# Patient Record
Sex: Female | Born: 1975 | Race: White | Hispanic: No | Marital: Married | Smoking: Former smoker
Health system: Southern US, Community
[De-identification: ages and names within clinical notes are randomized; demographics above are authoritative.]

## PROBLEM LIST (undated history)

## (undated) DIAGNOSIS — R0902 Hypoxemia: Secondary | ICD-10-CM

## (undated) DIAGNOSIS — F32A Depression, unspecified: Secondary | ICD-10-CM

## (undated) DIAGNOSIS — I10 Essential (primary) hypertension: Secondary | ICD-10-CM

## (undated) DIAGNOSIS — E119 Type 2 diabetes mellitus without complications: Secondary | ICD-10-CM

## (undated) DIAGNOSIS — I509 Heart failure, unspecified: Secondary | ICD-10-CM

## (undated) DIAGNOSIS — F329 Major depressive disorder, single episode, unspecified: Secondary | ICD-10-CM

## (undated) DIAGNOSIS — F419 Anxiety disorder, unspecified: Secondary | ICD-10-CM

## (undated) DIAGNOSIS — G4730 Sleep apnea, unspecified: Secondary | ICD-10-CM

## (undated) HISTORY — PX: CHOLECYSTECTOMY: SHX55

## (undated) HISTORY — PX: TUBAL LIGATION: SHX77

## (undated) HISTORY — PX: INCONTINENCE SURGERY: SHX676

## (undated) HISTORY — DX: Hypoxemia: R09.02

## (undated) HISTORY — DX: Sleep apnea, unspecified: G47.30

## (undated) HISTORY — PX: OOPHORECTOMY: SHX86

---

## 2003-10-06 ENCOUNTER — Other Ambulatory Visit: Payer: Self-pay

## 2004-08-11 ENCOUNTER — Emergency Department: Payer: Self-pay | Admitting: Emergency Medicine

## 2004-08-18 ENCOUNTER — Emergency Department: Payer: Self-pay | Admitting: Emergency Medicine

## 2005-03-06 ENCOUNTER — Emergency Department: Payer: Self-pay | Admitting: Emergency Medicine

## 2006-10-16 ENCOUNTER — Other Ambulatory Visit: Payer: Self-pay

## 2006-10-16 ENCOUNTER — Emergency Department: Payer: Self-pay | Admitting: Emergency Medicine

## 2006-11-17 ENCOUNTER — Emergency Department: Payer: Self-pay | Admitting: Emergency Medicine

## 2007-04-25 ENCOUNTER — Emergency Department: Payer: Self-pay | Admitting: Emergency Medicine

## 2007-04-25 ENCOUNTER — Other Ambulatory Visit: Payer: Self-pay

## 2007-04-28 ENCOUNTER — Emergency Department: Payer: Self-pay | Admitting: Emergency Medicine

## 2007-08-31 ENCOUNTER — Emergency Department: Payer: Self-pay | Admitting: Emergency Medicine

## 2008-01-22 ENCOUNTER — Emergency Department: Payer: Self-pay | Admitting: Emergency Medicine

## 2008-05-02 ENCOUNTER — Emergency Department: Payer: Self-pay | Admitting: Emergency Medicine

## 2008-05-11 ENCOUNTER — Emergency Department: Payer: Self-pay | Admitting: Emergency Medicine

## 2008-09-21 IMAGING — CT CT CHEST W/ CM
2 series · 16 of 31 positions shown, 20 images · non-contrast
Comparison: none

REASON FOR EXAM: chest pain   cough   [HOSPITAL]
COMMENTS:  LMP: Now

PROCEDURE:     CT  - CT CHEST (FOR PE) W  - October 17, 2006  [DATE]
RESULT:     Emergent chest CT is performed for chest pain and elevated
D.dimer.

[Series 6: soft tissue · axial · 0.86mm/px · z∈[-538,-442]mm · 3 of 103 slices shown]
[im 8/103  mediastinal]
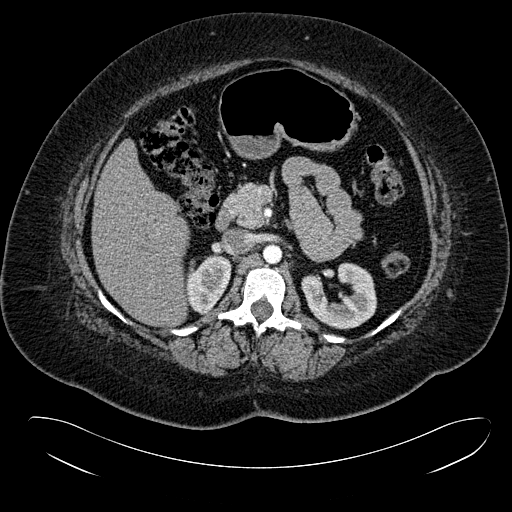
[im 24/103  mediastinal]
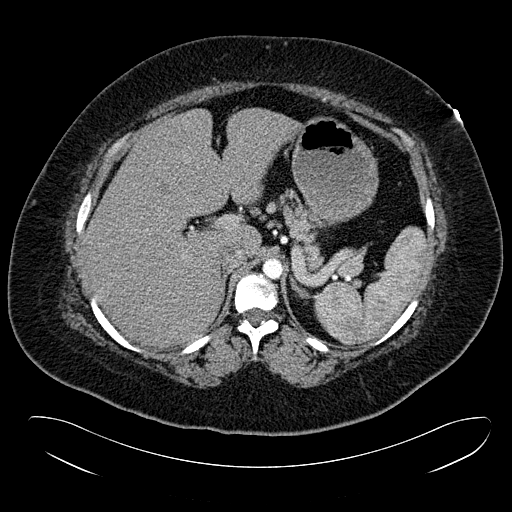
[im 40/103  mediastinal]
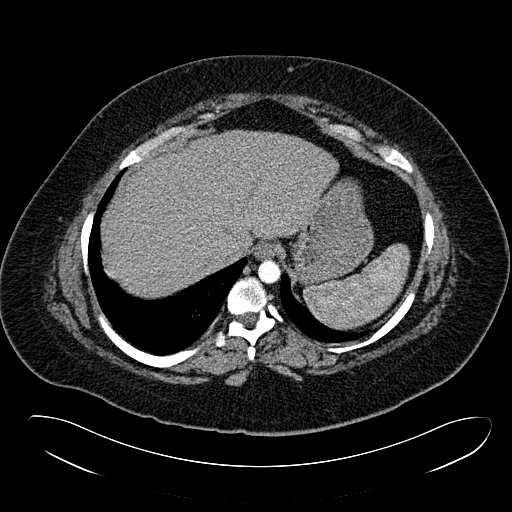

[Series 7: lung windows · axial · 0.86mm/px · z∈[-528,-276]mm · 13 of 100 slices shown, 17 images]
[im 8/100  mediastinal]
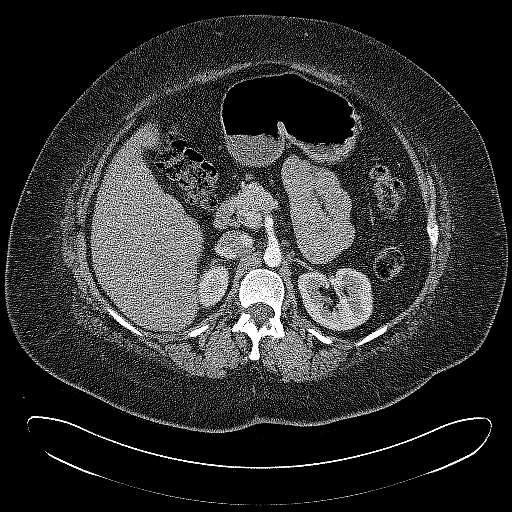
[im 8/100  lung]
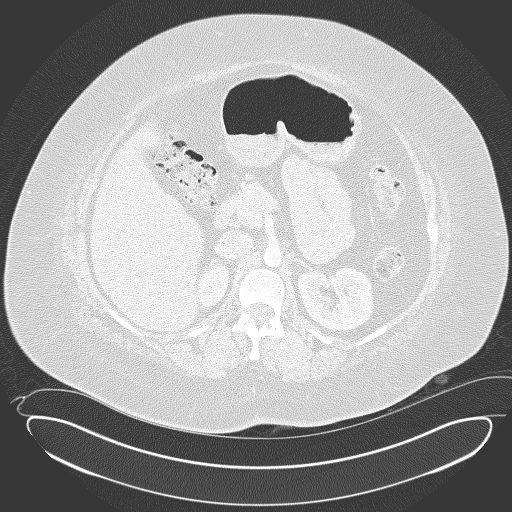
[im 16/100  lung]
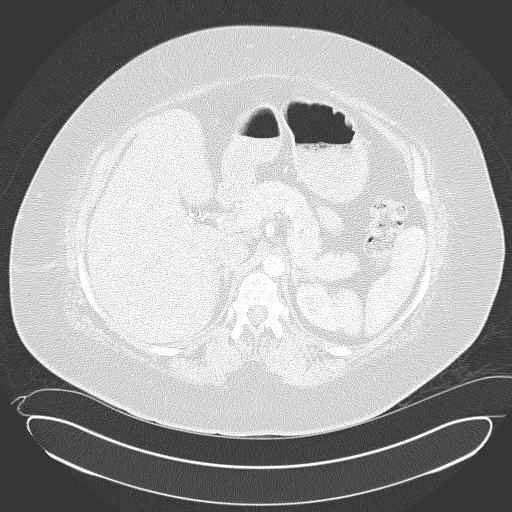
[im 23/100  lung]
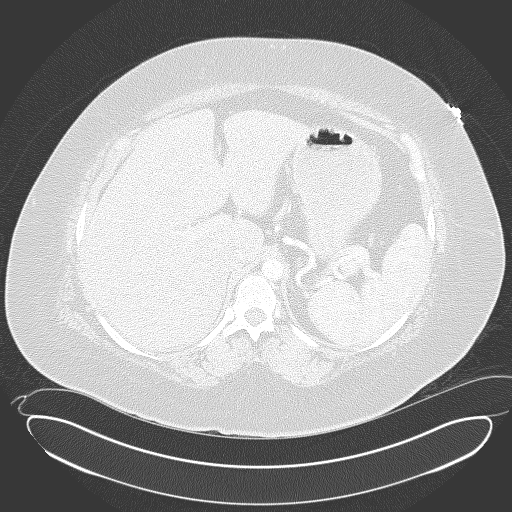
[im 31/100  lung]
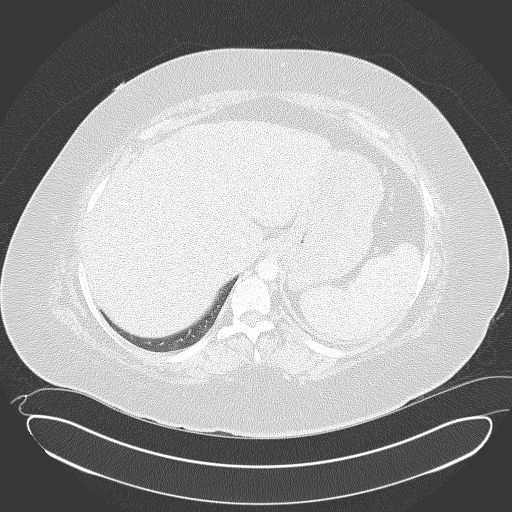
[im 39/100  mediastinal]
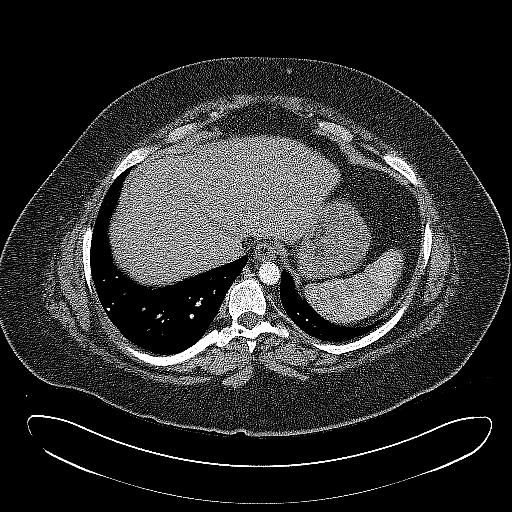
[im 39/100  lung]
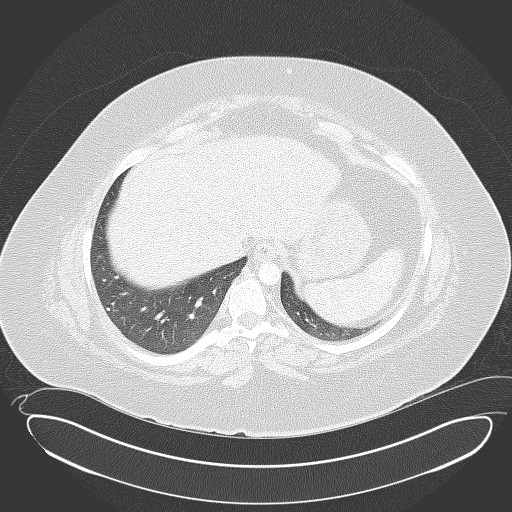
[im 46/100  lung]
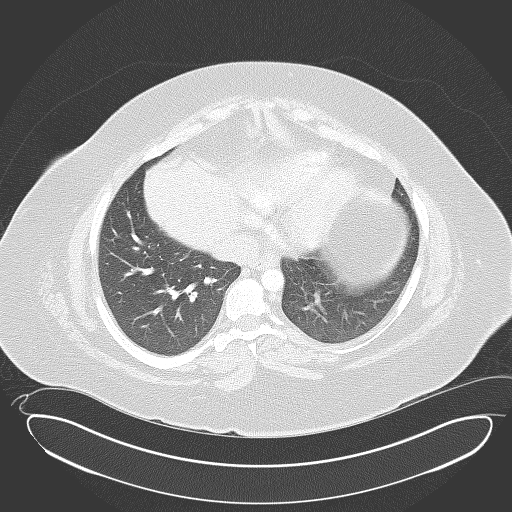
[im 50/100  lung]
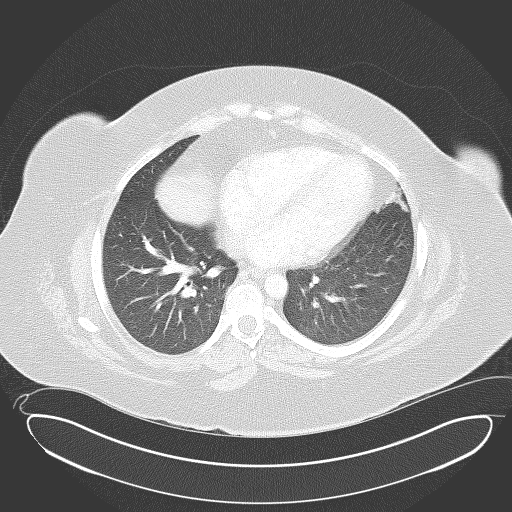
[im 54/100  lung]
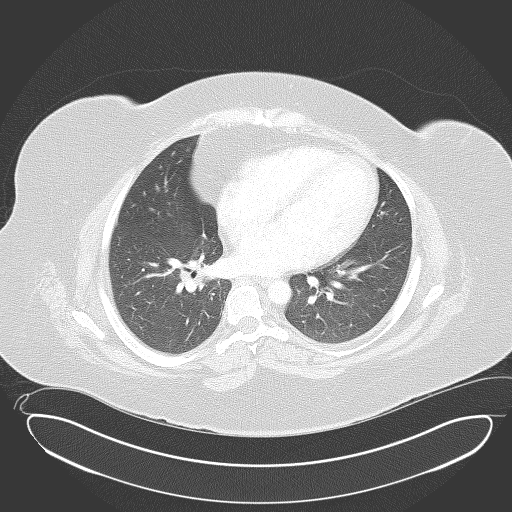
[im 61/100  mediastinal]
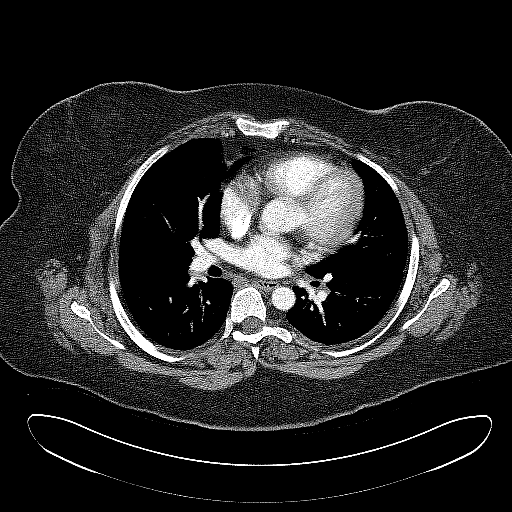
[im 61/100  lung]
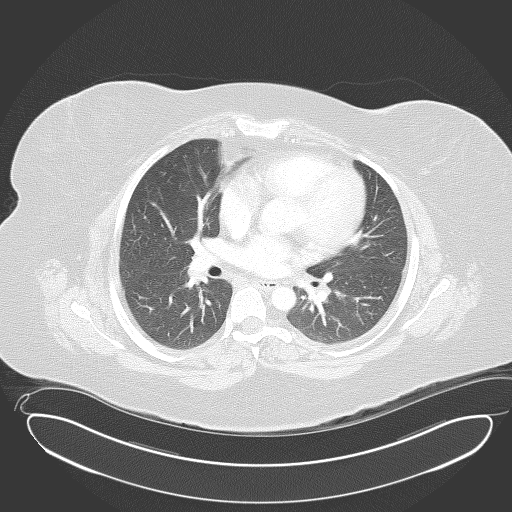
[im 69/100  lung]
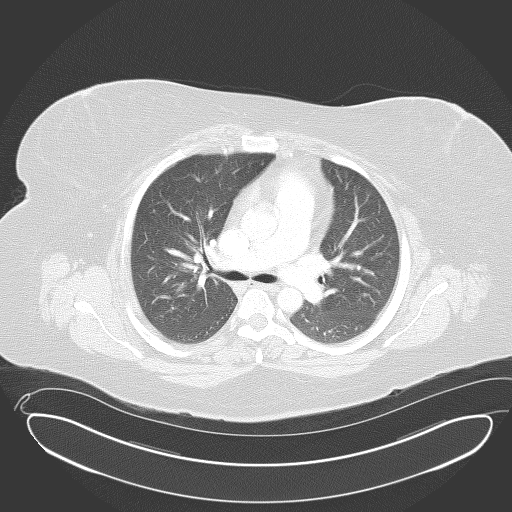
[im 77/100  lung]
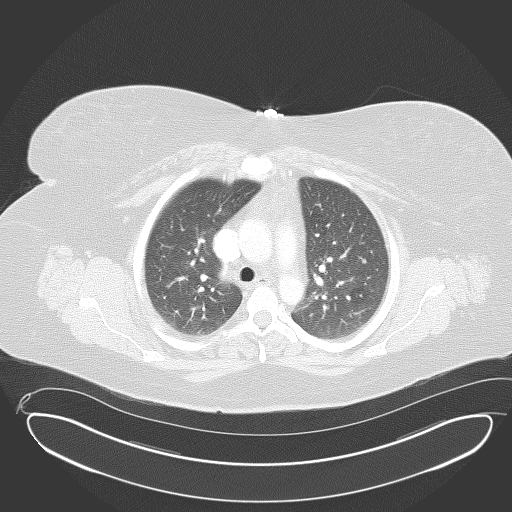
[im 84/100  lung]
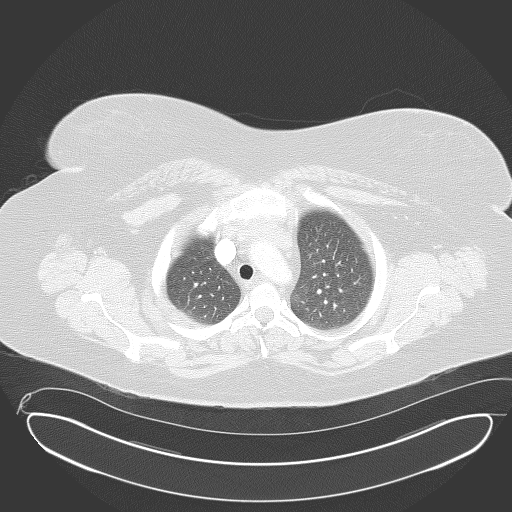
[im 92/100  mediastinal]
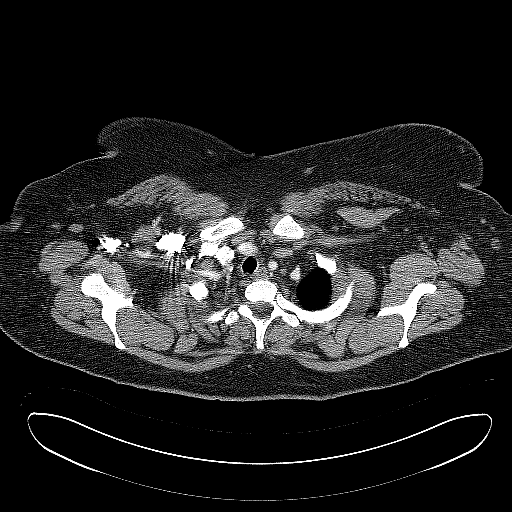
[im 92/100  lung]
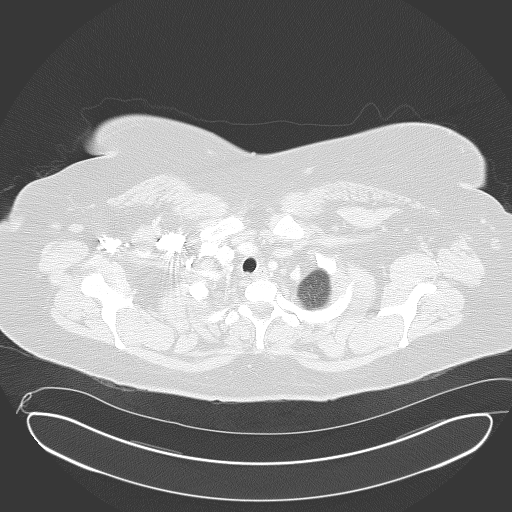

[16 of 31 positions shown; findings below may reference images not displayed]

FINDINGS: The bolus of contrast is noted within the pulmonary arteries.  No
filling defects are identified to suggest pulmonary emboli.  No mediastinal
masses are noted.  The thoracic aorta appears intact. On the lung windows
settings the lung fields are clear with no effusions.
IMPRESSION: 1)No pulmonary embolus identified.

The report was faxed to the Emergency Room.

## 2008-11-17 ENCOUNTER — Emergency Department: Payer: Self-pay | Admitting: Emergency Medicine

## 2009-03-17 ENCOUNTER — Emergency Department: Payer: Self-pay | Admitting: Emergency Medicine

## 2010-02-28 ENCOUNTER — Emergency Department: Payer: Self-pay | Admitting: Emergency Medicine

## 2010-03-07 ENCOUNTER — Emergency Department: Payer: Self-pay | Admitting: Emergency Medicine

## 2010-04-01 ENCOUNTER — Emergency Department: Payer: Self-pay | Admitting: Emergency Medicine

## 2010-10-10 ENCOUNTER — Ambulatory Visit: Payer: Self-pay | Admitting: Obstetrics and Gynecology

## 2010-12-01 ENCOUNTER — Ambulatory Visit: Payer: Self-pay | Admitting: Obstetrics and Gynecology

## 2010-12-02 ENCOUNTER — Ambulatory Visit: Payer: Self-pay | Admitting: Obstetrics and Gynecology

## 2011-03-12 ENCOUNTER — Emergency Department: Payer: Self-pay | Admitting: Emergency Medicine

## 2011-07-06 ENCOUNTER — Emergency Department: Payer: Self-pay | Admitting: Emergency Medicine

## 2012-04-06 ENCOUNTER — Emergency Department: Payer: Self-pay | Admitting: Unknown Physician Specialty

## 2012-12-01 ENCOUNTER — Emergency Department: Payer: Self-pay | Admitting: Internal Medicine

## 2012-12-01 LAB — URINALYSIS, COMPLETE
Bacteria: NONE SEEN
Bilirubin,UR: NEGATIVE
Glucose,UR: NEGATIVE mg/dL (ref 0–75)
Nitrite: NEGATIVE
Ph: 5 (ref 4.5–8.0)
RBC,UR: 89 /HPF (ref 0–5)
Squamous Epithelial: 22

## 2012-12-01 LAB — BASIC METABOLIC PANEL
Anion Gap: 7 (ref 7–16)
BUN: 17 mg/dL (ref 7–18)
Co2: 26 mmol/L (ref 21–32)
Creatinine: 0.84 mg/dL (ref 0.60–1.30)
Glucose: 118 mg/dL — ABNORMAL HIGH (ref 65–99)
Osmolality: 276 (ref 275–301)
Sodium: 137 mmol/L (ref 136–145)

## 2012-12-01 LAB — CBC
HCT: 41.5 % (ref 35.0–47.0)
HGB: 13.9 g/dL (ref 12.0–16.0)
MCH: 28.7 pg (ref 26.0–34.0)
MCHC: 33.4 g/dL (ref 32.0–36.0)
MCV: 86 fL (ref 80–100)
Platelet: 320 10*3/uL (ref 150–440)
RBC: 4.83 10*6/uL (ref 3.80–5.20)
WBC: 14.1 10*3/uL — ABNORMAL HIGH (ref 3.6–11.0)

## 2013-04-13 ENCOUNTER — Emergency Department: Payer: Self-pay | Admitting: Emergency Medicine

## 2013-04-13 LAB — CBC
HCT: 36.3 % (ref 35.0–47.0)
HGB: 12.5 g/dL (ref 12.0–16.0)
MCH: 29.1 pg (ref 26.0–34.0)
MCHC: 34.5 g/dL (ref 32.0–36.0)
MCV: 85 fL (ref 80–100)
Platelet: 305 10*3/uL (ref 150–440)
RBC: 4.29 10*6/uL (ref 3.80–5.20)
RDW: 13.9 % (ref 11.5–14.5)
WBC: 14.7 10*3/uL — ABNORMAL HIGH (ref 3.6–11.0)

## 2013-04-13 LAB — BASIC METABOLIC PANEL
Anion Gap: 7 (ref 7–16)
Chloride: 105 mmol/L (ref 98–107)
Creatinine: 0.99 mg/dL (ref 0.60–1.30)
EGFR (Non-African Amer.): >60
Glucose: 115 mg/dL — ABNORMAL HIGH (ref 65–99)
Osmolality: 280 (ref 275–301)
Potassium: 3.7 mmol/L (ref 3.5–5.1)

## 2013-04-13 LAB — TROPONIN I
Troponin-I: <0.02 ng/mL
Troponin-I: <0.02 ng/mL

## 2013-04-13 LAB — CK TOTAL AND CKMB (NOT AT ARMC): CK-MB: <0.5 ng/mL — ABNORMAL LOW (ref 0.5–3.6)

## 2013-11-30 ENCOUNTER — Emergency Department: Payer: Self-pay | Admitting: Emergency Medicine

## 2015-07-05 ENCOUNTER — Emergency Department
Admission: EM | Admit: 2015-07-05 | Discharge: 2015-07-05 | Disposition: A | Discharge status: Home / Self Care | Payer: Medicaid Other | Attending: Emergency Medicine | Admitting: Emergency Medicine

## 2015-07-05 ENCOUNTER — Encounter: Payer: Self-pay | Admitting: *Deleted

## 2015-07-05 ENCOUNTER — Emergency Department: Payer: Medicaid Other

## 2015-07-05 DIAGNOSIS — R008 Other abnormalities of heart beat: Secondary | ICD-10-CM | POA: Insufficient documentation

## 2015-07-05 DIAGNOSIS — S99912A Unspecified injury of left ankle, initial encounter: Secondary | ICD-10-CM | POA: Diagnosis present

## 2015-07-05 DIAGNOSIS — Y998 Other external cause status: Secondary | ICD-10-CM | POA: Diagnosis not present

## 2015-07-05 DIAGNOSIS — S82892A Other fracture of left lower leg, initial encounter for closed fracture: Secondary | ICD-10-CM | POA: Diagnosis not present

## 2015-07-05 DIAGNOSIS — Y9389 Activity, other specified: Secondary | ICD-10-CM | POA: Diagnosis not present

## 2015-07-05 DIAGNOSIS — W108XXA Fall (on) (from) other stairs and steps, initial encounter: Secondary | ICD-10-CM | POA: Diagnosis not present

## 2015-07-05 DIAGNOSIS — S9002XA Contusion of left ankle, initial encounter: Secondary | ICD-10-CM | POA: Diagnosis not present

## 2015-07-05 DIAGNOSIS — Y9289 Other specified places as the place of occurrence of the external cause: Secondary | ICD-10-CM | POA: Diagnosis not present

## 2015-07-05 DIAGNOSIS — N390 Urinary tract infection, site not specified: Secondary | ICD-10-CM | POA: Insufficient documentation

## 2015-07-05 HISTORY — DX: Major depressive disorder, single episode, unspecified: F32.9

## 2015-07-05 HISTORY — DX: Depression, unspecified: F32.A

## 2015-07-05 HISTORY — DX: Anxiety disorder, unspecified: F41.9

## 2015-07-05 LAB — BASIC METABOLIC PANEL
ANION GAP: 5 (ref 5–15)
BUN: 13 mg/dL (ref 6–20)
CHLORIDE: 104 mmol/L (ref 101–111)
CO2: 26 mmol/L (ref 22–32)
Calcium: 9.3 mg/dL (ref 8.9–10.3)
Creatinine, Ser: 0.72 mg/dL (ref 0.44–1.00)
GFR calc non Af Amer: >60 mL/min (ref >60)
Glucose, Bld: 137 mg/dL — ABNORMAL HIGH (ref 65–99)
POTASSIUM: 4 mmol/L (ref 3.5–5.1)
Sodium: 135 mmol/L (ref 135–145)

## 2015-07-05 LAB — CBC
HCT: 39.8 % (ref 35.0–47.0)
Hemoglobin: 12.7 g/dL (ref 12.0–16.0)
MCH: 26.9 pg (ref 26.0–34.0)
MCHC: 32 g/dL (ref 32.0–36.0)
MCV: 84.2 fL (ref 80.0–100.0)
PLATELETS: 347 10*3/uL (ref 150–440)
RBC: 4.73 MIL/uL (ref 3.80–5.20)
RDW: 14.6 % — AB (ref 11.5–14.5)
WBC: 13.5 10*3/uL — AB (ref 3.6–11.0)

## 2015-07-05 LAB — URINALYSIS COMPLETE WITH MICROSCOPIC (ARMC ONLY)
Bilirubin Urine: NEGATIVE
Glucose, UA: NEGATIVE mg/dL
Hgb urine dipstick: NEGATIVE
Ketones, ur: NEGATIVE mg/dL
Nitrite: NEGATIVE
Protein, ur: NEGATIVE mg/dL
Specific Gravity, Urine: 1.023 (ref 1.005–1.030)
pH: 5 (ref 5.0–8.0)

## 2015-07-05 LAB — GLUCOSE, CAPILLARY: Glucose-Capillary: 87 mg/dL (ref 65–99)

## 2015-07-05 MED ORDER — DOCUSATE SODIUM 100 MG PO CAPS: ORAL_CAPSULE | ORAL | Status: DC

## 2015-07-05 MED ORDER — ONDANSETRON HCL 4 MG PO TABS: ORAL_TABLET | ORAL | Status: DC

## 2015-07-05 MED ORDER — LIDOCAINE HCL 1 % IJ SOLN
2.1000 mL | Freq: Once | INTRAMUSCULAR | Status: AC
  Filled 2015-07-05: qty 2.1

## 2015-07-05 MED ORDER — CEPHALEXIN 500 MG PO CAPS: 500.0000 mg | ORAL_CAPSULE | Freq: Three times a day (TID) | ORAL | Status: DC

## 2015-07-05 MED ORDER — CEFTRIAXONE SODIUM 1 G IJ SOLR
1.0000 g | Freq: Once | INTRAMUSCULAR | Status: AC
  Administered 2015-07-05: 1 g via INTRAMUSCULAR
  Filled 2015-07-05: qty 10

## 2015-07-05 MED ORDER — OXYCODONE-ACETAMINOPHEN 5-325 MG PO TABS: 1.0000 | ORAL_TABLET | ORAL | Status: DC | PRN

## 2015-07-05 MED ORDER — LIDOCAINE HCL (PF) 1 % IJ SOLN
INTRAMUSCULAR | Status: AC
  Administered 2015-07-05: 2 mL
  Filled 2015-07-05: qty 5

## 2015-07-05 MED ORDER — OXYCODONE-ACETAMINOPHEN 5-325 MG PO TABS
2.0000 | ORAL_TABLET | Freq: Once | ORAL | Status: AC
  Administered 2015-07-05: 2 via ORAL
  Filled 2015-07-05: qty 2

## 2015-07-05 NOTE — ED Provider Notes (Signed)
[facility] Emergency Department Provider Note  ____________________________________________  Time seen: Approximately 5:23 PM  I have reviewed the triage vital signs and the nursing notes.   HISTORY  Chief Complaint Fall    HPI Krista Mcgee is a 39 y.o. female with no significant past medical history other than obesity, anxiety, depression who presents with pain in her left ankle after a fall earlier today.  She fell down about 3 stairs.  She states that she became lightheaded and felt some "fluttering" in her chest that she quite frequently feels.  She denies shortness of breath, chest pain, abdominal pain, dysuria, fever/chills.  She is not sure if she passed out but her daughter was present and saw the fall and states that her mother did not lose consciousness.  She does not complain of any headache or neck pain at this time.  She has no focal neurological deficits and is only complaining of left lateral ankle pain.  There is some swelling and bruising at the site and it is painful for her to attempt to bear any weight or move the ankle.   Past Medical History  Diagnosis Date  . Anxiety   . Depression     There are no active problems to display for this patient.   Past Surgical History  Procedure Laterality Date  . Cholecystectomy    . Oophorectomy      Current Outpatient Rx  Name  Route  Sig  Dispense  Refill  . cephALEXin (KEFLEX) 500 MG capsule   Oral   Take 1 capsule (500 mg total) by mouth 3 (three) times daily.   36 capsule   0   . docusate sodium (COLACE) 100 MG capsule      Take 1 tablet once or twice daily as needed for constipation while taking narcotic pain medicine   30 capsule   0   . ondansetron (ZOFRAN) 4 MG tablet      Take 1-2 tabs by mouth every 8 hours as needed for nausea/vomiting   30 tablet   0   . oxyCODONE-acetaminophen (ROXICET) 5-325 MG tablet   Oral   Take 1-2 tablets by mouth every 4 (four) hours as  needed for severe pain.   20 tablet   0     Allergies Review of patient's allergies indicates no known allergies.  History reviewed. No pertinent family history.  Social History Social History  Substance Use Topics  . Smoking status: Never Smoker   . Smokeless tobacco: None  . Alcohol Use: No    Review of Systems Constitutional: No fever/chills Eyes: No visual changes. ENT: No sore throat. Cardiovascular: Denies chest pain.  Some fluttering in her chest which she feels frequently and describes as "butterflies" Respiratory: Denies shortness of breath. Gastrointestinal: No abdominal pain.  No nausea, no vomiting.  No diarrhea.  No constipation. Genitourinary: Negative for dysuria. Musculoskeletal: Negative for back pain.  No neck pain.  Pain and swelling to the left lateral ankle Skin: Negative for rash. Neurological: Negative for headaches, focal weakness or numbness.  10-point ROS otherwise negative.  ____________________________________________   PHYSICAL EXAM:  VITAL SIGNS: ED Triage Vitals  Enc Vitals Group     BP 07/05/15 1604 161/79 mmHg     Pulse Rate 07/05/15 1604 108     Resp 07/05/15 1604 18     Temp 07/05/15 1604 98.8 F (37.1 C)     Temp Source 07/05/15 1604 Oral     SpO2 07/05/15 1604  95 %     Weight 07/05/15 1604 370 lb (167.831 kg)     Height 07/05/15 1604 5\' 7"  (1.702 m)     Head Cir --      Peak Flow --      Pain Score 07/05/15 1601 5     Pain Loc --      Pain Edu? --      Excl. in [place]? --     Constitutional: Alert and oriented. Well appearing and in no acute distress. Eyes: Conjunctivae are normal. PERRL. EOMI. Head: Atraumatic. Nose: No congestion/rhinnorhea. Mouth/Throat: Mucous membranes are moist.  Oropharynx non-erythematous. Neck: No stridor.  No cervical spine tenderness to palpation. Cardiovascular: Normal rate, regular rhythm. Grossly normal heart sounds.  Good peripheral circulation. Respiratory: Normal respiratory effort.  No  retractions. Lungs CTAB. Gastrointestinal: Soft and nontender. No distention. No abdominal bruits. No CVA tenderness. Musculoskeletal: Pelvis stable.  No upper extremity injuries or pain/tenderness.  No tenderness to palpation of any of the left lower extremity until reaching the left lateral malleolus which has an obvious effusion, ecchymosis, and tenderness to palpation or with active and passive movement of the joint.  No open wounds.  Neurovascularly intact distally. Neurologic:  Normal speech and language. No gross focal neurologic deficits are appreciated.  Skin:  Skin is warm, dry and intact. No rash noted. Psychiatric: Mood and affect are normal. Speech and behavior are normal.  ____________________________________________   LABS (all labs ordered are listed, but only abnormal results are displayed)  Labs Reviewed  BASIC METABOLIC PANEL - Abnormal; Notable for the following:    Glucose, Bld 137 (*)    All other components within normal limits  CBC - Abnormal; Notable for the following:    WBC 13.5 (*)    RDW 14.6 (*)    All other components within normal limits  URINALYSIS COMPLETEWITH MICROSCOPIC ([facility] ONLY) - Abnormal; Notable for the following:    Color, Urine YELLOW (*)    APPearance CLOUDY (*)    Leukocytes, UA 3+ (*)    Bacteria, UA FEW (*)    Squamous Epithelial / LPF 6-30 (*)    All other components within normal limits  URINE CULTURE  GLUCOSE, CAPILLARY  CBG MONITORING, ED   ____________________________________________  EKG  ED ECG REPORT I, Josselin Gaulin, the attending physician, personally viewed and interpreted this ECG.  Date: 07/05/2015 EKG Time: 16:06 Rate: 108 Rhythm: Mild sinus tachycardia QRS Axis: normal Intervals: normal ST/T Wave abnormalities: normal Conduction Disutrbances: none Narrative Interpretation: unremarkable  ____________________________________________  RADIOLOGY   Dg Ankle Complete Left  07/05/2015  CLINICAL DATA:  Pt  fell down three steps today rolling left ankle, c/o lateral left ankle pain/swelling, no prev surgery. EXAM: LEFT ANKLE COMPLETE - 3+ VIEW COMPARISON:  None. FINDINGS: Small sliver of bone lies along the inferior margin of the distal fibula which may reflect an acute avulsion fracture or be chronic. No other evidence of a fracture. Ankle mortise is normally spaced and aligned. There is diffuse soft tissue swelling most evident laterally. There are small plantar and dorsal calcaneal spurs. IMPRESSION: 1. Possible acute, small, nondisplaced avulsion fracture from the inferior margin of the distal fibula. No other evidence of a fracture. No dislocation. Electronically Signed   By: Lajean Manes M.D.   On: 07/05/2015 17:39    ____________________________________________   PROCEDURES  Procedure(s) performed: splint, see procedure note(s).   SPLINT APPLICATION  Authorized by: Hinda Kehr Consent: Verbal consent obtained. Risks and benefits: risks, benefits  and alternatives were discussed Consent given by: patient Splint applied by: ED technician Location details: LLE Splint type: posterior short leg Supplies used: orthoglass Post-procedure: The splinted body part was neurovascularly unchanged following the procedure. Patient tolerance: Patient tolerated the procedure well with no immediate complications.     Critical Care performed: No ____________________________________________   INITIAL IMPRESSION / ASSESSMENT AND PLAN / ED COURSE  Pertinent labs & imaging results that were available during my care of the patient were reviewed by me and considered in my medical decision making (see chart for details).  The patient is well-appearing and in no acute distress.  She has slight tachycardia and her lab workup is notable for a significant urinary tract infection with too numerous to count whites as well as a mild leukocytosis.  Given how well-appearing she is at this time with normal  mental status and otherwise stable vitals, I will treat her with Rocephin IM to start treatment of her UTI, send a urine culture, and obtain x-rays for her ankle.  (Note that documentation was delayed due to multiple ED patients requiring immediate care.)   Probable avulsion fracture of distal fibula.  Patient is actually able to bear weight on the ankle.  I placed her in a splint, told to be nonweightbearing, gave crutches, treated the UTI, advised close outpatient follow-up with orthopedics.  I gave her my usual customary return precautions.  ____________________________________________  FINAL CLINICAL IMPRESSION(S) / ED DIAGNOSES  Final diagnoses:  UTI (lower urinary tract infection)  Closed left ankle fracture, initial encounter  Fall (on) (from) other stairs and steps, initial encounter      NEW MEDICATIONS STARTED DURING THIS VISIT:  Discharge Medication List as of 07/05/2015  7:32 PM    START taking these medications   Details  cephALEXin (KEFLEX) 500 MG capsule Take 1 capsule (500 mg total) by mouth 3 (three) times daily., Starting 07/05/2015, Until Discontinued, Print    docusate sodium (COLACE) 100 MG capsule Take 1 tablet once or twice daily as needed for constipation while taking narcotic pain medicine, Print    ondansetron (ZOFRAN) 4 MG tablet Take 1-2 tabs by mouth every 8 hours as needed for nausea/vomiting, Print    oxyCODONE-acetaminophen (ROXICET) 5-325 MG tablet Take 1-2 tablets by mouth every 4 (four) hours as needed for severe pain., Starting 07/05/2015, Until Discontinued, Print         Hinda Kehr, MD 07/06/15 [phone]

## 2015-07-05 NOTE — ED Notes (Signed)
Pt reports that she fell down 3 stairs at home today.  Denies hitting her head.  States that she is unsure if she passed out or not.  Reports left ankle pain since that time.

## 2015-07-05 NOTE — Discharge Instructions (Signed)
As we discussed, you may have a small fracture at the very bottom of your leg/outside portion of your ankle.  Please leave your splint in place and follow up with Dr. Rudene Christians or one of his partners next week in clinic.  He may order some repeat x-rays at that time.  Please do not bear weight on your leg and use the crutches for ambulation.  Also please take the full course of prescribed antibiotics for your urinary tract infection.  Return to the emergency department with new or worsening symptoms that concern you.  Take Percocet as prescribed for severe pain. Do not drink alcohol, drive or participate in any other potentially dangerous activities while taking this medication as it may make you sleepy. Do not take this medication with any other sedating medications, either prescription or over-the-counter. If you were prescribed Percocet or Vicodin, do not take these with acetaminophen (Tylenol) as it is already contained within these medications.   This medication is an opiate (or narcotic) pain medication and can be habit forming.  Use it as little as possible to achieve adequate pain control.  Do not use or use it with extreme caution if you have a history of opiate abuse or dependence.  If you are on a pain contract with your primary care doctor or a pain specialist, be sure to let them know you were prescribed this medication today from the [facility] Emergency Department.  This medication is intended for your use only - do not give any to anyone else and keep it in a secure place where nobody else, especially children, have access to it.  It will also cause or worsen constipation, so you may want to consider taking an over-the-counter stool softener while you are taking this medication.   Fibular Fracture With Rehab The fibula is the smaller of the two lower leg bones and is vulnerable to breaks (fracture). Fibular fractures may go fully through the bone (complete) or partially (incomplete). The  bone fragments are rarely out of alignment (displaced fracture). Fibula fractures may occur anywhere along the bone. However, this document only discusses fractures that do not involve a leg joint. Fibular fractures are not often a severe injury because the bone supports only about 17% of the body weight. SYMPTOMS   Moderate to severe pain in the lower leg.  Tenderness and swelling in the leg or calf.  Bleeding and/or bruising (contusion) in the leg.  Inability to bear weight on the injured extremity.  Visible deformity, if the fracture is displaced.  Numbness and coldness in the leg and foot, beyond the fracture site, if blood supply is impaired. CAUSES  Fractures occur when a force is placed on the bone that is greater than it can withstand. Common causes of fibular fracture include:  Direct hit (trauma) (i.e., hockey or lacrosse check to the lower leg).  Stress fracture (weakening of the bone from repeated stress).  Indirect injury, caused by twisting, turning quickly, or violent muscle contraction. RISK INCREASES WITH:  Contact sports (i.e., football, soccer, lacrosse, hockey).  Sports that can cause twisted ankle injury (i.e., skiing, basketball).  Bony abnormalities (i.e., osteoporosis or bone tumors).  Metabolism disorders, hormone problems, and nutrition deficiency and disorders (i.e., anorexia and bulimia).  Poor strength and flexibility. PREVENTION   Warm up and stretch properly before activity.  Maintain physical fitness:  Strength, flexibility, and endurance.  Cardiovascular fitness.  Wear properly fitted and padded protective equipment (i.e., shin guards for soccer). PROGNOSIS  If  treated properly, fibular fractures usually heal in 4 to 6 weeks.  RELATED COMPLICATIONS   Failure of bone to heal (nonunion).  Bone heals in a poor position (malunion).  Increased pressure inside the leg (compartment syndrome) due to injury that disrupts the blood supply to  the leg and foot and injures the nerves and muscles (uncommon).  Shortening of the injured bones.  Hindrance of normal bone growth in children.  Risks of surgery: infection, bleeding, injury to nerves (numbness, weakness, paralysis), need for further surgery.  Longer healing time if activity is resumed too soon. TREATMENT Treatment first involves ice, medicine, and elevation of the leg to reduce pain and inflammation. People with fibular fractures are advised to walk using crutches. A brace or walking boot may be given to restrain the injured leg and allow for healing. Sometimes, surgery is needed to place a rod, plate, or screws in the bones in order to fix the fracture. After surgery, the leg is restrained. After restraint (with or without surgery), it is important to complete strengthening and stretching exercises to regain strength and a full range of motion. Exercises may be completed at home or with a therapist. MEDICATION   If pain medicine is needed, nonsteroidal anti-inflammatory medicines (aspirin and ibuprofen), or other minor pain relievers (acetaminophen), are often advised.  Do not take pain medicine for 7 days before surgery.  Prescription pain relievers may be given if your health care provider thinks they are needed. Use only as directed and only as much as you need. SEEK MEDICAL CARE IF:  Symptoms get worse or do not improve in 2 weeks, despite treatment.  The following occur after restraint or surgery. (Report any of these signs immediately):  Swelling above or below the fracture site.  Severe, persistent pain.  Blue or gray skin below the fracture site, especially under the toenails. Numbness or loss of feeling below the fracture site.  New, unexplained symptoms develop. (Drugs used in treatment may produce side effects.)   Tibial and Fibular Fracture, Adult Tibial and fibular fracture is a break in the bones of your lower leg (tibia and fibula). The tibia is the  larger of these two bones. The fibula is the smaller of the two bones. It is on the outer side of your leg.  CAUSES  Low-energy injuries, such as a fall from ground level.  High-energy injuries, such as motor vehicle injuries, gunshot wounds, or high-speed sports collisions. RISK FACTORS  Jumping activities.  Repetitive stress, such as long-distance running.  Participation in sports.  Osteoporosis.  Advanced age. SIGNS AND SYMPTOMS  Pain.  Swelling.  Inability to put weight on your injured leg.  Bone deformities at the site of your injury.  Bruising. DIAGNOSIS  Tibial and fibular fractures are diagnosed with the use of X-ray exams. TREATMENT  If you have a simple fracture of these two bones, they can be treated with simple immobilization. A cast or splint will be used on your leg to keep it from moving while it heals. Then you can begin range-of-motion exercises to regain your knee motion. HOME CARE INSTRUCTIONS   Apply ice to your leg:  Put ice in a plastic bag.  Place a towel between your skin and the bag.  Leave the ice on for 20 minutes, 2-3 times a day.  If you have a plaster or fiberglass cast:  Do not try to scratch the skin under the cast using sharp or pointed objects.  Check the skin around the cast  every day. You may put lotion on any red or sore areas.  Keep your cast dry and clean.  If you have a plaster splint:  Wear the splint as directed.  You may loosen the elastic around the splint if your toes become numb, tingle, or turn cold or blue.  Do not put pressure on any part of your cast or splint until it is fully hardened, because it may deform.  Your cast or splint can be protected during bathing with a plastic bag. Do not lower the cast or splint into water.  Use crutches as directed.  Only take over-the-counter or prescription medicines for pain, discomfort, or fever as directed by your health care provider.  Follow all instructions  given to you by your health care provider.  Make and keep all follow-up appointments. SEEK MEDICAL CARE IF:  Your pain is becoming worse rather than better or is not controlled with medicines.  You have increased swelling or redness in the foot.  You begin to lose feeling in your foot or toes. SEEK IMMEDIATE MEDICAL CARE IF:  You develop a cold or blue foot or toes on the injured side.  You develop severe pain in your injured leg, especially if the pain is increased with movement of your toes. MAKE SURE YOU:  Understand these instructions.  Will watch your condition.  Will get help right away if you are not doing well or get worse.   This information is not intended to replace advice given to you by your health care provider. Make sure you discuss any questions you have with your health care provider.   Document Released: 05/02/2002 Document Revised: 12/25/2014 Document Reviewed: 03/22/2013 Elsevier Interactive Patient Education 2016 [employer]   Urinary Tract Infection Urinary tract infections (UTIs) can develop anywhere along your urinary tract. Your urinary tract is your body's drainage system for removing wastes and extra water. Your urinary tract includes two kidneys, two ureters, a bladder, and a urethra. Your kidneys are a pair of bean-shaped organs. Each kidney is about the size of your fist. They are located below your ribs, one on each side of your spine. CAUSES Infections are caused by microbes, which are microscopic organisms, including fungi, viruses, and bacteria. These organisms are so small that they can only be seen through a microscope. Bacteria are the microbes that most commonly cause UTIs. SYMPTOMS  Symptoms of UTIs may vary by age and gender of the patient and by the location of the infection. Symptoms in young women typically include a frequent and intense urge to urinate and a painful, burning feeling in the bladder or urethra during urination. Older  women and men are more likely to be tired, shaky, and weak and have muscle aches and abdominal pain. A fever may mean the infection is in your kidneys. Other symptoms of a kidney infection include pain in your back or sides below the ribs, nausea, and vomiting. DIAGNOSIS To diagnose a UTI, your caregiver will ask you about your symptoms. Your caregiver will also ask you to provide a urine sample. The urine sample will be tested for bacteria and white blood cells. White blood cells are made by your body to help fight infection. TREATMENT  Typically, UTIs can be treated with medication. Because most UTIs are caused by a bacterial infection, they usually can be treated with the use of antibiotics. The choice of antibiotic and length of treatment depend on your symptoms and the type of bacteria causing your infection. HOME  CARE INSTRUCTIONS  If you were prescribed antibiotics, take them exactly as your caregiver instructs you. Finish the medication even if you feel better after you have only taken some of the medication.  Drink enough water and fluids to keep your urine clear or pale yellow.  Avoid caffeine, tea, and carbonated beverages. They tend to irritate your bladder.  Empty your bladder often. Avoid holding urine for long periods of time.  Empty your bladder before and after sexual intercourse.  After a bowel movement, women should cleanse from front to back. Use each tissue only once. SEEK MEDICAL CARE IF:   You have back pain.  You develop a fever.  Your symptoms do not begin to resolve within 3 days. SEEK IMMEDIATE MEDICAL CARE IF:   You have severe back pain or lower abdominal pain.  You develop chills.  You have nausea or vomiting.  You have continued burning or discomfort with urination. MAKE SURE YOU:   Understand these instructions.  Will watch your condition.  Will get help right away if you are not doing well or get worse.   This information is not intended to  replace advice given to you by your health care provider. Make sure you discuss any questions you have with your health care provider.   Document Released: 05/20/2005 Document Revised: 05/01/2015 Document Reviewed: 09/18/2011 Elsevier Interactive Patient Education 2016 [place] or Splint Care Casts and splints support injured limbs and keep bones from moving while they heal. It is important to care for your cast or splint at home.  HOME CARE INSTRUCTIONS  Keep the cast or splint uncovered during the drying period. It can take 24 to 48 hours to dry if it is made of plaster. A fiberglass cast will dry in less than 1 hour.  Do not rest the cast on anything harder than a pillow for the first 24 hours.  Do not put weight on your injured limb or apply pressure to the cast until your health care provider gives you permission.  Keep the cast or splint dry. Wet casts or splints can lose their shape and may not support the limb as well. A wet cast that has lost its shape can also create harmful pressure on your skin when it dries. Also, wet skin can become infected.  Cover the cast or splint with a plastic bag when bathing or when out in the rain or snow. If the cast is on the trunk of the body, take sponge baths until the cast is removed.  If your cast does become wet, dry it with a towel or a blow dryer on the cool setting only.  Keep your cast or splint clean. Soiled casts may be wiped with a moistened cloth.  Do not place any hard or soft foreign objects under your cast or splint, such as cotton, toilet paper, lotion, or powder.  Do not try to scratch the skin under the cast with any object. The object could get stuck inside the cast. Also, scratching could lead to an infection. If itching is a problem, use a blow dryer on a cool setting to relieve discomfort.  Do not trim or cut your cast or remove padding from inside of it.  Exercise all joints next to the injury that are not  immobilized by the cast or splint. For example, if you have a long leg cast, exercise the hip joint and toes. If you have an arm cast or splint, exercise the shoulder, elbow,  thumb, and fingers.  Elevate your injured arm or leg on 1 or 2 pillows for the first 1 to 3 days to decrease swelling and pain.It is best if you can comfortably elevate your cast so it is higher than your heart. SEEK MEDICAL CARE IF:   Your cast or splint cracks.  Your cast or splint is too tight or too loose.  You have unbearable itching inside the cast.  Your cast becomes wet or develops a soft spot or area.  You have a bad smell coming from inside your cast.  You get an object stuck under your cast.  Your skin around the cast becomes red or raw.  You have new pain or worsening pain after the cast has been applied. SEEK IMMEDIATE MEDICAL CARE IF:   You have fluid leaking through the cast.  You are unable to move your fingers or toes.  You have discolored (blue or white), cool, painful, or very swollen fingers or toes beyond the cast.  You have tingling or numbness around the injured area.  You have severe pain or pressure under the cast.  You have any difficulty with your breathing or have shortness of breath.  You have chest pain.   This information is not intended to replace advice given to you by your health care provider. Make sure you discuss any questions you have with your health care provider.   Document Released: 08/07/2000 Document Revised: 05/31/2013 Document Reviewed: 02/16/2013 Elsevier Interactive Patient Education [employer].

## 2015-07-07 LAB — URINE CULTURE: SPECIAL REQUESTS: NORMAL

## 2015-07-29 ENCOUNTER — Encounter: Payer: Self-pay | Admitting: Family Medicine

## 2015-07-29 ENCOUNTER — Ambulatory Visit (INDEPENDENT_AMBULATORY_CARE_PROVIDER_SITE_OTHER): Payer: Medicaid Other | Admitting: Family Medicine

## 2015-07-29 ENCOUNTER — Encounter (INDEPENDENT_AMBULATORY_CARE_PROVIDER_SITE_OTHER): Payer: Self-pay

## 2015-07-29 VITALS — BP 122/76 | HR 125 | Temp 98.1°F | Resp 18 | Wt 338.4 lb

## 2015-07-29 DIAGNOSIS — R5383 Other fatigue: Secondary | ICD-10-CM | POA: Diagnosis not present

## 2015-07-29 DIAGNOSIS — R0683 Snoring: Secondary | ICD-10-CM | POA: Diagnosis not present

## 2015-07-29 DIAGNOSIS — F3342 Major depressive disorder, recurrent, in full remission: Secondary | ICD-10-CM | POA: Insufficient documentation

## 2015-07-29 DIAGNOSIS — Z13220 Encounter for screening for lipoid disorders: Secondary | ICD-10-CM

## 2015-07-29 DIAGNOSIS — G4700 Insomnia, unspecified: Secondary | ICD-10-CM | POA: Insufficient documentation

## 2015-07-29 DIAGNOSIS — Z136 Encounter for screening for cardiovascular disorders: Secondary | ICD-10-CM | POA: Diagnosis not present

## 2015-07-29 DIAGNOSIS — IMO0001 Reserved for inherently not codable concepts without codable children: Secondary | ICD-10-CM | POA: Insufficient documentation

## 2015-07-29 DIAGNOSIS — Z6841 Body Mass Index (BMI) 40.0 and over, adult: Secondary | ICD-10-CM

## 2015-07-29 NOTE — Progress Notes (Signed)
Name: Krista Mcgee   MRN: [id number]    DOB: [birth year]   Date:07/29/2015       Progress Note  Subjective  Chief Complaint  Chief Complaint  Patient presents with  . Establish Care    patient has not had a PCP in about 37yr. She did have a gyn. she has a mental health doctor that she has regular appts every 3 months.    HPI  Krista GUNNOEis a 39y.o. female here today to transition care of medical needs to a primary care provider. She reports concern regarding her low energy, weight gain over the years, which are related to her poor sleep and depression. This is the most she has weighed, today 338 lbs, she usually is around 250lbs. But in the past 2 years she has gained more. She has 6 children, youngest biological child is 138years old. Does have another younger adopted child at home. Not working outside of home. She does all of the cooking at home. Does light breakfast, largest meal is dinner, working on cutting down portions. Eating earlier than later. Avoiding fried foods. Has a treadmill at home, tries to get on it every day, if weather better will be outside with the kids. The weight gain has limited her activity. When on treadmill tries to walk 20 mins but is out of breath so quits. Does report snoring at night. Noting some heaviness at her neck area when laying back. Using Ambien CR 12.5 mg at night which does not work after consecutive use. Alternates with sleep water, Zzquil, unisom, melatonin. Has previous been on amitriptyline in the past as well. Can go 2 days without sleep then out of exhausting falls asleep during the day.    Past Medical History  Diagnosis Date  . Anxiety   . Depression     Patient Active Problem List   Diagnosis Date Noted  . Morbid obesity with BMI of 50.0-59.9, adult ([place] 07/29/2015  . Insomnia 07/29/2015  . Major depressive disorder, recurrent, [place] full [place] distress [place] [place] 07/29/2015    Social History  Substance Use Topics   . Smoking status: Former SResearch scientist (life sciences) . Smokeless tobacco: Not on file     Comment: has not smoked in about 39yr  . Alcohol Use: 0.0 oz/week    0 Standard drinks or equivalent per week     Comment: occasionally     Current outpatient prescriptions:  .  ABILIFY 5 MG tablet, TAKE 1 AND 1/2 TABLETS BY MOUTH AT BEDTIME, Disp: , Rfl: 5 .  ALPRAZolam (XANAX) 1 MG tablet, TAKE 1 TABLET BY MOUTH 5 TIMES A DAY AS NEEDED FOR ANXIETY, Disp: , Rfl: 2 .  venlafaxine XR (EFFEXOR-XR) 75 MG 24 hr capsule, Take 225 mg by mouth daily., Disp: , Rfl: 5 .  zolpidem (AMBIEN CR) 12.5 MG CR tablet, Take 12.5 mg by mouth at bedtime., Disp: , Rfl: 0  Past Surgical History  Procedure Laterality Date  . Cholecystectomy    . Oophorectomy      Family History  Problem Relation Age of Onset  . Cancer Mother     ovarian  . Other Father     tumor in his ear drum  . Diabetes Father   . Heart disease Father   . Hyperlipidemia Father   . Hypertension Father   . Kidney disease Daughter     hydronephrosis  . Arthritis Paternal Grandmother   . COPD Paternal Grandmother   . Emphysema  Paternal Grandmother   . Heart disease Paternal Grandmother   . Cancer Paternal Grandfather     skin  . COPD Paternal Grandfather   . Emphysema Paternal Grandfather     No Known Allergies   Review of Systems  CONSTITUTIONAL: No significant weight changes, fever, chills, weakness or fatigue.  HEENT:  - Eyes: No visual changes.  - Ears: No auditory changes. No pain.  - Nose: No sneezing, congestion, runny nose. - Throat: No sore throat. No changes in swallowing. SKIN: No rash or itching.  CARDIOVASCULAR: No chest pain, chest pressure or chest discomfort. No palpitations or edema.  RESPIRATORY: No shortness of breath, cough or sputum.  GASTROINTESTINAL: No anorexia, nausea, vomiting. No changes in bowel habits. No abdominal pain or blood.  GENITOURINARY: No dysuria. No frequency. No discharge.  NEUROLOGICAL: No headache,  dizziness, syncope, paralysis, ataxia, numbness or tingling in the extremities. No memory changes. No change in bowel or bladder control.  MUSCULOSKELETAL: No joint pain. No muscle pain. HEMATOLOGIC: No anemia, bleeding or bruising.  LYMPHATICS: No enlarged lymph nodes.  PSYCHIATRIC: No change in mood. No change in sleep pattern.  ENDOCRINOLOGIC: No reports of sweating, cold or heat intolerance. No polyuria or polydipsia.     Objective  BP 122/76 mmHg  Pulse 125  Temp(Src) 98.1 F (36.7 C) (Oral)  Resp 18  Wt 338 lb 6.4 oz (153.497 kg)  SpO2 97%  LMP  Body mass index is 52.99 kg/(m^2).  Physical Exam  Constitutional: Patient is obese and well-nourished. In no distress.  HEENT:  - Head: Normocephalic and atraumatic.  - Ears: Bilateral TMs gray, no erythema or effusion - Nose: Nasal mucosa moist - Mouth/Throat: Oropharynx is clear and moist. No tonsillar hypertrophy or erythema. No post nasal drainage.  - Eyes: Conjunctivae clear, EOM movements normal. PERRLA. No scleral icterus.  Neck: Extensive soft tissue, acanthosis nigrans, skin tags. Normal range of motion. Neck supple. No JVD present. No thyromegaly present.  Cardiovascular: Normal rate, regular rhythm and normal heart sounds.  No murmur heard.  Pulmonary/Chest: Effort normal and breath sounds normal. No respiratory distress. Musculoskeletal: Normal range of motion bilateral UE and LE, no joint effusions. Peripheral vascular: Bilateral LE no edema. Neurological: CN II-XII grossly intact with no focal deficits. Alert and oriented to person, place, and time. Coordination, balance, strength, speech and gait are normal.  Skin: Skin is warm and dry. No rash noted. No erythema.  Psychiatric: Patient has a normal mood and affect. Behavior is normal in office today. Judgment and thought content normal in office today.   Recent Results (from the past 2160 hour(s))  Basic metabolic panel     Status: Abnormal   Collection Time:  07/05/15  4:07 PM  Result Value Ref Range   Sodium 135 135 - 145 mmol/L   Potassium 4.0 3.5 - 5.1 mmol/L   Chloride 104 101 - 111 mmol/L   CO2 26 22 - 32 mmol/L   Glucose, Bld 137 (H) 65 - 99 mg/dL   BUN 13 6 - 20 mg/dL   Creatinine, Ser 0.72 0.44 - 1.00 mg/dL   Calcium 9.3 8.9 - 10.3 mg/dL   GFR calc non Af Amer >60 >60 mL/min   GFR calc Af Amer >60 >60 mL/min    Comment: (NOTE) The eGFR has been calculated using the CKD EPI equation. This calculation has not been validated in all clinical situations. eGFR's persistently <60 mL/min signify possible Chronic Kidney Disease.    Anion gap 5 5 -  15  CBC     Status: Abnormal   Collection Time: 07/05/15  4:07 PM  Result Value Ref Range   WBC 13.5 (H) 3.6 - 11.0 K/uL   RBC 4.73 3.80 - 5.20 MIL/uL   Hemoglobin 12.7 12.0 - 16.0 g/dL   HCT 39.8 35.0 - 47.0 %   MCV 84.2 80.0 - 100.0 fL   MCH 26.9 26.0 - 34.0 pg   MCHC 32.0 32.0 - 36.0 g/dL   RDW 14.6 (H) 11.5 - 14.5 %   Platelets 347 150 - 440 K/uL  Urinalysis complete, with microscopic ([facility] only)     Status: Abnormal   Collection Time: 07/05/15  4:07 PM  Result Value Ref Range   Color, Urine YELLOW (A) YELLOW   APPearance CLOUDY (A) CLEAR   Glucose, UA NEGATIVE NEGATIVE mg/dL   Bilirubin Urine NEGATIVE NEGATIVE   Ketones, ur NEGATIVE NEGATIVE mg/dL   Specific Gravity, Urine 1.023 1.005 - 1.030   Hgb urine dipstick NEGATIVE NEGATIVE   pH 5.0 5.0 - 8.0   Protein, ur NEGATIVE NEGATIVE mg/dL   Nitrite NEGATIVE NEGATIVE   Leukocytes, UA 3+ (A) NEGATIVE   RBC / HPF 6-30 0 - 5 RBC/hpf   WBC, UA TOO NUMEROUS TO COUNT 0 - 5 WBC/hpf   Bacteria, UA FEW (A) NONE SEEN   Squamous Epithelial / LPF 6-30 (A) NONE SEEN   Mucous PRESENT   Urine culture     Status: None   Collection Time: 07/05/15  4:07 PM  Result Value Ref Range   Specimen Description URINE, RANDOM    Special Requests Normal    Culture MULTIPLE SPECIES PRESENT, SUGGEST RECOLLECTION    Report Status 07/07/2015 FINAL    Glucose, capillary     Status: None   Collection Time: 07/05/15  4:52 PM  Result Value Ref Range   Glucose-Capillary 87 65 - 99 mg/dL     Assessment & Plan   1. Major depressive disorder, recurrent, in full remission with anxious distress ([place]) Stable on current medications. Continue follow up with Psychiatrist.   2. Morbid obesity with BMI of 50.0-59.9, adult ([place]) The patient has been counseled on their higher than normal BMI.  They have verbally expressed understanding their increased risk for other diseases.  In efforts to meet a better target BMI goal the patient has been counseled on lifestyle, diet and exercise modification tactics. Start with moderate intensity aerobic exercise (walking, jogging, elliptical, swimming, group or individual sports, hiking) at least 1mns a day at least 4 days a week and increase intensity, duration, frequency as tolerated. Diet should include well balance fresh fruits and vegetables avoiding processed foods, carbohydrates and sugars. Drink at least 8oz 10 glasses a day avoiding sodas, sugary fruit drinks, sweetened tea. Check weight on a reliable scale daily and monitor weight loss progress daily. Consider investing in mobile phone apps that will help keep track of weight loss goals.  - CBC with Differential/Platelet - Comprehensive metabolic panel - TSH - Hemoglobin A1c  3. Insomnia Long standing problem.   4. Snoring Likely has sleep apnea.   - Ambulatory referral to Sleep Studies  5. Encounter for cholesteral screening for cardiovascular disease  - Lipid panel  6. Other fatigue Multifactorial.  - CBC with Differential/Platelet - Comprehensive metabolic panel - TSH - Hemoglobin A1c - B12 and Folate Panel - Vitamin D 1,25 dihydroxy

## 2015-07-30 LAB — CBC WITH DIFFERENTIAL/PLATELET
BASOS ABS: 0 10*3/uL (ref 0.0–0.2)
Basos: 0 %
EOS (ABSOLUTE): 0.3 10*3/uL (ref 0.0–0.4)
EOS: 3 %
HEMATOCRIT: 38 % (ref 34.0–46.6)
Hemoglobin: 12.9 g/dL (ref 11.1–15.9)
IMMATURE GRANULOCYTES: 0 %
Immature Grans (Abs): 0 10*3/uL (ref 0.0–0.1)
LYMPHS ABS: 4.1 10*3/uL — AB (ref 0.7–3.1)
Lymphs: 36 %
MCH: 28.4 pg (ref 26.6–33.0)
MCHC: 33.9 g/dL (ref 31.5–35.7)
MCV: 84 fL (ref 79–97)
MONOCYTES: 3 %
MONOS ABS: 0.4 10*3/uL (ref 0.1–0.9)
NEUTROS PCT: 58 %
Neutrophils Absolute: 6.5 10*3/uL (ref 1.4–7.0)
PLATELETS: 322 10*3/uL (ref 150–379)
RBC: 4.54 x10E6/uL (ref 3.77–5.28)
RDW: 14.8 % (ref 12.3–15.4)
WBC: 11.4 10*3/uL — AB (ref 3.4–10.8)

## 2015-07-30 LAB — HEMOGLOBIN A1C
ESTIMATED AVERAGE GLUCOSE: 146 mg/dL
HEMOGLOBIN A1C: 6.7 % — AB (ref 4.8–5.6)

## 2015-07-31 LAB — COMPREHENSIVE METABOLIC PANEL
A/G RATIO: 1.4 (ref 1.1–2.5)
ALT: 19 IU/L (ref 0–32)
AST: 19 IU/L (ref 0–40)
Albumin: 3.9 g/dL (ref 3.5–5.5)
Alkaline Phosphatase: 78 IU/L (ref 39–117)
BILIRUBIN TOTAL: 0.7 mg/dL (ref 0.0–1.2)
BUN/Creatinine Ratio: 13 (ref 8–20)
BUN: 10 mg/dL (ref 6–20)
CALCIUM: 9.1 mg/dL (ref 8.7–10.2)
CO2: 24 mmol/L (ref 18–29)
Chloride: 101 mmol/L (ref 97–106)
Creatinine, Ser: 0.8 mg/dL (ref 0.57–1.00)
GFR calc Af Amer: 109 mL/min/{1.73_m2} (ref >59)
GFR, EST NON AFRICAN AMERICAN: 94 mL/min/{1.73_m2} (ref >59)
GLOBULIN, TOTAL: 2.8 g/dL (ref 1.5–4.5)
Glucose: 105 mg/dL — ABNORMAL HIGH (ref 65–99)
POTASSIUM: 4.6 mmol/L (ref 3.5–5.2)
SODIUM: 141 mmol/L (ref 136–144)
Total Protein: 6.7 g/dL (ref 6.0–8.5)

## 2015-07-31 LAB — B12 AND FOLATE PANEL
FOLATE: 4.9 ng/mL (ref >3.0)
Vitamin B-12: 588 pg/mL (ref 211–946)

## 2015-07-31 LAB — LIPID PANEL
CHOL/HDL RATIO: 6.5 ratio — AB (ref 0.0–4.4)
Cholesterol, Total: 203 mg/dL — ABNORMAL HIGH (ref 100–199)
HDL: 31 mg/dL — ABNORMAL LOW (ref >39)
LDL Calculated: 131 mg/dL — ABNORMAL HIGH (ref 0–99)
TRIGLYCERIDES: 203 mg/dL — AB (ref 0–149)
VLDL Cholesterol Cal: 41 mg/dL — ABNORMAL HIGH (ref 5–40)

## 2015-07-31 LAB — TSH: TSH: 1.32 u[IU]/mL (ref 0.450–4.500)

## 2015-08-03 LAB — VITAMIN D 1,25 DIHYDROXY
VITAMIN D 1, 25 (OH) TOTAL: 34 pg/mL
VITAMIN D3 1, 25 (OH): 34 pg/mL

## 2015-09-07 ENCOUNTER — Emergency Department: Payer: Medicaid Other

## 2015-09-07 ENCOUNTER — Emergency Department
Admission: EM | Admit: 2015-09-07 | Discharge: 2015-09-07 | Disposition: A | Discharge status: Home / Self Care | Payer: Medicaid Other | Attending: Emergency Medicine | Admitting: Emergency Medicine

## 2015-09-07 ENCOUNTER — Encounter: Payer: Self-pay | Admitting: Emergency Medicine

## 2015-09-07 DIAGNOSIS — Z87891 Personal history of nicotine dependence: Secondary | ICD-10-CM | POA: Insufficient documentation

## 2015-09-07 DIAGNOSIS — J40 Bronchitis, not specified as acute or chronic: Secondary | ICD-10-CM | POA: Diagnosis not present

## 2015-09-07 DIAGNOSIS — Z79899 Other long term (current) drug therapy: Secondary | ICD-10-CM | POA: Insufficient documentation

## 2015-09-07 DIAGNOSIS — R071 Chest pain on breathing: Secondary | ICD-10-CM

## 2015-09-07 DIAGNOSIS — R05 Cough: Secondary | ICD-10-CM | POA: Diagnosis present

## 2015-09-07 MED ORDER — IPRATROPIUM-ALBUTEROL 0.5-2.5 (3) MG/3ML IN SOLN
3.0000 mL | Freq: Once | RESPIRATORY_TRACT | Status: AC
  Administered 2015-09-07: 3 mL via RESPIRATORY_TRACT
  Filled 2015-09-07: qty 3

## 2015-09-07 MED ORDER — ALBUTEROL SULFATE HFA 108 (90 BASE) MCG/ACT IN AERS: 2.0000 | INHALATION_SPRAY | Freq: Four times a day (QID) | RESPIRATORY_TRACT | Status: DC | PRN

## 2015-09-07 MED ORDER — GUAIFENESIN-CODEINE 100-10 MG/5ML PO SOLN: 5.0000 mL | Freq: Three times a day (TID) | ORAL | Status: DC | PRN

## 2015-09-07 MED ORDER — BENZONATATE 100 MG PO CAPS: 100.0000 mg | ORAL_CAPSULE | Freq: Three times a day (TID) | ORAL | Status: DC | PRN

## 2015-09-07 NOTE — ED Notes (Signed)
Reports cough and congestion x 3 days.  No resp distress

## 2015-09-07 NOTE — ED Provider Notes (Signed)
[facility] Emergency Department Provider Note ____________________________________________  Time seen: 2000  I have reviewed the triage vital signs and the nursing notes.  HISTORY  Chief Complaint  Cough  HPI Krista Mcgee is a 40 y.o. female visits to the ED for evaluation andmanagement of symptoms that include fatigue that started 2 days prior to arrival. In the onset yesterday continued sluggishness and being overtly tired. She describes awaking this morning about 5 AM with some right sided sternal chest discomfort, as well as a dry cough. She denies any nausea, vomiting, diarrhea. She also denies any sick contacts or recent travel. She does admit to clear runny nose without significant sinus symptoms. She admits that her appetite has been somewhat decreased as well. She has dosed DayQuil for symptom relief but denies any significant benefit. She did not receive the seasonal flu vaccine. She rates the sharp, stabbing discomfort in her chest wall that is only aggravated by deep coughs, a 7/10 in triage.  Past Medical History  Diagnosis Date  . Anxiety   . Depression     Patient Active Problem List   Diagnosis Date Noted  . Morbid obesity with BMI of 50.0-59.9, adult ([place]) 07/29/2015  . Insomnia 07/29/2015  . Major depressive disorder, recurrent, in full remission with anxious distress ([place]) 07/29/2015  . Snoring 07/29/2015  . Encounter for cholesteral screening for cardiovascular disease 07/29/2015  . Other fatigue 07/29/2015    Past Surgical History  Procedure Laterality Date  . Cholecystectomy    . Oophorectomy      Current Outpatient Rx  Name  Route  Sig  Dispense  Refill  . ABILIFY 5 MG tablet      TAKE 1 AND 1/2 TABLETS BY MOUTH AT BEDTIME      5     Dispense as written.   Marland Kitchen albuterol (PROVENTIL HFA;VENTOLIN HFA) 108 (90 Base) MCG/ACT inhaler   Inhalation   Inhale 2 puffs into the lungs every 6 (six) hours as needed for wheezing or  shortness of breath.   1 Inhaler   0   . ALPRAZolam (XANAX) 1 MG tablet      TAKE 1 TABLET BY MOUTH 5 TIMES A DAY AS NEEDED FOR ANXIETY      2   . benzonatate (TESSALON PERLES) 100 MG capsule   Oral   Take 1 capsule (100 mg total) by mouth 3 (three) times daily as needed for cough (Take 1-2 per dose).   30 capsule   0   . guaiFENesin-codeine 100-10 MG/5ML syrup   Oral   Take 5 mLs by mouth 3 (three) times daily as needed for cough.   120 mL   0   . venlafaxine XR (EFFEXOR-XR) 75 MG 24 hr capsule   Oral   Take 225 mg by mouth daily.      5   . zolpidem (AMBIEN CR) 12.5 MG CR tablet   Oral   Take 12.5 mg by mouth at bedtime.      0    Allergies Review of patient's allergies indicates no known allergies.  Family History  Problem Relation Age of Onset  . Cancer Mother     ovarian  . Other Father     tumor in his ear drum  . Diabetes Father   . Heart disease Father   . Hyperlipidemia Father   . Hypertension Father   . Kidney disease Daughter     hydronephrosis  . Arthritis Paternal Grandmother   .  COPD Paternal Grandmother   . Emphysema Paternal Grandmother   . Heart disease Paternal Grandmother   . Cancer Paternal Grandfather     skin  . COPD Paternal Grandfather   . Emphysema Paternal Grandfather     Social History Social History  Substance Use Topics  . Smoking status: Former Research scientist (life sciences)  . Smokeless tobacco: None     Comment: has not smoked in about 45yrs.  . Alcohol Use: 0.0 oz/week    0 Standard drinks or equivalent per week     Comment: occasionally   Review of Systems  Constitutional: Negative for fever. Eyes: Negative for visual changes. ENT: Negative for sore throat. Cardiovascular: Negative for chest pain. Respiratory: Negative for shortness of breath. Reports chest wall pain and dry cough as above. Gastrointestinal: Negative for abdominal pain, vomiting and diarrhea. Genitourinary: Negative for dysuria. Musculoskeletal: Negative for  back pain.  Skin: Negative for rash. Neurological: Negative for headaches, focal weakness or numbness. ____________________________________________  PHYSICAL EXAM:  VITAL SIGNS: ED Triage Vitals  Enc Vitals Group     BP 09/07/15 1900 140/92 mmHg     Pulse Rate 09/07/15 1900 96     Resp 09/07/15 1900 20     Temp 09/07/15 1900 98.5 F (36.9 C)     Temp Source 09/07/15 1900 Oral     SpO2 09/07/15 1900 97 %     Weight 09/07/15 1900 340 lb (154.223 kg)     Height 09/07/15 1900 5\' 7"  (1.702 m)     Head Cir --      Peak Flow --      Pain Score 09/07/15 1900 7     Pain Loc --      Pain Edu? --      Excl. in [place]? --    Constitutional: Alert and oriented. Well appearing and in no distress. Head: Normocephalic and atraumatic.      Eyes: Conjunctivae are normal. PERRL. Normal extraocular movements      Ears: Canals clear. TMs intact bilaterally.   Nose: No congestion/rhinorrhea.   Mouth/Throat: Mucous membranes are moist.   Neck: Supple. No thyromegaly. Hematological/Lymphatic/Immunological: No cervical lymphadenopathy. Cardiovascular: Normal rate, regular rhythm.  Respiratory: Normal respiratory effort. No wheezes/rales/rhonchi. Gastrointestinal: Soft and nontender. No distention. Musculoskeletal: Nontender with normal range of motion in all extremities.  Neurologic:  Normal gait without ataxia. Normal speech and language. No gross focal neurologic deficits are appreciated. Skin:  Skin is warm, dry and intact. No rash noted. Psychiatric: Mood and affect are normal. Patient exhibits appropriate insight and judgment. ____________________________________________   RADIOLOGY  CXR IMPRESSION: Bronchitic changes with mild LEFT basilar atelectasis.  I, Caydence Koenig, Dannielle Karvonen, personally viewed and evaluated these images (plain radiographs) as part of my medical decision making, as well as reviewing the written report by the  radiologist. ____________________________________________  PROCEDURES  DuoNeb x 1 ____________________________________________  INITIAL IMPRESSION / ASSESSMENT AND PLAN / ED COURSE  Patient with improved symptoms following breathing treatment. She is discharged with a prescription for albuterol inhaler, Tessalon Perles, guaifenesin-codeine syrup. She will follow with the primary care provider for ongoing symptoms. Return precautions are provided. ____________________________________________  FINAL CLINICAL IMPRESSION(S) / ED DIAGNOSES  Final diagnoses:  Bronchitis  Pain of anterior chest wall with respiration      Melvenia Needles, PA-C 09/07/15 2121  Nena Polio, MD 09/08/15 [phone]

## 2015-09-07 NOTE — Discharge Instructions (Signed)
How to Use an Inhaler Using your inhaler correctly is very important. Good technique will make sure that the medicine reaches your lungs.  HOW TO USE AN INHALER:  Take the cap off the inhaler.  If this is the first time using your inhaler, you need to prime it. Shake the inhaler for 5 seconds. Release four puffs into the air, away from your face. Ask your doctor for help if you have questions.  Shake the inhaler for 5 seconds.  Turn the inhaler so the bottle is above the mouthpiece.  Put your pointer finger on top of the bottle. Your thumb holds the bottom of the inhaler.  Open your mouth.  Either hold the inhaler away from your mouth (the width of 2 fingers) or place your lips tightly around the mouthpiece. Ask your doctor which way to use your inhaler.  Breathe out as much air as possible.  Breathe in and push down on the bottle 1 time to release the medicine. You will feel the medicine go in your mouth and throat.  Continue to take a deep breath in very slowly. Try to fill your lungs.  After you have breathed in completely, hold your breath for 10 seconds. This will help the medicine to settle in your lungs. If you cannot hold your breath for 10 seconds, hold it for as long as you can before you breathe out.  Breathe out slowly, through pursed lips. Whistling is an example of pursed lips.  If your doctor has told you to take more than 1 puff, wait at least 15-30 seconds between puffs. This will help you get the best results from your medicine. Do not use the inhaler more than your doctor tells you to.  Put the cap back on the inhaler.  Follow the directions from your doctor or from the inhaler package about cleaning the inhaler. If you use more than one inhaler, ask your doctor which inhalers to use and what order to use them in. Ask your doctor to help you figure out when you will need to refill your inhaler.  If you use a steroid inhaler, always rinse your mouth with water  after your last puff, gargle and spit out the water. Do not swallow the water. GET HELP IF:  The inhaler medicine only partially helps to stop wheezing or shortness of breath.  You are having trouble using your inhaler.  You have some increase in thick spit (phlegm). GET HELP RIGHT AWAY IF:  The inhaler medicine does not help your wheezing or shortness of breath or you have tightness in your chest.  You have dizziness, headaches, or fast heart rate.  You have chills, fever, or night sweats.  You have a large increase of thick spit, or your thick spit is bloody. MAKE SURE YOU:   Understand these instructions.  Will watch your condition.  Will get help right away if you are not doing well or get worse.   This information is not intended to replace advice given to you by your health care provider. Make sure you discuss any questions you have with your health care provider.   Document Released: 05/19/2008 Document Revised: 05/31/2013 Document Reviewed: 03/09/2013 Elsevier Interactive Patient Education 2016 [place] the prescription meds as directed. Monitor symptoms and follow-up with your provider for ongoing or worsening symptoms. Increase fluid intake and consider a room humidifier as discussed.

## 2015-09-11 ENCOUNTER — Encounter: Payer: Self-pay | Admitting: Emergency Medicine

## 2015-09-11 ENCOUNTER — Emergency Department
Admission: EM | Admit: 2015-09-11 | Discharge: 2015-09-11 | Disposition: A | Discharge status: Home / Self Care | Payer: Medicaid Other | Attending: Emergency Medicine | Admitting: Emergency Medicine

## 2015-09-11 ENCOUNTER — Emergency Department: Payer: Medicaid Other

## 2015-09-11 DIAGNOSIS — R0602 Shortness of breath: Secondary | ICD-10-CM | POA: Diagnosis present

## 2015-09-11 DIAGNOSIS — Z87891 Personal history of nicotine dependence: Secondary | ICD-10-CM | POA: Diagnosis not present

## 2015-09-11 DIAGNOSIS — J159 Unspecified bacterial pneumonia: Secondary | ICD-10-CM | POA: Insufficient documentation

## 2015-09-11 DIAGNOSIS — Z79899 Other long term (current) drug therapy: Secondary | ICD-10-CM | POA: Insufficient documentation

## 2015-09-11 DIAGNOSIS — J189 Pneumonia, unspecified organism: Secondary | ICD-10-CM

## 2015-09-11 LAB — COMPREHENSIVE METABOLIC PANEL
ALBUMIN: 3.5 g/dL (ref 3.5–5.0)
ALK PHOS: 69 U/L (ref 38–126)
ALT: 20 U/L (ref 14–54)
ANION GAP: 8 (ref 5–15)
AST: 19 U/L (ref 15–41)
BUN: 10 mg/dL (ref 6–20)
CALCIUM: 8.6 mg/dL — AB (ref 8.9–10.3)
CO2: 26 mmol/L (ref 22–32)
Chloride: 103 mmol/L (ref 101–111)
Creatinine, Ser: 0.75 mg/dL (ref 0.44–1.00)
GFR calc Af Amer: >60 mL/min (ref >60)
GFR calc non Af Amer: >60 mL/min (ref >60)
GLUCOSE: 135 mg/dL — AB (ref 65–99)
Potassium: 3.7 mmol/L (ref 3.5–5.1)
SODIUM: 137 mmol/L (ref 135–145)
Total Bilirubin: 1 mg/dL (ref 0.3–1.2)
Total Protein: 7.2 g/dL (ref 6.5–8.1)

## 2015-09-11 LAB — CBC WITH DIFFERENTIAL/PLATELET
BASOS PCT: 1 %
Basophils Absolute: 0.1 10*3/uL (ref 0–0.1)
Eosinophils Absolute: 0.5 10*3/uL (ref 0–0.7)
Eosinophils Relative: 5 %
HEMATOCRIT: 35.6 % (ref 35.0–47.0)
Hemoglobin: 11.9 g/dL — ABNORMAL LOW (ref 12.0–16.0)
Lymphocytes Relative: 34 %
Lymphs Abs: 3.4 10*3/uL (ref 1.0–3.6)
MCH: 28.9 pg (ref 26.0–34.0)
MCHC: 33.6 g/dL (ref 32.0–36.0)
MCV: 86.1 fL (ref 80.0–100.0)
MONO ABS: 0.4 10*3/uL (ref 0.2–0.9)
MONOS PCT: 4 %
NEUTROS ABS: 5.8 10*3/uL (ref 1.4–6.5)
Neutrophils Relative %: 56 %
Platelets: 338 10*3/uL (ref 150–440)
RBC: 4.13 MIL/uL (ref 3.80–5.20)
RDW: 14.5 % (ref 11.5–14.5)
WBC: 10.2 10*3/uL (ref 3.6–11.0)

## 2015-09-11 LAB — FIBRIN DERIVATIVES D-DIMER (ARMC ONLY): Fibrin derivatives D-dimer (ARMC): 1382 — ABNORMAL HIGH (ref 0–499)

## 2015-09-11 MED ORDER — IOHEXOL 350 MG/ML SOLN
100.0000 mL | Freq: Once | INTRAVENOUS | Status: AC | PRN
  Administered 2015-09-11: 100 mL via INTRAVENOUS

## 2015-09-11 MED ORDER — IPRATROPIUM-ALBUTEROL 0.5-2.5 (3) MG/3ML IN SOLN
3.0000 mL | Freq: Once | RESPIRATORY_TRACT | Status: AC
  Administered 2015-09-11: 3 mL via RESPIRATORY_TRACT
  Filled 2015-09-11: qty 3

## 2015-09-11 MED ORDER — AZITHROMYCIN 250 MG PO TABS
500.0000 mg | ORAL_TABLET | Freq: Once | ORAL | Status: AC
  Administered 2015-09-11: 500 mg via ORAL
  Filled 2015-09-11: qty 2

## 2015-09-11 MED ORDER — METHYLPREDNISOLONE SODIUM SUCC 125 MG IJ SOLR
125.0000 mg | Freq: Once | INTRAMUSCULAR | Status: AC
  Administered 2015-09-11: 125 mg via INTRAVENOUS
  Filled 2015-09-11: qty 2

## 2015-09-11 MED ORDER — AZITHROMYCIN 250 MG PO TABS: ORAL_TABLET | ORAL | Status: AC

## 2015-09-11 MED ORDER — ALBUTEROL SULFATE (2.5 MG/3ML) 0.083% IN NEBU
5.0000 mg | INHALATION_SOLUTION | Freq: Once | RESPIRATORY_TRACT | Status: AC
  Administered 2015-09-11: 5 mg via RESPIRATORY_TRACT
  Filled 2015-09-11: qty 6

## 2015-09-11 NOTE — ED Notes (Signed)
Reports seen here recently and dx with bronchitis.  States the sob and cough are not getting better.  No resp distress.

## 2015-09-11 NOTE — ED Provider Notes (Signed)
[facility] Emergency Department Provider Note  ____________________________________________  Time seen: Approximately 1:56 PM  I have reviewed the triage vital signs and the nursing notes.   HISTORY  Chief Complaint Shortness of Breath    HPI Krista Mcgee is a 40 y.o. female patient reports worsening shortness of breath and dry cough. Patient reports she is coughing to the point of almost passing out. Patient's chest hurts with coughing and deep breathing. Patient says she was diagnosed with bronchitis. The medicines and inhalers she was given are not working. Patient also feels weak. Symptoms have gotten worse in the last 2 days since she was here. Patient has no other medical problems except for anxiety and depression.  Past Medical History  Diagnosis Date  . Anxiety   . Depression     Patient Active Problem List   Diagnosis Date Noted  . Morbid obesity with BMI of 50.0-59.9, adult ([place]) 07/29/2015  . Insomnia 07/29/2015  . Major depressive disorder, recurrent, in full remission with anxious distress ([place]) 07/29/2015  . Snoring 07/29/2015  . Encounter for cholesteral screening for cardiovascular disease 07/29/2015  . Other fatigue 07/29/2015    Past Surgical History  Procedure Laterality Date  . Cholecystectomy    . Oophorectomy      Current Outpatient Rx  Name  Route  Sig  Dispense  Refill  . ABILIFY 5 MG tablet      TAKE 1 AND 1/2 TABLETS BY MOUTH AT BEDTIME      5     Dispense as written.   Marland Kitchen albuterol (PROVENTIL HFA;VENTOLIN HFA) 108 (90 Base) MCG/ACT inhaler   Inhalation   Inhale 2 puffs into the lungs every 6 (six) hours as needed for wheezing or shortness of breath.   1 Inhaler   0   . ALPRAZolam (XANAX) 1 MG tablet      TAKE 1 TABLET BY MOUTH 5 TIMES A DAY AS NEEDED FOR ANXIETY      2   . benzonatate (TESSALON PERLES) 100 MG capsule   Oral   Take 1 capsule (100 mg total) by mouth 3 (three) times daily as needed for  cough (Take 1-2 per dose).   30 capsule   0   . guaiFENesin-codeine 100-10 MG/5ML syrup   Oral   Take 5 mLs by mouth 3 (three) times daily as needed for cough.   120 mL   0   . venlafaxine XR (EFFEXOR-XR) 75 MG 24 hr capsule   Oral   Take 225 mg by mouth daily.      5   . zolpidem (AMBIEN CR) 12.5 MG CR tablet   Oral   Take 12.5 mg by mouth at bedtime.      0   . azithromycin (ZITHROMAX Z-PAK) 250 MG tablet      Take 2 tablets (500 mg) on  Day 1,  followed by 1 tablet (250 mg) once daily on Days 2 through 5.   6 each   0     Allergies Review of patient's allergies indicates no known allergies.  Family History  Problem Relation Age of Onset  . Cancer Mother     ovarian  . Other Father     tumor in his ear drum  . Diabetes Father   . Heart disease Father   . Hyperlipidemia Father   . Hypertension Father   . Kidney disease Daughter     hydronephrosis  . Arthritis Paternal Grandmother   . COPD Paternal  Grandmother   . Emphysema Paternal Grandmother   . Heart disease Paternal Grandmother   . Cancer Paternal Grandfather     skin  . COPD Paternal Grandfather   . Emphysema Paternal Grandfather     Social History Social History  Substance Use Topics  . Smoking status: Former Research scientist (life sciences)  . Smokeless tobacco: None     Comment: has not smoked in about 81yrs.  . Alcohol Use: 0.0 oz/week    0 Standard drinks or equivalent per week     Comment: occasionally    Review of Systems Constitutional: No fever/chills Eyes: No visual changes. ENT: No sore throat. Cardiovascular: See history of present illness. Respiratory: See history of present illness Gastrointestinal: No abdominal pain.  No nausea, no vomiting.  No diarrhea.  No constipation. Genitourinary: Negative for dysuria. Musculoskeletal: Negative for back pain. Skin: Negative for rash. Neurological: Negative for headaches, focal weakness or numbness. *} 10-point ROS otherwise  negative.  ____________________________________________   PHYSICAL EXAM:  VITAL SIGNS: ED Triage Vitals  Enc Vitals Group     BP 09/11/15 1046 95/74 mmHg     Pulse Rate 09/11/15 1046 100     Resp 09/11/15 1046 20     Temp 09/11/15 1046 98.1 F (36.7 C)     Temp Source 09/11/15 1046 Oral     SpO2 09/11/15 1046 96 %     Weight 09/11/15 1046 334 lb (151.501 kg)     Height 09/11/15 1046 5\' 7"  (1.702 m)     Head Cir --      Peak Flow --      Pain Score 09/11/15 1047 6     Pain Loc --      Pain Edu? --      Excl. in [place]? --     Constitutional: Alert and oriented. Well appearing and in no acute distress. Eyes: Conjunctivae are normal. PERRL. EOMI. Head: Atraumatic. Nose: No congestion/rhinnorhea. Mouth/Throat: Mucous membranes are moist.  Oropharynx non-erythematous. Neck: No stridor. Cardiovascular: Normal rate, regular rhythm. Grossly normal heart sounds.  Good peripheral circulation. Respiratory: Normal respiratory effort.  No retractions. Lungs CTAB. Gastrointestinal: Soft and nontender. No distention. No abdominal bruits. No CVA tenderness. Musculoskeletal: No lower extremity tenderness nor edema.  No joint effusions. Neurologic:  Normal speech and language. No gross focal neurologic deficits are appreciated. No gait instability. Skin:  Skin is warm, dry and intact. No rash noted. Psychiatric: Mood and affect are normal. Speech and behavior are normal.  ____________________________________________   LABS (all labs ordered are listed, but only abnormal results are displayed)  Labs Reviewed  COMPREHENSIVE METABOLIC PANEL - Abnormal; Notable for the following:    Glucose, Bld 135 (*)    Calcium 8.6 (*)    All other components within normal limits  CBC WITH DIFFERENTIAL/PLATELET - Abnormal; Notable for the following:    Hemoglobin 11.9 (*)    All other components within normal limits  FIBRIN DERIVATIVES D-DIMER ([facility] ONLY) - Abnormal; Notable for the following:     Fibrin derivatives D-dimer [facility]) 1382 (*)    All other components within normal limits   ____________________________________________  EKG  EKG read and interpreted by me shows sinus tachycardia rate of 101 normal axis very similar to one from 07/05/1999 extending. ____________________________________________  RADIOLOGY  Chest x-ray showed no acute pathology per radiology. CT scan shows pneumonia no PE ____________________________________________   PROCEDURES  Patient walked back and forth in the room. Patient does not desat patient's blood pressure does not fall.  Patient does get somewhat tachycardic but these this resolves quickly when patient sits down.  ____________________________________________   INITIAL IMPRESSION / ASSESSMENT AND PLAN / ED COURSE  Pertinent labs & imaging results that were available during my care of the patient were reviewed by me and considered in my medical decision making (see chart for details).   ____________________________________________   FINAL CLINICAL IMPRESSION(S) / ED DIAGNOSES  Final diagnoses:  Community acquired pneumonia      Nena Polio, MD 09/11/15 1530

## 2015-09-11 NOTE — Discharge Instructions (Signed)
Community-Acquired Pneumonia, Adult Pneumonia is an infection of the lungs. One type of pneumonia can happen while a person is in a hospital. A different type can happen when a person is not in a hospital (community-acquired pneumonia). It is easy for this kind to spread from person to person. It can spread to you if you breathe near an infected person who coughs or sneezes. Some symptoms include:  A dry cough.  A wet (productive) cough.  Fever.  Sweating.  Chest pain. HOME CARE  Take over-the-counter and prescription medicines only as told by your doctor.  Only take cough medicine if you are losing sleep.  If you were prescribed an antibiotic medicine, take it as told by your doctor. Do not stop taking the antibiotic even if you start to feel better.  Sleep with your head and neck raised (elevated). You can do this by putting a few pillows under your head, or you can sleep in a recliner.  Do not use tobacco products. These include cigarettes, chewing tobacco, and e-cigarettes. If you need help quitting, ask your doctor.  Drink enough water to keep your pee (urine) clear or pale yellow. A shot (vaccine) can help prevent pneumonia. Shots are often suggested for:  People older than 38 years of age.  People older than 38 years of age:  Who are having cancer treatment.  Who have long-term (chronic) lung disease.  Who have problems with their body's defense system (immune system). You may also prevent pneumonia if you take these actions:  Get the flu (influenza) shot every year.  Go to the dentist as often as told.  Wash your hands often. If soap and water are not available, use hand sanitizer. GET HELP IF:  You have a fever.  You lose sleep because your cough medicine does not help. GET HELP RIGHT AWAY IF:  You are short of breath and it gets worse.  You have more chest pain.  Your sickness gets worse. This is very serious if:  You are an older adult.  Your  body's defense system is weak.  You cough up blood.   This information is not intended to replace advice given to you by your health care provider. Make sure you discuss any questions you have with your health care provider.   Document Released: 01/27/2008 Document Revised: 05/01/2015 Document Reviewed: 12/05/2014 Elsevier Interactive Patient Education 2016 [place] [employer]. 2 pills tomorrow and then one a day after that. Usually her inhaler 2 puffs up to every 4 hours if needed. Please return if you're short of breath or feels sicker weaker. Follow-up with your doctor in 2 or 3 days to see how you're doing.

## 2015-09-20 ENCOUNTER — Ambulatory Visit
Admission: EM | Admit: 2015-09-20 | Discharge: 2015-09-20 | Disposition: A | Discharge status: Home / Self Care | Payer: Medicaid Other | Attending: Family Medicine | Admitting: Family Medicine

## 2015-09-20 ENCOUNTER — Encounter: Payer: Self-pay | Admitting: *Deleted

## 2015-09-20 ENCOUNTER — Ambulatory Visit: Payer: Medicaid Other

## 2015-09-20 DIAGNOSIS — R05 Cough: Secondary | ICD-10-CM | POA: Diagnosis present

## 2015-09-20 DIAGNOSIS — J189 Pneumonia, unspecified organism: Secondary | ICD-10-CM | POA: Insufficient documentation

## 2015-09-20 MED ORDER — METHYLPREDNISOLONE SODIUM SUCC 125 MG IJ SOLR
125.0000 mg | Freq: Once | INTRAMUSCULAR | Status: AC
  Administered 2015-09-20: 125 mg via INTRAMUSCULAR

## 2015-09-20 MED ORDER — HYDROCOD POLST-CPM POLST ER 10-8 MG/5ML PO SUER: 5.0000 mL | Freq: Two times a day (BID) | ORAL | Status: DC | PRN

## 2015-09-20 MED ORDER — LEVOFLOXACIN 500 MG PO TABS: 500.0000 mg | ORAL_TABLET | Freq: Every day | ORAL | Status: DC

## 2015-09-20 MED ORDER — IPRATROPIUM-ALBUTEROL 0.5-2.5 (3) MG/3ML IN SOLN
3.0000 mL | Freq: Once | RESPIRATORY_TRACT | Status: AC
  Administered 2015-09-20: 3 mL via RESPIRATORY_TRACT

## 2015-09-20 MED ORDER — PREDNISONE 10 MG (21) PO TBPK: ORAL_TABLET | ORAL | Status: DC

## 2015-09-20 NOTE — ED Provider Notes (Signed)
CSN: [id number]     Arrival date & time 09/20/15  [id number] History   First MD Initiated Contact with Patient 09/20/15 1114    Nurses notes were reviewed. Chief Complaint  Patient presents with  . Cough   Patient reports having a cough and congestion that started essentially earlier in January around the 11th. He was seen in the ER on the 14th treated for acute bronchitis not placed on any antibiotics which is appropriate and placed on an inhaler steroids and a cough syrup. She's informant cough syrup was nasty things did not improve him on the 18th she was back in the ED with more difficult to breathing and difficulty with a cough. She had a PE study CT scan done and no PE was shown but she did have a pneumonia. I reviewed the records and she was diagnosed with a pneumonia by CT scan. She states that the coughing congestion has gotten worse or not improved release. She was placed on Zithromax and states that she's finished the Zithromax and she still not any better.  (Consider location/radiation/quality/duration/timing/severity/associated sxs/prior Treatment) HPI  Past Medical History  Diagnosis Date  . Anxiety   . Depression    Past Surgical History  Procedure Laterality Date  . Cholecystectomy    . Oophorectomy     Family History  Problem Relation Age of Onset  . Cancer Mother     ovarian  . Other Father     tumor in his ear drum  . Diabetes Father   . Heart disease Father   . Hyperlipidemia Father   . Hypertension Father   . Kidney disease Daughter     hydronephrosis  . Arthritis Paternal Grandmother   . COPD Paternal Grandmother   . Emphysema Paternal Grandmother   . Heart disease Paternal Grandmother   . Cancer Paternal Grandfather     skin  . COPD Paternal Grandfather   . Emphysema Paternal Grandfather    Social History  Substance Use Topics  . Smoking status: Former Research scientist (life sciences)  . Smokeless tobacco: None     Comment: has not smoked in about 45yrs.  . Alcohol Use: 0.0  oz/week    0 Standard drinks or equivalent per week     Comment: occasionally   OB History    No data available     Review of Systems  Constitutional: Positive for activity change.  Respiratory: Positive for cough, shortness of breath and wheezing.   Gastrointestinal: Positive for nausea and abdominal pain.  All other systems reviewed and are negative.   Allergies  Review of patient's allergies indicates no known allergies.  Home Medications   Prior to Admission medications   Medication Sig Start Date End Date Taking? Authorizing Provider  ABILIFY 5 MG tablet TAKE 1 AND 1/2 TABLETS BY MOUTH AT BEDTIME 05/26/15  Yes Historical Provider, MD  albuterol (PROVENTIL HFA;VENTOLIN HFA) 108 (90 Base) MCG/ACT inhaler Inhale 2 puffs into the lungs every 6 (six) hours as needed for wheezing or shortness of breath. 09/07/15  Yes Jenise V Bacon Menshew, PA-C  ALPRAZolam (XANAX) 1 MG tablet TAKE 1 TABLET BY MOUTH 5 TIMES A DAY AS NEEDED FOR ANXIETY 06/26/15  Yes Historical Provider, MD  venlafaxine XR (EFFEXOR-XR) 75 MG 24 hr capsule Take 225 mg by mouth daily. 06/18/15  Yes Historical Provider, MD  zolpidem (AMBIEN CR) 12.5 MG CR tablet Take 12.5 mg by mouth at bedtime. 06/21/15  Yes Historical Provider, MD  benzonatate (TESSALON PERLES) 100 MG capsule Take 1  capsule (100 mg total) by mouth 3 (three) times daily as needed for cough (Take 1-2 per dose). 09/07/15   Jenise V Bacon Menshew, PA-C  chlorpheniramine-HYDROcodone (TUSSIONEX PENNKINETIC ER) 10-8 MG/5ML SUER Take 5 mLs by mouth every 12 (twelve) hours as needed for cough. 09/20/15   Frederich Cha, MD  guaiFENesin-codeine 100-10 MG/5ML syrup Take 5 mLs by mouth 3 (three) times daily as needed for cough. 09/07/15   Jenise V Bacon Menshew, PA-C  levofloxacin (LEVAQUIN) 500 MG tablet Take 1 tablet (500 mg total) by mouth daily. 09/20/15   Frederich Cha, MD  predniSONE (STERAPRED UNI-PAK 21 TAB) 10 MG (21) TBPK tablet Sig 6 tablet day 1, 5 tablets day 2, 4  tablets day 3,,3tablets day 4, 2 tablets day 5, 1 tablet day 6 take all tablets orally 09/20/15   Frederich Cha, MD   Meds Ordered and Administered this Visit   Medications  methylPREDNISolone sodium succinate (SOLU-MEDROL) 125 mg/2 mL injection 125 mg (125 mg Intramuscular Given 09/20/15 1143)  ipratropium-albuterol (DUONEB) 0.5-2.5 (3) MG/3ML nebulizer solution 3 mL (3 mLs Nebulization Given 09/20/15 1143)    BP 140/89 mmHg  Pulse 97  Temp(Src) 98.2 F (36.8 C) (Oral)  Resp 20  Ht 5\' 6"  (1.676 m)  Wt 330 lb (149.687 kg)  BMI 53.29 kg/m2  SpO2 95%  LMP 09/07/2015 (LMP Unknown) No data found.   Physical Exam  Constitutional: She is oriented to person, place, and time. She appears well-developed and well-nourished.  Patient's obese white female  HENT:  Head: Normocephalic and atraumatic.  Eyes: Conjunctivae are normal. Pupils are equal, round, and reactive to light.  Neck: Neck supple.  Cardiovascular: Normal rate, regular rhythm and normal heart sounds.   Pulmonary/Chest: Effort normal. No respiratory distress. She has wheezes in the right upper field, the right middle field and the right lower field.  Abdominal: Normal appearance. There is no hepatosplenomegaly. There is no tenderness. There is no CVA tenderness.  Musculoskeletal: Normal range of motion. She exhibits no edema.  Neurological: She is alert and oriented to person, place, and time.  Skin: Skin is warm and dry.  Psychiatric: She has a normal mood and affect. Her behavior is normal.  Vitals reviewed.   ED Course  Procedures (including critical care time)  Labs Review Labs Reviewed - No data to display  Imaging Review Dg Chest 2 View  09/20/2015  CLINICAL DATA:  Shortness of breath and cough for 2 weeks EXAM: CHEST  2 VIEW COMPARISON:  09/11/2015 FINDINGS: Cardiac shadow is stable. The previously seen infiltrative changes in the left lung have resolved in the interval. No sizable infiltrate or effusion is noted.  The bony structures are within normal limits with stable scoliosis. Pleural thickening secondary to subpleural fat bilaterally is noted. IMPRESSION: No active cardiopulmonary disease. Electronically Signed   By: Inez Catalina M.D.   On: 09/20/2015 12:20     Visual Acuity Review  Right Eye Distance:   Left Eye Distance:   Bilateral Distance:    Right Eye Near:   Left Eye Near:    Bilateral Near:         MDM   1. Community acquired pneumonia     Due to the recurrent and persistent cough will place patient on DuoNeb treatment and Solu-Medrol here and will plan to probably place on Levaquin by mouth to go home Tussionex for the cough and another round of prednisone.  I've explained in length that the radiologist would recommend a repeat CT  scan in 3 months. She became upset that I explained to her that the Micro-K regular chest x-ray in 3 months and that shows complete clearing in the CT probably is not going be needed. But the reason that the radiologist wants to wait 2-3 months is he wants to make sure that is going to be cleared and not be ON a partially resolved after repeat the CT scan a month later  Note: This dictation was prepared with Dragon dictation along with smaller phrase technology. Any transcriptional errors that result from this process are unintentional.  Frederich Cha, MD 09/20/15 1312

## 2015-09-20 NOTE — Discharge Instructions (Signed)

## 2015-09-20 NOTE — ED Notes (Signed)
Patient has been to the emergency room twice for cough and chest congestion; the last visit on 09/11/15. Patient was diagnosed with pneumonia and treated. Symptoms have not resolved.

## 2015-10-01 ENCOUNTER — Ambulatory Visit: Payer: Medicaid Other | Admitting: Family Medicine

## 2016-12-22 ENCOUNTER — Encounter: Payer: Self-pay | Admitting: Emergency Medicine

## 2016-12-22 ENCOUNTER — Emergency Department: Payer: Medicaid Other

## 2016-12-22 ENCOUNTER — Emergency Department
Admission: EM | Admit: 2016-12-22 | Discharge: 2016-12-22 | Discharge status: Against Medical Advice | Payer: Medicaid Other | Attending: Emergency Medicine | Admitting: Emergency Medicine

## 2016-12-22 DIAGNOSIS — Z87891 Personal history of nicotine dependence: Secondary | ICD-10-CM | POA: Insufficient documentation

## 2016-12-22 DIAGNOSIS — R0789 Other chest pain: Secondary | ICD-10-CM | POA: Insufficient documentation

## 2016-12-22 DIAGNOSIS — R079 Chest pain, unspecified: Secondary | ICD-10-CM

## 2016-12-22 LAB — CBC
HCT: 37.2 % (ref 35.0–47.0)
HEMOGLOBIN: 12 g/dL (ref 12.0–16.0)
MCH: 26.3 pg (ref 26.0–34.0)
MCHC: 32.4 g/dL (ref 32.0–36.0)
MCV: 81.2 fL (ref 80.0–100.0)
Platelets: 297 10*3/uL (ref 150–440)
RBC: 4.57 MIL/uL (ref 3.80–5.20)
RDW: 16 % — AB (ref 11.5–14.5)
WBC: 13.5 10*3/uL — ABNORMAL HIGH (ref 3.6–11.0)

## 2016-12-22 LAB — BASIC METABOLIC PANEL
ANION GAP: 7 (ref 5–15)
BUN: 12 mg/dL (ref 6–20)
CALCIUM: 8.9 mg/dL (ref 8.9–10.3)
CO2: 25 mmol/L (ref 22–32)
CREATININE: 0.82 mg/dL (ref 0.44–1.00)
Chloride: 104 mmol/L (ref 101–111)
GFR calc Af Amer: >60 mL/min (ref >60)
GLUCOSE: 115 mg/dL — AB (ref 65–99)
Potassium: 4.1 mmol/L (ref 3.5–5.1)
Sodium: 136 mmol/L (ref 135–145)

## 2016-12-22 LAB — TROPONIN I

## 2016-12-22 NOTE — ED Provider Notes (Signed)
[facility] Emergency Department Provider Note  ____________________________________________  Time seen: Approximately 4:15 AM  I have reviewed the triage vital signs and the nursing notes.   HISTORY  Chief Complaint Chest Pain    HPI TAWANIA DAPONTE is a 41 y.o. female who complains of central chest tightness that started while she was arguing with her child tonight at about midnight. She was emotionally upset at the time. Not exertional, not pleuritic. Lasted for 1 or 2 minutes and then resolved and has not reoccurred. Nonradiating, she felt short of breath but did not have any diaphoresis or vomiting. Never had anything like this before. Pain was severe in intensity, no aggravating or alleviating factors.   Past Medical History:  Diagnosis Date  . Anxiety   . Depression      Patient Active Problem List   Diagnosis Date Noted  . Morbid obesity with BMI of 50.0-59.9, adult ([place]) 07/29/2015  . Insomnia 07/29/2015  . Major depressive disorder, recurrent, in full remission with anxious distress ([place]) 07/29/2015  . Snoring 07/29/2015  . Encounter for cholesteral screening for cardiovascular disease 07/29/2015  . Other fatigue 07/29/2015     Past Surgical History:  Procedure Laterality Date  . CHOLECYSTECTOMY    . OOPHORECTOMY       Prior to Admission medications   Medication Sig Start Date End Date Taking? Authorizing Provider  ABILIFY 5 MG tablet TAKE 1 AND 1/2 TABLETS BY MOUTH AT BEDTIME 05/26/15   Historical Provider, MD  albuterol (PROVENTIL HFA;VENTOLIN HFA) 108 (90 Base) MCG/ACT inhaler Inhale 2 puffs into the lungs every 6 (six) hours as needed for wheezing or shortness of breath. 09/07/15   Jenise V Bacon Menshew, PA-C  ALPRAZolam (XANAX) 1 MG tablet TAKE 1 TABLET BY MOUTH 5 TIMES A DAY AS NEEDED FOR ANXIETY 06/26/15   Historical Provider, MD  benzonatate (TESSALON PERLES) 100 MG capsule Take 1 capsule (100 mg total) by mouth 3 (three) times  daily as needed for cough (Take 1-2 per dose). 09/07/15   Jenise V Bacon Menshew, PA-C  chlorpheniramine-HYDROcodone (TUSSIONEX PENNKINETIC ER) 10-8 MG/5ML SUER Take 5 mLs by mouth every 12 (twelve) hours as needed for cough. 09/20/15   Frederich Cha, MD  guaiFENesin-codeine 100-10 MG/5ML syrup Take 5 mLs by mouth 3 (three) times daily as needed for cough. 09/07/15   Jenise V Bacon Menshew, PA-C  levofloxacin (LEVAQUIN) 500 MG tablet Take 1 tablet (500 mg total) by mouth daily. 09/20/15   Frederich Cha, MD  predniSONE (STERAPRED UNI-PAK 21 TAB) 10 MG (21) TBPK tablet Sig 6 tablet day 1, 5 tablets day 2, 4 tablets day 3,,3tablets day 4, 2 tablets day 5, 1 tablet day 6 take all tablets orally 09/20/15   Frederich Cha, MD  venlafaxine XR (EFFEXOR-XR) 75 MG 24 hr capsule Take 225 mg by mouth daily. 06/18/15   Historical Provider, MD  zolpidem (AMBIEN CR) 12.5 MG CR tablet Take 12.5 mg by mouth at bedtime. 06/21/15   Historical Provider, MD     Allergies Patient has no known allergies.   Family History  Problem Relation Age of Onset  . Cancer Mother     ovarian  . Other Father     tumor in his ear drum  . Diabetes Father   . Heart disease Father   . Hyperlipidemia Father   . Hypertension Father   . Kidney disease Daughter     hydronephrosis  . Arthritis Paternal Grandmother   . COPD Paternal Grandmother   .  Emphysema Paternal Grandmother   . Heart disease Paternal Grandmother   . Cancer Paternal Grandfather     skin  . COPD Paternal Grandfather   . Emphysema Paternal Grandfather     Social History Social History  Substance Use Topics  . Smoking status: Former Research scientist (life sciences)  . Smokeless tobacco: Never Used     Comment: has not smoked in about 28yrs.  . Alcohol use 0.0 oz/week     Comment: occasionally    Review of Systems  Constitutional:   No fever or chills.  ENT:   No sore throat. No rhinorrhea. Lymphatic: No swollen glands, No extremity swelling Endocrine: No hot/cold flashes. No  significant weight change. No neck swelling. Cardiovascular:   Positive chest pain as above. Respiratory:   No dyspnea or cough. Gastrointestinal:   Negative for abdominal pain, vomiting and diarrhea.  Genitourinary:   Negative for dysuria or difficulty urinating. Musculoskeletal:   Negative for focal pain or swelling Neurological:   Negative for headaches or weakness. All other systems reviewed and are negative except as documented above in ROS and HPI.  ____________________________________________   PHYSICAL EXAM:  VITAL SIGNS: ED Triage Vitals  Enc Vitals Group     BP 12/22/16 0057 (!) 166/91     Pulse Rate 12/22/16 0057 92     Resp 12/22/16 0057 18     Temp 12/22/16 0057 98.7 F (37.1 C)     Temp Source 12/22/16 0057 Oral     SpO2 12/22/16 0057 98 %     Weight 12/22/16 0052 (!) 360 lb (163.3 kg)     Height 12/22/16 0052 5\' 7"  (1.702 m)     Head Circumference --      Peak Flow --      Pain Score 12/22/16 0051 6     Pain Loc --      Pain Edu? --      Excl. in [place]? --     Vital signs reviewed, nursing assessments reviewed.   Constitutional:   Alert and oriented. Well appearing and in no distress.Morbidly obese Eyes:   No scleral icterus. No conjunctival pallor. PERRL. EOMI.  No nystagmus. ENT   Head:   Normocephalic and atraumatic.   Nose:   No congestion/rhinnorhea. No septal hematoma   Mouth/Throat:   MMM, no pharyngeal erythema. No peritonsillar mass.    Neck:   No stridor. No SubQ emphysema. No meningismus. Hematological/Lymphatic/Immunilogical:   No cervical lymphadenopathy. Cardiovascular:   RRR. Symmetric bilateral radial and DP pulses.  No murmurs.  Respiratory:   Normal respiratory effort without tachypnea nor retractions. Breath sounds are clear and equal bilaterally. No wheezes/rales/rhonchi. Chest wall nontender Gastrointestinal:   Soft and nontender. Non distended. There is no CVA tenderness.  No rebound, rigidity, or guarding. Genitourinary:    deferred Musculoskeletal:   Normal range of motion in all extremities. No joint effusions.  No lower extremity tenderness.  No edema. Neurologic:   Normal speech and language.  CN 2-10 normal. Motor grossly intact. No gross focal neurologic deficits are appreciated.  Skin:    Skin is warm, dry and intact. No rash noted.  No petechiae, purpura, or bullae.  ____________________________________________    LABS (pertinent positives/negatives) (all labs ordered are listed, but only abnormal results are displayed) Labs Reviewed  BASIC METABOLIC PANEL - Abnormal; Notable for the following:       Result Value   Glucose, Bld 115 (*)    All other components within normal limits  CBC - Abnormal;  Notable for the following:    WBC 13.5 (*)    RDW 16.0 (*)    All other components within normal limits  TROPONIN I  TROPONIN I   ____________________________________________   EKG  Interpreted by me  Date: 12/22/2016  Rate: 94  Rhythm: normal sinus rhythm  QRS Axis: normal  Intervals: normal  ST/T Wave abnormalities: normal  Conduction Disutrbances: none  Narrative Interpretation: unremarkable      ____________________________________________    RADIOLOGY  Dg Chest 2 View  Result Date: 12/22/2016 CLINICAL DATA:  Midchest pain and mild dyspnea tonight. EXAM: CHEST  2 VIEW COMPARISON:  09/20/2015 FINDINGS: Stable mild right hemidiaphragm elevation. The lungs are clear. Stable benign pleural thickening in the lateral and apical regions. Pulmonary vasculature is normal. Hilar and mediastinal contours are unremarkable and unchanged. No pleural effusions. IMPRESSION: No active cardiopulmonary disease. Electronically Signed   By: Andreas Newport M.D.   On: 12/22/2016 01:51    ____________________________________________   PROCEDURES Procedures  ____________________________________________   INITIAL IMPRESSION / ASSESSMENT AND PLAN / ED COURSE  Pertinent labs & imaging results  that were available during my care of the patient were reviewed by me and considered in my medical decision making (see chart for details).  Patient presents with nonspecific chest pain in the setting of getting emotionally upset and aggravated with her child. Atypical in nature, but due to her super morbid obesity and rapid presentation to the emergency room after symptoms, I plan to do delta troponin. Initial workup was negative including EKG chest x-ray and labs. However, around the time that that second troponin draw is due, patient indicated that she wants to be discharged immediately and is just too tired to stay in the ER anymore. I did advise her that it's time to draw the labs and would only be a half hour or more probably before we had the rest of the results. She does not want to wait wants to go home. She'll follow-up with her doctor tomorrow. Advised her she is always welcome to return and encouraged her to return immediately if her symptoms recur. She's been chest pain-free throughout her time in the emergency department. Discharge AGAINST MEDICAL ADVICE.         ____________________________________________   FINAL CLINICAL IMPRESSION(S) / ED DIAGNOSES  Final diagnoses:  Nonspecific chest pain      New Prescriptions   No medications on file     Portions of this note were generated with dragon dictation software. Dictation errors may occur despite best attempts at proofreading.    Carrie Mew, MD 12/22/16 [phone]

## 2016-12-22 NOTE — ED Triage Notes (Signed)
Pt presents to ED with c/o mid chest pain. Started while pt was "arguing" with her teenager. Pt states she felt like it was difficult to catch her breath and felt a little lightheaded/weak. Denies similar symptoms previously.

## 2017-03-09 ENCOUNTER — Encounter: Payer: Self-pay | Admitting: Family Medicine

## 2017-03-09 ENCOUNTER — Telehealth: Payer: Self-pay

## 2017-03-09 ENCOUNTER — Ambulatory Visit (INDEPENDENT_AMBULATORY_CARE_PROVIDER_SITE_OTHER): Payer: Medicaid Other | Admitting: Family Medicine

## 2017-03-09 VITALS — BP 138/74 | HR 87 | Temp 98.1°F | Resp 16 | Ht 66.2 in | Wt 357.2 lb

## 2017-03-09 DIAGNOSIS — Z6841 Body Mass Index (BMI) 40.0 and over, adult: Secondary | ICD-10-CM

## 2017-03-09 DIAGNOSIS — Z5181 Encounter for therapeutic drug level monitoring: Secondary | ICD-10-CM

## 2017-03-09 DIAGNOSIS — R0602 Shortness of breath: Secondary | ICD-10-CM | POA: Diagnosis not present

## 2017-03-09 DIAGNOSIS — F3342 Major depressive disorder, recurrent, in full remission: Secondary | ICD-10-CM

## 2017-03-09 DIAGNOSIS — R938 Abnormal findings on diagnostic imaging of other specified body structures: Secondary | ICD-10-CM | POA: Diagnosis not present

## 2017-03-09 DIAGNOSIS — R9389 Abnormal findings on diagnostic imaging of other specified body structures: Secondary | ICD-10-CM

## 2017-03-09 DIAGNOSIS — R0683 Snoring: Secondary | ICD-10-CM

## 2017-03-09 DIAGNOSIS — R0609 Other forms of dyspnea: Secondary | ICD-10-CM | POA: Insufficient documentation

## 2017-03-09 LAB — LIPID PANEL
CHOL/HDL RATIO: 7.3 ratio — AB (ref <5.0)
CHOLESTEROL: 189 mg/dL (ref <200)
HDL: 26 mg/dL — AB (ref >50)
LDL Cholesterol: 116 mg/dL — ABNORMAL HIGH (ref <100)
TRIGLYCERIDES: 236 mg/dL — AB (ref <150)
VLDL: 47 mg/dL — AB (ref <30)

## 2017-03-09 LAB — CBC WITH DIFFERENTIAL/PLATELET
BASOS ABS: 0 {cells}/uL (ref 0–200)
Basophils Relative: 0 %
EOS PCT: 2 %
Eosinophils Absolute: 230 cells/uL (ref 15–500)
HEMATOCRIT: 40.2 % (ref 35.0–45.0)
HEMOGLOBIN: 13 g/dL (ref 11.7–15.5)
LYMPHS ABS: 3795 {cells}/uL (ref 850–3900)
Lymphocytes Relative: 33 %
MCH: 27.1 pg (ref 27.0–33.0)
MCHC: 32.3 g/dL (ref 32.0–36.0)
MCV: 83.9 fL (ref 80.0–100.0)
MONO ABS: 345 {cells}/uL (ref 200–950)
MPV: 8.9 fL (ref 7.5–12.5)
Monocytes Relative: 3 %
NEUTROS ABS: 7130 {cells}/uL (ref 1500–7800)
Neutrophils Relative %: 62 %
Platelets: 279 10*3/uL (ref 140–400)
RBC: 4.79 MIL/uL (ref 3.80–5.10)
RDW: 16 % — ABNORMAL HIGH (ref 11.0–15.0)
WBC: 11.5 10*3/uL — ABNORMAL HIGH (ref 3.8–10.8)

## 2017-03-09 LAB — COMPLETE METABOLIC PANEL WITH GFR
ALBUMIN: 3.9 g/dL (ref 3.6–5.1)
ALT: 25 U/L (ref 6–29)
AST: 28 U/L (ref 10–30)
Alkaline Phosphatase: 75 U/L (ref 33–115)
BILIRUBIN TOTAL: 0.9 mg/dL (ref 0.2–1.2)
BUN: 10 mg/dL (ref 7–25)
CO2: 22 mmol/L (ref 20–31)
Calcium: 9 mg/dL (ref 8.6–10.2)
Chloride: 102 mmol/L (ref 98–110)
Creat: 0.88 mg/dL (ref 0.50–1.10)
GFR, Est African American: >89 mL/min (ref >=60)
GFR, Est Non African American: 83 mL/min (ref >=60)
GLUCOSE: 142 mg/dL — AB (ref 65–99)
Potassium: 4.1 mmol/L (ref 3.5–5.3)
SODIUM: 137 mmol/L (ref 135–146)
TOTAL PROTEIN: 6.9 g/dL (ref 6.1–8.1)

## 2017-03-09 NOTE — Patient Instructions (Signed)
Let's get labs and the scan We'll get you back in 7-10 days to go over the results and work on other things

## 2017-03-09 NOTE — Assessment & Plan Note (Signed)
Check TSH, vit D, vit B12 to see if that contributes

## 2017-03-09 NOTE — Assessment & Plan Note (Signed)
Will work on helping her lose weight, consider Saxenda or Contrave once we have thyroid results back, etc.

## 2017-03-09 NOTE — Assessment & Plan Note (Signed)
Check glucose, lipids

## 2017-03-09 NOTE — Assessment & Plan Note (Signed)
Refer to pulm for sleep study; cautioned about pressures, may be too high, may be too low, just don't know without data

## 2017-03-09 NOTE — Telephone Encounter (Signed)
I called this patient to inform her that she has been scheduled to have her CT scan on 03/18/17 @ 2:30pm at the [facility] but there was no answer.   A MyChart message was sent instead.

## 2017-03-09 NOTE — Progress Notes (Signed)
BP 138/74   Pulse 87   Temp 98.1 F (36.7 C) (Oral)   Resp 16   Ht 5' 6.2" (1.681 m)   Wt (!) 357 lb 3.2 oz (162 kg)   LMP 09/07/2015 (LMP Unknown) Comment: oophorectomy, no period since  SpO2 96%   BMI 57.31 kg/m    Subjective:    Patient ID: Krista Mcgee, female    DOB: 05-May-1976, 41 y.o.   MRN: [id number]  HPI: Krista Mcgee is a 41 y.o. female  Chief Complaint  Patient presents with  . Shortness of Breath    with walking    HPI Patient is new to me today; last seen by Dr. Nadine Counts here in December 2016 [facility] after she had pneumonia, was seen on Sep 07, 2015 and then again Sep 11, 2015 Had chest CT, then f/u CXR Sep 20, 2015 and another CXR Dec 22, 2016 We reviewed these together in detail today  Chest just gets really tight Cannot lay flat at all; feels like she is smothering Sleep with 5 pillows Has CPAP machine, using 13+ cm H2O; never actually had the formal sleep study, just using those pressures Sometimes gets swelling in her legs, can leave a dent in the leg if pressing against something Not much salt Cannot go up more than about 10 steps  Not coughing up blood; no night sweats; no cough  She believes some of her [facility] may be due to her weight;  Constantly in pain with back and hip and can't go up and down stairs b/c of right knee; easier to go upstairs with her knee; has fallen a lot on that right knee This is the most she has ever weighed Really can't lose weight, cut out a lot of salt; does to one soda a day, used to drink 3-4 a day Postmenopausal, no period; hx of anemia before; no blood transfusion  Depression screen [facility] 2/9 03/19/2017 03/09/2017 07/29/2015  Decreased Interest 0 0 0  Down, Depressed, Hopeless 1 1 3   PHQ - 2 Score 1 1 3   Altered sleeping - - 3  Tired, decreased energy - - 3  Change in appetite - - 0  Feeling bad or failure about yourself  - - 1  Trouble concentrating - - 2  Moving slowly or fidgety/restless - - 0  Suicidal  thoughts - - 0  PHQ-9 Score - - 12    Relevant past medical, surgical, family and social history reviewed Past Medical History:  Diagnosis Date  . Anxiety   . Depression   . Diabetes mellitus without complication ([place])   . Hypertension    Past Surgical History:  Procedure Laterality Date  . CHOLECYSTECTOMY    . OOPHORECTOMY     Family History  Problem Relation Age of Onset  . Cancer Mother        ovarian  . Other Father        tumor in his ear drum  . Diabetes Father   . Heart disease Father   . Hyperlipidemia Father   . Hypertension Father   . Kidney disease Daughter        hydronephrosis  . Arthritis Paternal Grandmother   . COPD Paternal Grandmother   . Emphysema Paternal Grandmother   . Heart disease Paternal Grandmother   . Cancer Paternal Grandfather        skin  . COPD Paternal Grandfather   . Emphysema Paternal Grandfather    Social History   Social  History  . Marital status: Married    Spouse name: N/A  . Number of children: N/A  . Years of education: N/A   Occupational History  . Not on file.   Social History Main Topics  . Smoking status: Former Research scientist (life sciences)  . Smokeless tobacco: Never Used     Comment: has not smoked in about 60yrs.  . Alcohol use 0.0 oz/week     Comment: occasionally  . Drug use: No  . Sexual activity: Yes    Partners: Male    Birth control/ protection: None   Other Topics Concern  . Not on file   Social History Narrative  . No narrative on file  MD note: I'm not sure about the note that says patient has not smoked in 30 years (that means she would have been 11 when she smoked last)  Interim medical history since last visit reviewed. Allergies and medications reviewed  Review of Systems Per HPI unless specifically indicated above     Objective:    BP 138/74   Pulse 87   Temp 98.1 F (36.7 C) (Oral)   Resp 16   Ht 5' 6.2" (1.681 m)   Wt (!) 357 lb 3.2 oz (162 kg)   LMP 09/07/2015 (LMP Unknown) Comment:  oophorectomy, no period since  SpO2 96%   BMI 57.31 kg/m   Wt Readings from Last 3 Encounters:  03/19/17 (!) 351 lb 4.8 oz (159.3 kg)  03/15/17 (!) 352 lb (159.7 kg)  03/09/17 (!) 357 lb 3.2 oz (162 kg)    Physical Exam  Constitutional: She appears well-developed and well-nourished. No distress.  Morbidly obese  HENT:  Head: Normocephalic and atraumatic.  Eyes: EOM are normal. No scleral icterus.  Neck: No thyromegaly present.  Cardiovascular: Normal rate, regular rhythm and normal heart sounds.   No murmur heard. Pulmonary/Chest: Effort normal and breath sounds normal. No respiratory distress. She has no wheezes.  Abdominal: Soft. Bowel sounds are normal. She exhibits no distension.  Musculoskeletal: She exhibits no edema.  Neurological: She is alert. She exhibits normal muscle tone.  Skin: Skin is warm and dry. She is not diaphoretic. No pallor.  Psychiatric: She has a normal mood and affect. Her behavior is normal. Judgment and thought content normal.      Assessment & Plan:   Problem List Items Addressed This Visit      Other   Snoring    Refer to pulm for sleep study; cautioned about pressures, may be too high, may be too low, just don't know without data      Relevant Orders   Ambulatory referral to Pulmonology   Shortness of breath    Check CBC to r/o anemia, check CT scan of chest for f/u of previous abnormal one; full PFTs at the hospital and refer to pulmonologist if not anemia or other obvious finding      Relevant Orders   Ambulatory referral to Pulmonology   CBC with Differential/Platelet (Completed)   D-Dimer, Quantitative (Completed)   B Nat Peptide (Completed)   ECHOCARDIOGRAM COMPLETE   Morbid obesity with BMI of 50.0-59.9, adult ([place])    Will work on helping her lose weight, consider Saxenda or Contrave once we have thyroid results back, etc.      Medication monitoring encounter    Check glucose, lipids      Relevant Orders   COMPLETE  METABOLIC PANEL WITH GFR (Completed)   Lipid panel (Completed)   Major depressive disorder, recurrent, in full remission  with anxious distress ([facility])    Check TSH, vit D, vit B12 to see if that contributes      Relevant Medications   buPROPion (WELLBUTRIN XL) 150 MG 24 hr tablet   Other Relevant Orders   TSH (Completed)   T4, free (Completed)   VITAMIN D 25 Hydroxy (Vit-D Deficiency, Fractures) (Completed)   B12 (Completed)    Other Visit Diagnoses    Abnormal CT of the chest    -  Primary   2017; needs repeat for f/u, likely lymph nodes from previous pneumonia   Relevant Orders   Ambulatory referral to Pulmonology   ECHOCARDIOGRAM COMPLETE   CT Chest W Contrast       Follow up plan: Return in about 10 days (around 03/19/2017) for follow-up visit with Dr. Sanda Klein.  An after-visit summary was printed and given to the patient at [place].  Please see the patient instructions which may contain other information and recommendations beyond what is mentioned above in the assessment and plan.  Meds ordered this encounter  Medications  . SAPHRIS 10 MG SUBL    Sig: Take 10 mg by mouth at bedtime.    Refill:  1  . buPROPion (WELLBUTRIN XL) 150 MG 24 hr tablet    Sig: Take 150 mg by mouth daily.    Orders Placed This Encounter  Procedures  . CT Chest W Contrast  . CBC with Differential/Platelet  . COMPLETE METABOLIC PANEL WITH GFR  . D-Dimer, Quantitative  . B Nat Peptide  . TSH  . T4, free  . VITAMIN D 25 Hydroxy (Vit-D Deficiency, Fractures)  . B12  . Lipid panel  . Ambulatory referral to Pulmonology  . ECHOCARDIOGRAM COMPLETE

## 2017-03-09 NOTE — Assessment & Plan Note (Signed)
Check CBC to r/o anemia, check CT scan of chest for f/u of previous abnormal one; full PFTs at the hospital and refer to pulmonologist if not anemia or other obvious finding

## 2017-03-10 ENCOUNTER — Other Ambulatory Visit: Payer: Self-pay | Admitting: Family Medicine

## 2017-03-10 ENCOUNTER — Ambulatory Visit
Admission: RE | Admit: 2017-03-10 | Discharge: 2017-03-10 | Disposition: A | Discharge status: Home / Self Care | Payer: Medicaid Other | Source: Ambulatory Visit | Attending: Family Medicine | Admitting: Family Medicine

## 2017-03-10 ENCOUNTER — Telehealth: Payer: Self-pay

## 2017-03-10 DIAGNOSIS — Z6841 Body Mass Index (BMI) 40.0 and over, adult: Secondary | ICD-10-CM | POA: Diagnosis not present

## 2017-03-10 DIAGNOSIS — R0602 Shortness of breath: Secondary | ICD-10-CM | POA: Insufficient documentation

## 2017-03-10 DIAGNOSIS — R160 Hepatomegaly, not elsewhere classified: Secondary | ICD-10-CM | POA: Diagnosis not present

## 2017-03-10 DIAGNOSIS — R7989 Other specified abnormal findings of blood chemistry: Secondary | ICD-10-CM | POA: Insufficient documentation

## 2017-03-10 DIAGNOSIS — K760 Fatty (change of) liver, not elsewhere classified: Secondary | ICD-10-CM | POA: Diagnosis not present

## 2017-03-10 DIAGNOSIS — E559 Vitamin D deficiency, unspecified: Secondary | ICD-10-CM

## 2017-03-10 HISTORY — DX: Type 2 diabetes mellitus without complications: E11.9

## 2017-03-10 HISTORY — DX: Essential (primary) hypertension: I10

## 2017-03-10 LAB — VITAMIN D 25 HYDROXY (VIT D DEFICIENCY, FRACTURES): Vit D, 25-Hydroxy: 9 ng/mL — ABNORMAL LOW (ref 30–100)

## 2017-03-10 LAB — T4, FREE: Free T4: 1.1 ng/dL (ref 0.8–1.8)

## 2017-03-10 LAB — TSH: TSH: 2.14 mIU/L

## 2017-03-10 LAB — D-DIMER, QUANTITATIVE: D-Dimer, Quant: 0.64 mcg/mL FEU — ABNORMAL HIGH (ref <0.50)

## 2017-03-10 LAB — BRAIN NATRIURETIC PEPTIDE: Brain Natriuretic Peptide: 7.8 pg/mL (ref <100)

## 2017-03-10 LAB — VITAMIN B12: Vitamin B-12: 358 pg/mL (ref 200–1100)

## 2017-03-10 MED ORDER — IOPAMIDOL (ISOVUE-370) INJECTION 76%
100.0000 mL | Freq: Once | INTRAVENOUS | Status: AC | PRN
  Administered 2017-03-10: 100 mL via INTRAVENOUS

## 2017-03-10 MED ORDER — VITAMIN D (ERGOCALCIFEROL) 1.25 MG (50000 UNIT) PO CAPS: 50000.0000 [IU] | ORAL_CAPSULE | ORAL | 1 refills | Status: AC

## 2017-03-10 NOTE — Progress Notes (Signed)
Will ask lab to check into who was contacted about positive stat D-dimer Try to reach patient about the D-dimer again and send her for stat Chest CT PE protocol

## 2017-03-10 NOTE — Progress Notes (Signed)
Start Rx vit D 

## 2017-03-10 NOTE — Telephone Encounter (Signed)
Angie will check at lab as to why the did not call?

## 2017-03-11 ENCOUNTER — Telehealth: Payer: Self-pay

## 2017-03-11 NOTE — Telephone Encounter (Signed)
Tried to contact pt for an opening for 03/15/17 at 3pm. Told pt was at an appt and they would have her to call back.

## 2017-03-11 NOTE — Telephone Encounter (Signed)
LMOM for pt to return call. 

## 2017-03-11 NOTE — Telephone Encounter (Signed)
Per New patient referral note needs consult asap Please advise

## 2017-03-11 NOTE — Telephone Encounter (Signed)
Spoke with pt and she has been scheduled. Nothing further needed.

## 2017-03-15 ENCOUNTER — Encounter: Payer: Self-pay | Admitting: Pulmonary Disease

## 2017-03-15 ENCOUNTER — Ambulatory Visit (INDEPENDENT_AMBULATORY_CARE_PROVIDER_SITE_OTHER): Payer: Medicaid Other | Admitting: Pulmonary Disease

## 2017-03-15 DIAGNOSIS — R0609 Other forms of dyspnea: Secondary | ICD-10-CM

## 2017-03-15 DIAGNOSIS — Z87891 Personal history of nicotine dependence: Secondary | ICD-10-CM | POA: Diagnosis not present

## 2017-03-15 DIAGNOSIS — R635 Abnormal weight gain: Secondary | ICD-10-CM | POA: Diagnosis not present

## 2017-03-15 DIAGNOSIS — G4733 Obstructive sleep apnea (adult) (pediatric): Secondary | ICD-10-CM

## 2017-03-15 NOTE — Patient Instructions (Addendum)
Trial of Symbicort inhaler - 2 inhalations daily twice a day. Sample has been provided. If you feel that this has helped your breathing, call here and we will place a prescription for this  Split-night sleep study has been ordered  Follow-up in 3-4 weeks with lung function tests (PFTs) prior

## 2017-03-18 ENCOUNTER — Ambulatory Visit: Admission: RE | Admit: 2017-03-18 | Payer: Medicaid Other | Source: Ambulatory Visit

## 2017-03-19 ENCOUNTER — Ambulatory Visit (INDEPENDENT_AMBULATORY_CARE_PROVIDER_SITE_OTHER): Payer: Medicaid Other | Admitting: Family Medicine

## 2017-03-19 ENCOUNTER — Encounter: Payer: Self-pay | Admitting: Family Medicine

## 2017-03-19 DIAGNOSIS — E786 Lipoprotein deficiency: Secondary | ICD-10-CM

## 2017-03-19 DIAGNOSIS — E781 Pure hyperglyceridemia: Secondary | ICD-10-CM

## 2017-03-19 DIAGNOSIS — Z124 Encounter for screening for malignant neoplasm of cervix: Secondary | ICD-10-CM | POA: Insufficient documentation

## 2017-03-19 DIAGNOSIS — E119 Type 2 diabetes mellitus without complications: Secondary | ICD-10-CM

## 2017-03-19 DIAGNOSIS — R0602 Shortness of breath: Secondary | ICD-10-CM

## 2017-03-19 DIAGNOSIS — L680 Hirsutism: Secondary | ICD-10-CM

## 2017-03-19 DIAGNOSIS — Z6841 Body Mass Index (BMI) 40.0 and over, adult: Secondary | ICD-10-CM | POA: Diagnosis not present

## 2017-03-19 LAB — POCT GLYCOSYLATED HEMOGLOBIN (HGB A1C): Hemoglobin A1C: 7.2

## 2017-03-19 MED ORDER — METFORMIN HCL ER 500 MG PO TB24: 500.0000 mg | ORAL_TABLET | Freq: Every day | ORAL | 2 refills | Status: DC

## 2017-03-19 MED ORDER — LIRAGLUTIDE 18 MG/3ML ~~LOC~~ SOPN: PEN_INJECTOR | SUBCUTANEOUS | 0 refills | Status: DC

## 2017-03-19 NOTE — Progress Notes (Signed)
BP 132/86   Pulse 97   Temp 98.3 F (36.8 C) (Oral)   Resp 16   Wt (!) 351 lb 4.8 oz (159.3 kg)   LMP 09/07/2015 (LMP Unknown) Comment: oophorectomy, no period since  SpO2 95%   BMI 56.36 kg/m    Subjective:    Patient ID: Krista Mcgee, female    DOB: 1976/03/20, 41 y.o.   MRN: [id number]  HPI: Krista Mcgee is a 41 y.o. female  Chief Complaint  Patient presents with  . Follow-up    abdormal CT test   . Referral    GYN due to hormones     HPI   She says that she saw the pulmonologist; he has ordered PFTs and a sleep study She had the chest CT done, no PE, he reviewed that too He started her on the Symbicort and it is helping some; weight not the cause but not helping Losing weight  Low HDL; father and PGM had heart attacks Not taking fish oil or krill oil  Vitamin B12 deficiency; vitamin D deficiency  Diabetes; some dry mouth; blurred vision at times; last eye exam a few years ago Father has diabetes; mother's father had DM; husband has DM Open to nutrition referral Open to medicine She found out she had diabetes about a year ago   Depression screen [facility] 2/9 03/19/2017 03/09/2017 07/29/2015  Decreased Interest 0 0 0  Down, Depressed, Hopeless 1 1 3   PHQ - 2 Score 1 1 3   Altered sleeping - - 3  Tired, decreased energy - - 3  Change in appetite - - 0  Feeling bad or failure about yourself  - - 1  Trouble concentrating - - 2  Moving slowly or fidgety/restless - - 0  Suicidal thoughts - - 0  PHQ-9 Score - - 12   Relevant past medical, surgical, family and social history reviewed Past Medical History:  Diagnosis Date  . Anxiety   . Depression   . Diabetes mellitus without complication ([place])   . Hypertension    Past Surgical History:  Procedure Laterality Date  . CHOLECYSTECTOMY    . OOPHORECTOMY     Family History  Problem Relation Age of Onset  . Cancer Mother        ovarian  . Other Father        tumor in his ear drum  . Diabetes Father     . Heart disease Father   . Hyperlipidemia Father   . Hypertension Father   . Kidney disease Daughter        hydronephrosis  . Arthritis Paternal Grandmother   . COPD Paternal Grandmother   . Emphysema Paternal Grandmother   . Heart disease Paternal Grandmother   . Cancer Paternal Grandfather        skin  . COPD Paternal Grandfather   . Emphysema Paternal Grandfather    Social History   Social History  . Marital status: Married    Spouse name: N/A  . Number of children: N/A  . Years of education: N/A   Occupational History  . Not on file.   Social History Main Topics  . Smoking status: Former Research scientist (life sciences)  . Smokeless tobacco: Never Used     Comment: has not smoked in about 66yrs.  . Alcohol use 0.0 oz/week     Comment: occasionally  . Drug use: No  . Sexual activity: Yes    Partners: Male    Birth control/ protection: None  Other Topics Concern  . Not on file   Social History Narrative  . No narrative on file    Interim medical history since last visit reviewed. Allergies and medications reviewed  Review of Systems Per HPI unless specifically indicated above     Objective:    BP 132/86   Pulse 97   Temp 98.3 F (36.8 C) (Oral)   Resp 16   Wt (!) 351 lb 4.8 oz (159.3 kg)   LMP 09/07/2015 (LMP Unknown) Comment: oophorectomy, no period since  SpO2 95%   BMI 56.36 kg/m   Wt Readings from Last 3 Encounters:  03/19/17 (!) 351 lb 4.8 oz (159.3 kg)  03/15/17 (!) 352 lb (159.7 kg)  03/09/17 (!) 357 lb 3.2 oz (162 kg)    Physical Exam  Constitutional: She appears well-developed and well-nourished.  Morbidly obese, weight loss noted  HENT:  Head: Normocephalic and atraumatic.  Mouth/Throat: Mucous membranes are normal.  Eyes: EOM are normal. No scleral icterus.  Cardiovascular: Normal rate and regular rhythm.   Pulmonary/Chest: Effort normal and breath sounds normal.  Abdominal: Soft. Bowel sounds are normal. She exhibits no distension.  Musculoskeletal:  She exhibits no edema.  Neurological: She is alert.  Skin: No pallor.  Psychiatric: She has a normal mood and affect. Her behavior is normal.   Diabetic Foot Form - Detailed   Diabetic Foot Exam - detailed Diabetic Foot exam was performed with the following findings:  Yes 03/19/2017  1:45 PM  Visual Foot Exam completed.:  Yes  Pulse Foot Exam completed.:  Yes  Right Dorsalis Pedis:  Present Left Dorsalis Pedis:  Present  Sensory Foot Exam Completed.:  Yes Semmes-Weinstein Monofilament Test R Site 1-Great Toe:  Pos L Site 1-Great Toe:  Pos        Results for orders placed or performed in visit on 03/19/17  POCT HgB A1C  Result Value Ref Range   Hemoglobin A1C 7.2       Assessment & Plan:   Problem List Items Addressed This Visit      Endocrine   Type 2 diabetes mellitus ([place])    Refer to diabetic education; check A1c here      Relevant Medications   metFORMIN (GLUCOPHAGE XR) 500 MG 24 hr tablet   Other Relevant Orders   POCT HgB A1C (Completed)   Ambulatory referral to diabetic education     Musculoskeletal and Integument   Hirsutism    Refer to gynecologist      Relevant Orders   Ambulatory referral to Gynecology     Other   Pap smear for cervical cancer screening    Remote hx of abnormal pap smear; due for one now, requesting GYN referral      Relevant Orders   Ambulatory referral to Gynecology   Shortness of breath    Seeing pulmonologist; CT scan negative for PE      Morbid obesity with BMI of 50.0-59.9, adult ([place])    encouragement given; I am here to help; pointed out weight loss thus far      Relevant Medications   metFORMIN (GLUCOPHAGE XR) 500 MG 24 hr tablet   Low HDL (under 40)    Likely related to morbid obesity; encouragement given for weight loss      High triglycerides    Likely related to low HDL and morbid obesity; work on weight loss and start krill oil          Follow up plan: Return in about  3 months (around 06/19/2017) for  twenty minute follow-up with fasting labs; 4 weeks other things.  An after-visit summary was printed and given to the patient at [place].  Please see the patient instructions which may contain other information and recommendations beyond what is mentioned above in the assessment and plan.  Meds ordered this encounter  Medications  . budesonide-formoterol (SYMBICORT) 160-4.5 MCG/ACT inhaler    Sig: Inhale 2 puffs into the lungs 2 (two) times daily.  . metFORMIN (GLUCOPHAGE XR) 500 MG 24 hr tablet    Sig: Take 1 tablet (500 mg total) by mouth daily with breakfast.    Dispense:  30 tablet    Refill:  2  . DISCONTD: liraglutide (VICTOZA) 18 MG/3ML SOPN    Sig: Take 0.6 mg subcutaneously daily for 1 week, then 1.2 mg daily for 1 week, then 1.8 mg daily    Dispense:  5 pen    Refill:  0    Orders Placed This Encounter  Procedures  . Ambulatory referral to Gynecology  . Ambulatory referral to diabetic education  . POCT HgB A1C

## 2017-03-19 NOTE — Assessment & Plan Note (Signed)
Refer to diabetic education; check A1c here

## 2017-03-19 NOTE — Assessment & Plan Note (Signed)
Refer to gynecologist

## 2017-03-19 NOTE — Assessment & Plan Note (Signed)
Remote hx of abnormal pap smear; due for one now, requesting GYN referral

## 2017-03-19 NOTE — Patient Instructions (Addendum)
Try krill oil twice a day Vitamin B12 500 or 1000 mcg per day, consider sublingual Please do see your eye doctor regularly, and have your eyes examined every year (or more often per his or her recommendation) Check your feet every night and let me know right away of any sores, infections, numbness, etc. Try to limit sweets, white bread, white rice, white potatoes It is okay with me for you to not check your fingerstick blood sugars (per [employer] of Endocrinology Best Practices), unless you are interested and feel it would be helpful for you

## 2017-03-21 NOTE — Progress Notes (Signed)
PULMONARY CONSULT NOTE  Requesting MD/Service: Lada Date of initial consultation: 07/23.18 Reason for consultation: Dyspnea  PT PROFILE: 41 y.o. female former smoker referred for DOE over past 18 mos  DATA: CT chest 09/11/15: No PE. Patchy L sided GGOs and areas of consolidation CT chest 03/10/17: NAD  HPI:  As above. She dates the onset of her symptoms to a pneumonia diagnosed in Jan 2017. Her symptoms have been gradually progressive. Presently she describes Class III-IV DOE. There is little DTD variation and no seasonal component but her symptoms are worse in heat and humidity. She does have episodic chest tightness and occasional ankle edema. Denies fever, purulent sputum, hemoptysis and calf tenderness. She notes a 60# weight gain in the last year but insists that her symptoms were present prior to this weight gain.  She is a heavy snorer and believes that she has OSA but has never been formally tested. She uses a family members CPAP machine. She does not know the setting   Past Medical History:  Diagnosis Date  . Anxiety   . Depression   . Diabetes mellitus without complication ([place])   . Hypertension     Past Surgical History:  Procedure Laterality Date  . CHOLECYSTECTOMY    . OOPHORECTOMY      MEDICATIONS: I have reviewed all medications and confirmed regimen as documented  Social History   Social History  . Marital status: Married    Spouse name: N/A  . Number of children: N/A  . Years of education: N/A   Occupational History  . Not on file.   Social History Main Topics  . Smoking status: Former Research scientist (life sciences)  . Smokeless tobacco: Never Used     Comment: has not smoked in about 63yrs.  . Alcohol use 0.0 oz/week     Comment: occasionally  . Drug use: No  . Sexual activity: Yes    Partners: Male    Birth control/ protection: None   Other Topics Concern  . Not on file   Social History Narrative  . No narrative on file    Family History  Problem Relation  Age of Onset  . Cancer Mother        ovarian  . Other Father        tumor in his ear drum  . Diabetes Father   . Heart disease Father   . Hyperlipidemia Father   . Hypertension Father   . Kidney disease Daughter        hydronephrosis  . Arthritis Paternal Grandmother   . COPD Paternal Grandmother   . Emphysema Paternal Grandmother   . Heart disease Paternal Grandmother   . Cancer Paternal Grandfather        skin  . COPD Paternal Grandfather   . Emphysema Paternal Grandfather     ROS: No fever, myalgias/arthralgias, unexplained weight loss or weight gain No new focal weakness or sensory deficits No otalgia, hearing loss, visual changes, nasal and sinus symptoms, mouth and throat problems No neck pain or adenopathy No abdominal pain, N/V/D, diarrhea, change in bowel pattern No dysuria, change in urinary pattern   Vitals:   03/15/17 1500 03/15/17 1501  BP:  (!) 134/92  Pulse:  (!) 103  Resp: 16   SpO2:  96%  Weight: (!) 352 lb (159.7 kg)   Height: 5' 6.2" (1.681 m)      EXAM:  Gen: Obese, pleasant, No overt respiratory distress HEENT: NCAT, sclera white, oropharynx normal Neck: Supple without LAN, thyromegaly. JVP  cannot be assessed Lungs: breath sounds slightly distant without adventitious sounds Cardiovascular: RRR, no murmurs noted Abdomen: Obese, soft, nontender, normal BS Ext: without clubbing, cyanosis, edema Neuro: CNs grossly intact, motor and sensory intact Skin: Limited exam, no lesions noted  DATA:   BMP Latest Ref Rng & Units 03/09/2017 12/22/2016 09/11/2015  Glucose 65 - 99 mg/dL 142(H) 115(H) 135(H)  BUN 7 - 25 mg/dL 10 12 10   Creatinine 0.50 - 1.10 mg/dL 0.88 0.82 0.75  BUN/Creat Ratio 8 - 20 - - -  Sodium 135 - 146 mmol/L 137 136 137  Potassium 3.5 - 5.3 mmol/L 4.1 4.1 3.7  Chloride 98 - 110 mmol/L 102 104 103  CO2 20 - 31 mmol/L 22 25 26   Calcium 8.6 - 10.2 mg/dL 9.0 8.9 8.6(L)    CBC Latest Ref Rng & Units 03/09/2017 12/22/2016 09/11/2015  WBC  3.8 - 10.8 K/uL 11.5(H) 13.5(H) 10.2  Hemoglobin 11.7 - 15.5 g/dL 13.0 12.0 11.9(L)  Hematocrit 35.0 - 45.0 % 40.2 37.2 35.6  Platelets 140 - 400 K/uL 279 297 338    CXR (12/22/16):  NACPD  IMPRESSION:     ICD-10-CM   1. Morbid (severe) obesity due to excess calories (HCC) E66.01 Split night study  2. DOE (dyspnea on exertion) R06.09 Pulmonary Function Test [facility] Only  3. Former smoker Z87.891 Pulmonary Function Test [facility] Only  4. Weight gain R63.5   5. Likely OSA (obstructive sleep apnea) G47.33 Split night study     PLAN:  Trial of Symbicort inhaler - 2 actuations BID.Sample provided and Rx entered Split night sleep study ordered ROV 3-4 weeks with PFTs prior   Merton Border, MD PCCM service Mobile [phone] Pager [phone] 03/21/2017 4:10 PM

## 2017-03-22 ENCOUNTER — Encounter: Payer: Self-pay | Admitting: Family Medicine

## 2017-03-22 MED ORDER — LIRAGLUTIDE 18 MG/3ML ~~LOC~~ SOPN: PEN_INJECTOR | SUBCUTANEOUS | 0 refills | Status: DC

## 2017-03-23 ENCOUNTER — Other Ambulatory Visit: Payer: Self-pay

## 2017-03-23 DIAGNOSIS — L680 Hirsutism: Secondary | ICD-10-CM

## 2017-03-23 DIAGNOSIS — Z124 Encounter for screening for malignant neoplasm of cervix: Secondary | ICD-10-CM

## 2017-03-29 DIAGNOSIS — E785 Hyperlipidemia, unspecified: Secondary | ICD-10-CM | POA: Insufficient documentation

## 2017-03-29 DIAGNOSIS — E786 Lipoprotein deficiency: Secondary | ICD-10-CM | POA: Insufficient documentation

## 2017-03-29 DIAGNOSIS — E781 Pure hyperglyceridemia: Secondary | ICD-10-CM | POA: Insufficient documentation

## 2017-03-29 NOTE — Assessment & Plan Note (Signed)
Likely related to morbid obesity; encouragement given for weight loss

## 2017-03-29 NOTE — Assessment & Plan Note (Signed)
Seeing pulmonologist; CT scan negative for PE

## 2017-03-29 NOTE — Assessment & Plan Note (Signed)
Likely related to low HDL and morbid obesity; work on weight loss and start krill oil

## 2017-03-29 NOTE — Assessment & Plan Note (Signed)
encouragement given; I am here to help; pointed out weight loss thus far

## 2017-04-12 ENCOUNTER — Ambulatory Visit: Payer: Medicaid Other | Admitting: Internal Medicine

## 2017-04-13 ENCOUNTER — Ambulatory Visit: Payer: Medicaid Other | Attending: Pulmonary Disease

## 2017-04-16 ENCOUNTER — Ambulatory Visit: Payer: Medicaid Other | Admitting: Family Medicine

## 2017-04-19 ENCOUNTER — Ambulatory Visit: Payer: Medicaid Other | Admitting: Pulmonary Disease

## 2017-04-20 ENCOUNTER — Encounter: Payer: Self-pay | Admitting: Pulmonary Disease

## 2017-04-27 ENCOUNTER — Ambulatory Visit: Payer: Medicaid Other | Admitting: *Deleted

## 2017-05-22 ENCOUNTER — Other Ambulatory Visit: Payer: Self-pay | Admitting: Family Medicine

## 2017-05-27 ENCOUNTER — Encounter: Payer: Self-pay | Admitting: Obstetrics and Gynecology

## 2017-06-09 IMAGING — CR DG ANKLE COMPLETE 3+V*L*
1 series · 3 of 3 positions shown · non-contrast
Comparison: None.

CLINICAL DATA: Pt fell down three steps today rolling left ankle,
c/o lateral left ankle pain/swelling, no prev surgery.

EXAM:
LEFT ANKLE COMPLETE - 3+ VIEW

[Series 1: ap · 0.17mm/px · 3 of 3 slices shown]
[im 1/3]
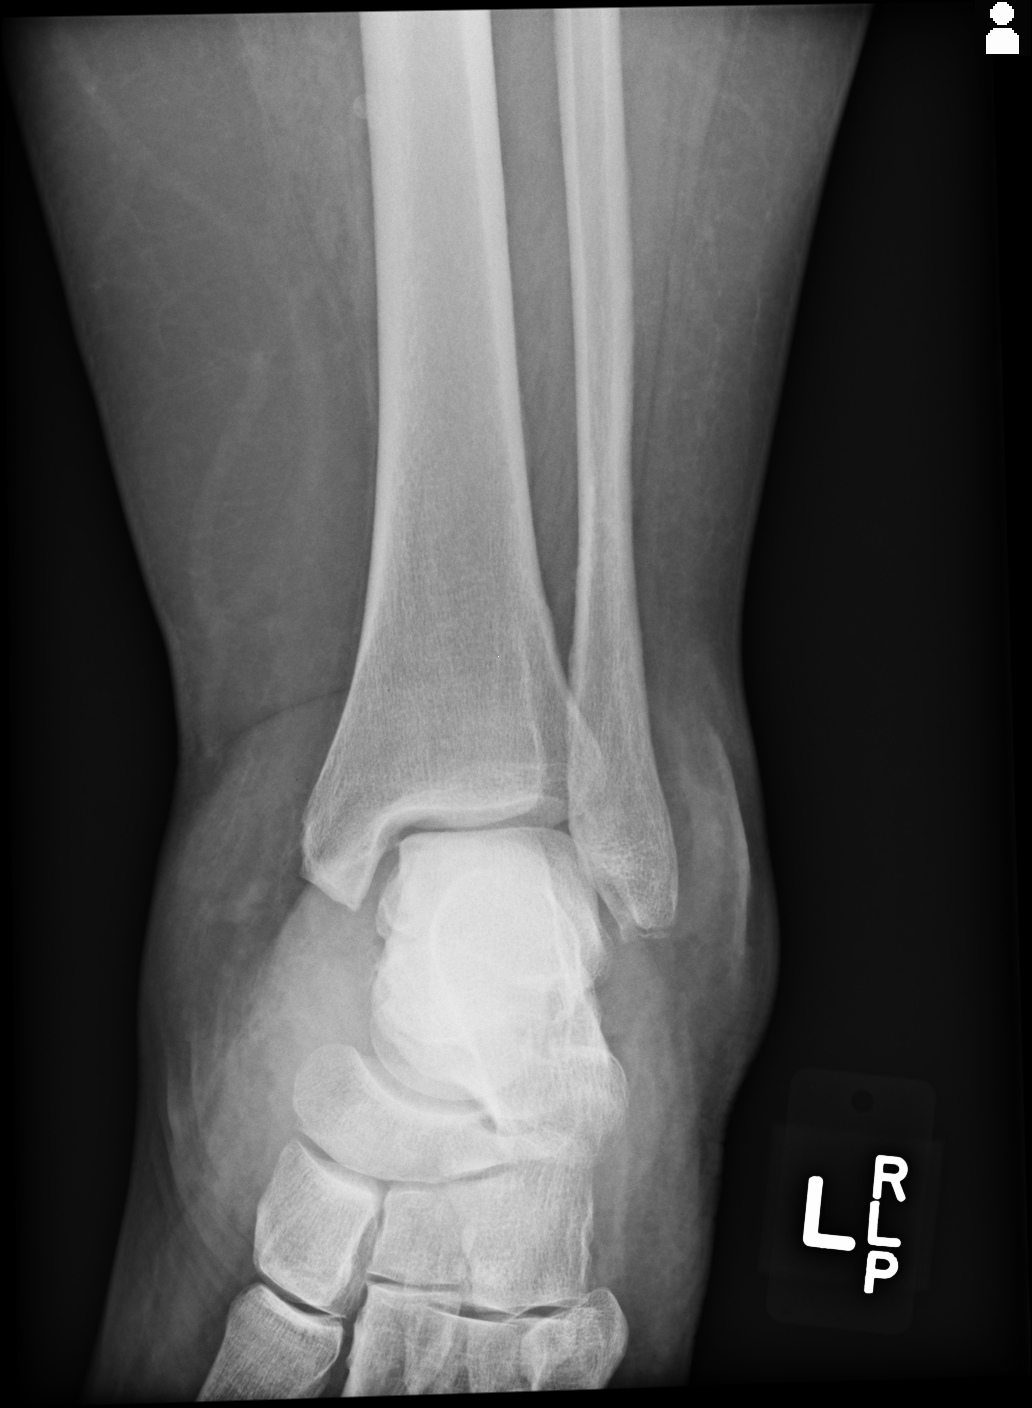
[im 2/3]
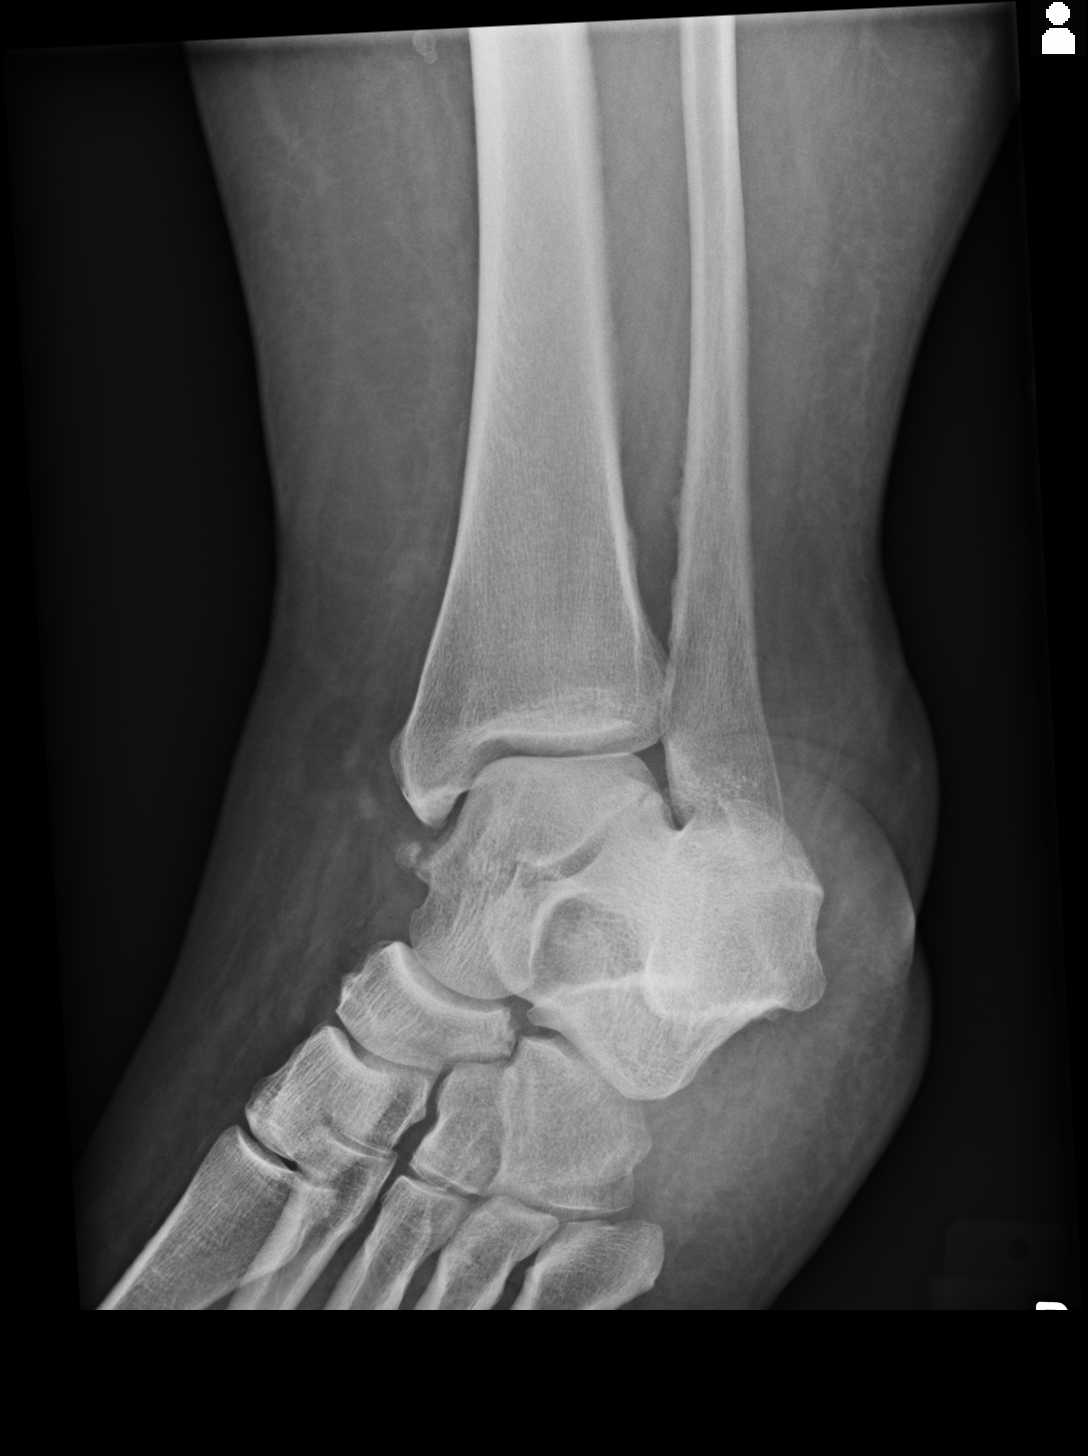
[im 3/3]
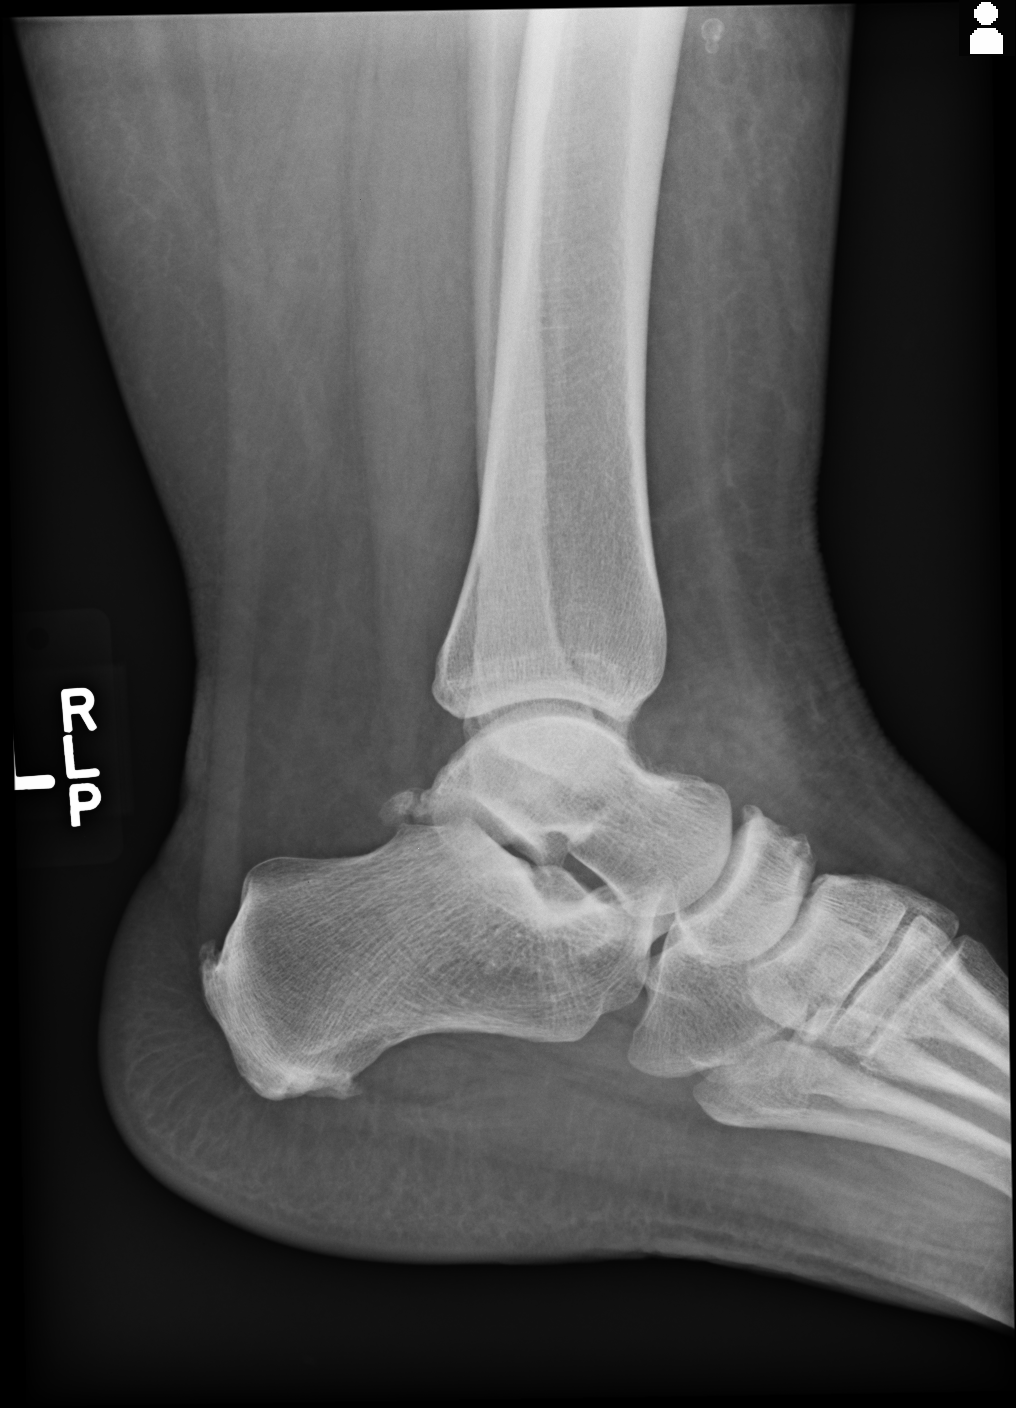

[3 of 3 positions shown; findings below may reference images not displayed]

FINDINGS: Small sliver of bone lies along the inferior margin of the distal
fibula which may reflect an acute avulsion fracture or be chronic.

No other evidence of a fracture. Ankle mortise is normally spaced
and aligned.

There is diffuse soft tissue swelling most evident laterally.

There are small plantar and dorsal calcaneal spurs.
IMPRESSION: 1. Possible acute, small, nondisplaced avulsion fracture from the
inferior margin of the distal fibula. No other evidence of a
fracture. No dislocation.

## 2017-06-21 ENCOUNTER — Ambulatory Visit: Payer: Medicaid Other | Admitting: Family Medicine

## 2017-10-10 NOTE — Telephone Encounter (Signed)
Old Mychart message; signing off

## 2017-10-15 ENCOUNTER — Other Ambulatory Visit: Payer: Self-pay | Admitting: Family Medicine

## 2017-10-16 NOTE — Telephone Encounter (Signed)
One month approved Due for appointment Message on sig to please schedule an appt

## 2017-12-18 ENCOUNTER — Other Ambulatory Visit: Payer: Self-pay | Admitting: Family Medicine

## 2017-12-21 NOTE — Telephone Encounter (Signed)
Please ask the patient to schedule an appointment I would love to see her to help her with her health issues We need some labs and I look forward to seeing her soon This will be the last refill I can give her without seeing her because it is so important to monitor her condition, so we hope she'll come in soon

## 2017-12-21 NOTE — Telephone Encounter (Signed)
Spoke with pt and she did schedule for 01-19-18

## 2018-01-19 ENCOUNTER — Ambulatory Visit: Payer: Medicaid Other | Admitting: Family Medicine

## 2018-01-19 ENCOUNTER — Encounter: Payer: Self-pay | Admitting: Family Medicine

## 2018-01-19 VITALS — BP 136/86 | HR 96 | Temp 98.2°F | Resp 14 | Ht 66.0 in | Wt 360.7 lb

## 2018-01-19 DIAGNOSIS — Z5181 Encounter for therapeutic drug level monitoring: Secondary | ICD-10-CM

## 2018-01-19 DIAGNOSIS — L680 Hirsutism: Secondary | ICD-10-CM | POA: Diagnosis not present

## 2018-01-19 DIAGNOSIS — Z6841 Body Mass Index (BMI) 40.0 and over, adult: Secondary | ICD-10-CM

## 2018-01-19 DIAGNOSIS — R0602 Shortness of breath: Secondary | ICD-10-CM | POA: Diagnosis not present

## 2018-01-19 DIAGNOSIS — Z01419 Encounter for gynecological examination (general) (routine) without abnormal findings: Secondary | ICD-10-CM

## 2018-01-19 DIAGNOSIS — E119 Type 2 diabetes mellitus without complications: Secondary | ICD-10-CM | POA: Diagnosis not present

## 2018-01-19 DIAGNOSIS — E786 Lipoprotein deficiency: Secondary | ICD-10-CM

## 2018-01-19 DIAGNOSIS — Z23 Encounter for immunization: Secondary | ICD-10-CM | POA: Diagnosis not present

## 2018-01-19 DIAGNOSIS — E781 Pure hyperglyceridemia: Secondary | ICD-10-CM | POA: Diagnosis not present

## 2018-01-19 DIAGNOSIS — F3342 Major depressive disorder, recurrent, in full remission: Secondary | ICD-10-CM | POA: Diagnosis not present

## 2018-01-19 DIAGNOSIS — E559 Vitamin D deficiency, unspecified: Secondary | ICD-10-CM | POA: Diagnosis not present

## 2018-01-19 DIAGNOSIS — R0683 Snoring: Secondary | ICD-10-CM

## 2018-01-19 MED ORDER — METFORMIN HCL ER 500 MG PO TB24: ORAL_TABLET | ORAL | 0 refills | Status: DC

## 2018-01-19 MED ORDER — BUDESONIDE-FORMOTEROL FUMARATE 160-4.5 MCG/ACT IN AERO: 2.0000 | INHALATION_SPRAY | Freq: Two times a day (BID) | RESPIRATORY_TRACT | 11 refills | Status: DC

## 2018-01-19 MED ORDER — EMPAGLIFLOZIN 10 MG PO TABS: 10.0000 mg | ORAL_TABLET | Freq: Every day | ORAL | 5 refills | Status: DC

## 2018-01-19 NOTE — Assessment & Plan Note (Signed)
Refer to GYN. 

## 2018-01-19 NOTE — Assessment & Plan Note (Signed)
Seen by pulm; no mention of COPD in note, but symbicort is helping; refill provided; note to pulm

## 2018-01-19 NOTE — Patient Instructions (Addendum)
We'll get labs today We've put in referrals If you have not heard anything from my staff in a week about any orders/referrals/studies from today, please contact us here to follow-up [phone] You have received the Pneumovax vaccine (PPSV-23) and you will not need another booster of this for the rest of your life per current ACIP guidelines

## 2018-01-19 NOTE — Assessment & Plan Note (Signed)
Foot exam by MD today; check A1c, other labs; start back on metformin; add SGLT-2 inhibitor

## 2018-01-19 NOTE — Progress Notes (Signed)
BP 136/86   Pulse 96   Temp 98.2 F (36.8 C) (Oral)   Resp 14   Ht 5\' 6"  (1.676 m)   Wt (!) 360 lb 11.2 oz (163.6 kg)   LMP 09/07/2015 (LMP Unknown) Comment: oophorectomy, no period since  SpO2 94%   BMI 58.22 kg/m    Subjective:    Patient ID: Krista Mcgee, female    DOB: 23-Jul-1976, 42 y.o.   MRN: [id number]  HPI: Krista Mcgee is a 42 y.o. female  Chief Complaint  Patient presents with  . Follow-up  . Medication Refill  . Referral    for gyno for pap and hormones    HPI Patient is here for f/u  Type 2 diabetes; the shot made her sick; weight gain; no problems with feet Lab Results  Component Value Date   HGBA1C 7.2 03/19/2017   DOE; not sure if COPD, not listed by PULM; symbicort really helped; concern for OSA; needs to have sleep study and pulm function studies  Mood is tough; depression is really bad lately; sees psychiatrist next month; anxiety about health  Morbid obesity; weight up 9 pounds over last 10 months; can't go anywhere because she can't walk or do steps; back pain, knee pain; interested in bariatric surgery referral; she will be 42 in a few weeks  She has hirsutism and would like to see a GYN for her well woman exam, pap smear, and to discuss the abnormal hair growth; she plucks the hairs under her chin right now, does not want to shave  Depression screen [facility] 2/9 01/19/2018 03/19/2017 03/09/2017 07/29/2015  Decreased Interest 0 0 0 0  Down, Depressed, Hopeless 1 1 1 3   PHQ - 2 Score 1 1 1 3   Altered sleeping - - - 3  Tired, decreased energy - - - 3  Change in appetite - - - 0  Feeling bad or failure about yourself  - - - 1  Trouble concentrating - - - 2  Moving slowly or fidgety/restless - - - 0  Suicidal thoughts - - - 0  PHQ-9 Score - - - 12    Relevant past medical, surgical, family and social history reviewed Past Medical History:  Diagnosis Date  . Anxiety   . Depression   . Diabetes mellitus without complication ([place])   .  Hypertension    Past Surgical History:  Procedure Laterality Date  . CHOLECYSTECTOMY    . OOPHORECTOMY     Family History  Problem Relation Age of Onset  . Cancer Mother        ovarian  . Other Father        tumor in his ear drum  . Diabetes Father   . Heart disease Father   . Hyperlipidemia Father   . Hypertension Father   . Kidney disease Daughter        hydronephrosis  . Arthritis Paternal Grandmother   . COPD Paternal Grandmother   . Emphysema Paternal Grandmother   . Heart disease Paternal Grandmother   . Cancer Paternal Grandfather        skin  . COPD Paternal Grandfather   . Emphysema Paternal Grandfather    Social History   Tobacco Use  . Smoking status: Former Research scientist (life sciences)  . Smokeless tobacco: Never Used  . Tobacco comment: has not smoked in about 71yrs.  Substance Use Topics  . Alcohol use: Yes    Alcohol/week: 0.0 oz    Comment: occasionally  . Drug  use: No    Interim medical history since last visit reviewed. Allergies and medications reviewed  Review of Systems Per HPI unless specifically indicated above     Objective:    BP 136/86   Pulse 96   Temp 98.2 F (36.8 C) (Oral)   Resp 14   Ht 5\' 6"  (1.676 m)   Wt (!) 360 lb 11.2 oz (163.6 kg)   LMP 09/07/2015 (LMP Unknown) Comment: oophorectomy, no period since  SpO2 94%   BMI 58.22 kg/m   Wt Readings from Last 3 Encounters:  01/19/18 (!) 360 lb 11.2 oz (163.6 kg)  03/19/17 (!) 351 lb 4.8 oz (159.3 kg)  03/15/17 (!) 352 lb (159.7 kg)    Physical Exam  Constitutional: She appears well-developed and well-nourished. No distress.  Morbidly obese; weight gain noted  HENT:  Head: Normocephalic and atraumatic.  Eyes: EOM are normal. No scleral icterus.  Neck: No thyromegaly present.  Cardiovascular: Normal rate, regular rhythm and normal heart sounds.  No murmur heard. Pulmonary/Chest: Effort normal and breath sounds normal. No respiratory distress. She has no wheezes.  Abdominal: Soft. Bowel  sounds are normal. She exhibits no distension.  Musculoskeletal: Normal range of motion. She exhibits no edema.       Right foot: There is normal range of motion and no deformity.       Left foot: There is normal range of motion and no deformity.  Feet:  Right Foot:  Protective Sensation: 5 sites tested. 5 sites sensed.  Left Foot:  Protective Sensation: 5 sites tested. 5 sites sensed.  Neurological: She is alert. She exhibits normal muscle tone.  Skin: Skin is warm and dry. She is not diaphoretic. No pallor.  Increase in terminal hair growth under the chin  Psychiatric: She has a normal mood and affect. Her behavior is normal. Judgment and thought content normal.   Diabetic Foot Form - Detailed   Diabetic Foot Exam - detailed Diabetic Foot exam was performed with the following findings:  Yes 01/19/2018 10:14 AM  Visual Foot Exam completed.:  Yes  Pulse Foot Exam completed.:  Yes  Right Dorsalis Pedis:  Present Left Dorsalis Pedis:  Present  Sensory Foot Exam Completed.:  Yes Semmes-Weinstein Monofilament Test R Site 1-Great Toe:  Pos L Site 1-Great Toe:  Pos         Results for orders placed or performed in visit on 03/19/17  POCT HgB A1C  Result Value Ref Range   Hemoglobin A1C 7.2       Assessment & Plan:   Problem List Items Addressed This Visit      Endocrine   Type 2 diabetes mellitus ([place]) - Primary (Chronic)    Foot exam by MD today; check A1c, other labs; start back on metformin; add SGLT-2 inhibitor      Relevant Medications   metFORMIN (GLUCOPHAGE-XR) 500 MG 24 hr tablet   empagliflozin (JARDIANCE) 10 MG TABS tablet   Other Relevant Orders   Ambulatory referral to Ophthalmology   Hemoglobin A1C   Urine Microalbumin w/creat. ratio   Lipid panel     Musculoskeletal and Integument   Hirsutism    Refer to GYN      Relevant Orders   Ambulatory referral to Gynecology     Other   Medication monitoring encounter   Relevant Orders   COMPLETE METABOLIC  PANEL WITH GFR   Vitamin D deficiency   Relevant Orders   VITAMIN D 25 Hydroxy (Vit-D Deficiency, Fractures)  Low HDL (under 40)   High triglycerides   Snoring    Patient is ready to have sleep study done to evaluate for OSA; note to pulmonologist      Shortness of breath    Seen by pulm; no mention of COPD in note, but symbicort is helping; refill provided; note to pulm      Relevant Medications   budesonide-formoterol (SYMBICORT) 160-4.5 MCG/ACT inhaler   Other Relevant Orders   CBC with Differential/Platelet   Morbid obesity with BMI of 50.0-59.9, adult ([place])    Encouraged patient to work on weight loss; she did not tolerate GLP-1; will add SGLT-2 I; refer to bariatric surgeon      Relevant Medications   metFORMIN (GLUCOPHAGE-XR) 500 MG 24 hr tablet   empagliflozin (JARDIANCE) 10 MG TABS tablet   Other Relevant Orders   Amb Referral to Bariatric Surgery   TSH   Major depressive disorder, recurrent, in full remission with anxious distress ([place])    Managed by psych       Other Visit Diagnoses    Well woman exam       Relevant Orders   Ambulatory referral to Gynecology   Need for 23-polyvalent pneumococcal polysaccharide vaccine       recommended and given today; next booster on or after 40 years of age   BMI 50.0-59.9, adult ([place])       Relevant Medications   metFORMIN (GLUCOPHAGE-XR) 500 MG 24 hr tablet   empagliflozin (JARDIANCE) 10 MG TABS tablet       Follow up plan: Return in about 2 weeks (around 02/02/2018) for follow-up visit with Dr. Sanda Klein for back and knees.  An after-visit summary was printed and given to the patient at [place].  Please see the patient instructions which may contain other information and recommendations beyond what is mentioned above in the assessment and plan.  Meds ordered this encounter  Medications  . metFORMIN (GLUCOPHAGE-XR) 500 MG 24 hr tablet    Sig: One by mouth daily for five days, then two pills daily    Dispense:  55  tablet    Refill:  0  . empagliflozin (JARDIANCE) 10 MG TABS tablet    Sig: Take 10 mg by mouth daily.    Dispense:  30 tablet    Refill:  5  . budesonide-formoterol (SYMBICORT) 160-4.5 MCG/ACT inhaler    Sig: Inhale 2 puffs into the lungs 2 (two) times daily.    Dispense:  1 Inhaler    Refill:  11    Orders Placed This Encounter  Procedures  . Pneumococcal polysaccharide vaccine 23-valent greater than or equal to 2yo subcutaneous/IM  . Hemoglobin A1C  . Urine Microalbumin w/creat. ratio  . VITAMIN D 25 Hydroxy (Vit-D Deficiency, Fractures)  . TSH  . COMPLETE METABOLIC PANEL WITH GFR  . CBC with Differential/Platelet  . Lipid panel  . Ambulatory referral to Ophthalmology  . Ambulatory referral to Gynecology  . Amb Referral to Bariatric Surgery

## 2018-01-19 NOTE — Assessment & Plan Note (Signed)
Managed by psych 

## 2018-01-19 NOTE — Assessment & Plan Note (Signed)
Encouraged patient to work on weight loss; she did not tolerate GLP-1; will add SGLT-2 I; refer to bariatric surgeon

## 2018-01-19 NOTE — Assessment & Plan Note (Signed)
Patient is ready to have sleep study done to evaluate for OSA; note to pulmonologist

## 2018-01-20 LAB — CBC WITH DIFFERENTIAL/PLATELET
BASOS ABS: 48 {cells}/uL (ref 0–200)
Basophils Relative: 0.4 %
EOS PCT: 2.3 %
Eosinophils Absolute: 274 cells/uL (ref 15–500)
HCT: 37.9 % (ref 35.0–45.0)
HEMOGLOBIN: 12.5 g/dL (ref 11.7–15.5)
LYMPHS ABS: 3618 {cells}/uL (ref 850–3900)
MCH: 26.8 pg — ABNORMAL LOW (ref 27.0–33.0)
MCHC: 33 g/dL (ref 32.0–36.0)
MCV: 81.3 fL (ref 80.0–100.0)
MONOS PCT: 3 %
MPV: 9.1 fL (ref 7.5–12.5)
NEUTROS ABS: 7604 {cells}/uL (ref 1500–7800)
NEUTROS PCT: 63.9 %
Platelets: 316 10*3/uL (ref 140–400)
RBC: 4.66 10*6/uL (ref 3.80–5.10)
RDW: 14.5 % (ref 11.0–15.0)
Total Lymphocyte: 30.4 %
WBC mixed population: 357 cells/uL (ref 200–950)
WBC: 11.9 10*3/uL — ABNORMAL HIGH (ref 3.8–10.8)

## 2018-01-20 LAB — COMPLETE METABOLIC PANEL WITH GFR
AG RATIO: 1.2 (calc) (ref 1.0–2.5)
ALT: 14 U/L (ref 6–29)
AST: 14 U/L (ref 10–30)
Albumin: 3.8 g/dL (ref 3.6–5.1)
Alkaline phosphatase (APISO): 78 U/L (ref 33–115)
BILIRUBIN TOTAL: 0.5 mg/dL (ref 0.2–1.2)
BUN: 15 mg/dL (ref 7–25)
CALCIUM: 8.6 mg/dL (ref 8.6–10.2)
CHLORIDE: 103 mmol/L (ref 98–110)
CO2: 29 mmol/L (ref 20–32)
Creat: 0.78 mg/dL (ref 0.50–1.10)
GFR, EST AFRICAN AMERICAN: 111 mL/min/{1.73_m2} (ref >=60)
GFR, Est Non African American: 96 mL/min/{1.73_m2} (ref >=60)
GLUCOSE: 140 mg/dL — AB (ref 65–99)
Globulin: 3.1 g/dL (calc) (ref 1.9–3.7)
POTASSIUM: 4.4 mmol/L (ref 3.5–5.3)
Sodium: 138 mmol/L (ref 135–146)
TOTAL PROTEIN: 6.9 g/dL (ref 6.1–8.1)

## 2018-01-20 LAB — LIPID PANEL
CHOLESTEROL: 192 mg/dL (ref <200)
HDL: 37 mg/dL — AB (ref >50)
LDL Cholesterol (Calc): 119 mg/dL (calc) — ABNORMAL HIGH
Non-HDL Cholesterol (Calc): 155 mg/dL (calc) — ABNORMAL HIGH (ref <130)
TRIGLYCERIDES: 253 mg/dL — AB (ref <150)
Total CHOL/HDL Ratio: 5.2 (calc) — ABNORMAL HIGH (ref <5.0)

## 2018-01-20 LAB — MICROALBUMIN / CREATININE URINE RATIO
CREATININE, URINE: 112 mg/dL (ref 20–275)
MICROALB UR: 0.8 mg/dL
Microalb Creat Ratio: 7 mcg/mg creat (ref <30)

## 2018-01-20 LAB — HEMOGLOBIN A1C
EAG (MMOL/L): 8.1 (calc)
Hgb A1c MFr Bld: 6.7 % of total Hgb — ABNORMAL HIGH (ref <5.7)
Mean Plasma Glucose: 146 (calc)

## 2018-01-20 LAB — TSH: TSH: 2.22 m[IU]/L

## 2018-01-20 LAB — VITAMIN D 25 HYDROXY (VIT D DEFICIENCY, FRACTURES): VIT D 25 HYDROXY: 23 ng/mL — AB (ref 30–100)

## 2018-01-21 ENCOUNTER — Other Ambulatory Visit: Payer: Self-pay | Admitting: *Deleted

## 2018-01-21 DIAGNOSIS — G4719 Other hypersomnia: Secondary | ICD-10-CM

## 2018-01-21 DIAGNOSIS — R0609 Other forms of dyspnea: Principal | ICD-10-CM

## 2018-01-24 ENCOUNTER — Ambulatory Visit: Payer: Medicaid Other | Attending: Internal Medicine

## 2018-02-04 ENCOUNTER — Ambulatory Visit: Payer: Medicaid Other | Admitting: Family Medicine

## 2018-02-22 ENCOUNTER — Ambulatory Visit: Payer: Medicaid Other | Attending: Pulmonary Disease

## 2018-02-28 ENCOUNTER — Telehealth: Payer: Self-pay | Admitting: Pulmonary Disease

## 2018-02-28 NOTE — Telephone Encounter (Signed)
Per Protocol will forward to [facility] to call patient.

## 2018-02-28 NOTE — Telephone Encounter (Signed)
Patient calling to schedule pft please call can leave msg with appt

## 2018-03-01 ENCOUNTER — Other Ambulatory Visit: Payer: Self-pay | Admitting: *Deleted

## 2018-03-01 ENCOUNTER — Encounter: Payer: Medicaid Other | Admitting: Obstetrics and Gynecology

## 2018-03-01 DIAGNOSIS — R0609 Other forms of dyspnea: Principal | ICD-10-CM

## 2018-03-01 NOTE — Telephone Encounter (Signed)
Several PFT appointments have been scheduled for patient on 04/13/17 which was a no show, 02/22/18, no show.  Have been unable to speak with patient, therefore information has been left on pt's VM. LM for Pamala Hurry in Resp to return my call to schedule. Rhonda J Cobb

## 2018-03-01 NOTE — Telephone Encounter (Signed)
PFT appointment scheduled for Tues 03/08/18 at 1:30. Pt to arrive at 1:15 pm at [facility].  Left this appointment on pt's voice mail per her instructions in this phone message.  Advised the patient to contact me if she had any questions, and that I would mail out the instructions for this test along with the appointment date, time, location. Rhonda J Cobb Nothing else needed at this time. Rhonda J Cobb

## 2018-03-03 ENCOUNTER — Telehealth: Payer: Self-pay | Admitting: Family Medicine

## 2018-03-03 ENCOUNTER — Encounter: Payer: Self-pay | Admitting: Family Medicine

## 2018-03-03 ENCOUNTER — Ambulatory Visit: Payer: Medicaid Other | Admitting: Family Medicine

## 2018-03-03 VITALS — BP 136/86 | HR 98 | Temp 98.3°F | Ht 67.0 in | Wt 357.4 lb

## 2018-03-03 DIAGNOSIS — E119 Type 2 diabetes mellitus without complications: Secondary | ICD-10-CM | POA: Diagnosis not present

## 2018-03-03 DIAGNOSIS — Z1239 Encounter for other screening for malignant neoplasm of breast: Secondary | ICD-10-CM

## 2018-03-03 DIAGNOSIS — I498 Other specified cardiac arrhythmias: Secondary | ICD-10-CM | POA: Diagnosis not present

## 2018-03-03 DIAGNOSIS — Z1231 Encounter for screening mammogram for malignant neoplasm of breast: Secondary | ICD-10-CM

## 2018-03-03 DIAGNOSIS — R9431 Abnormal electrocardiogram [ECG] [EKG]: Secondary | ICD-10-CM

## 2018-03-03 DIAGNOSIS — M25551 Pain in right hip: Secondary | ICD-10-CM | POA: Diagnosis not present

## 2018-03-03 DIAGNOSIS — Z6841 Body Mass Index (BMI) 40.0 and over, adult: Secondary | ICD-10-CM

## 2018-03-03 DIAGNOSIS — G8929 Other chronic pain: Secondary | ICD-10-CM

## 2018-03-03 DIAGNOSIS — M5116 Intervertebral disc disorders with radiculopathy, lumbar region: Secondary | ICD-10-CM | POA: Diagnosis not present

## 2018-03-03 DIAGNOSIS — M25561 Pain in right knee: Secondary | ICD-10-CM

## 2018-03-03 DIAGNOSIS — M25562 Pain in left knee: Secondary | ICD-10-CM | POA: Diagnosis not present

## 2018-03-03 MED ORDER — ATORVASTATIN CALCIUM 10 MG PO TABS: 10.0000 mg | ORAL_TABLET | Freq: Every day | ORAL | 1 refills | Status: DC

## 2018-03-03 MED ORDER — METFORMIN HCL ER 500 MG PO TB24: 1000.0000 mg | ORAL_TABLET | Freq: Every day | ORAL | 5 refills | Status: DC

## 2018-03-03 NOTE — Assessment & Plan Note (Signed)
Suggested nutrition referral and counseling

## 2018-03-03 NOTE — Assessment & Plan Note (Signed)
Refer to ortho.

## 2018-03-03 NOTE — Assessment & Plan Note (Signed)
Left >> right; refer to ortho

## 2018-03-03 NOTE — Patient Instructions (Addendum)
We'll have you see the cardiologist and the orthopaedist and the nutritionist Please do schedule an appointment with the counselor  Check out the information at familydoctor.org entitled "Nutrition for Weight Loss: What You Need to Know about Fad Diets" Try to lose between 1-2 pounds per week by taking in fewer calories and burning off more calories You can succeed by limiting portions, limiting foods dense in calories and fat, becoming more active, and drinking 8 glasses of water a day (64 ounces) Don't skip meals, especially breakfast, as skipping meals may alter your metabolism Do not use over-the-counter weight loss pills or gimmicks that claim rapid weight loss A healthy BMI (or body mass index) is between 18.5 and 24.9 You can calculate your ideal BMI at the [place] website ClubMonetize.fr   Preventing Unhealthy Weight Gain, Adult Staying at a healthy weight is important. When fat builds up in your body, you may become overweight or obese. These conditions put you at greater risk for developing certain health problems, such as heart disease, diabetes, sleeping problems, joint problems, and some cancers. Unhealthy weight gain is often the result of making unhealthy choices in what you eat. It is also a result of not getting enough exercise. You can make changes to your lifestyle to prevent obesity and stay as healthy as possible. What nutrition changes can be made? To maintain a healthy weight and prevent obesity:  Eat only as much as your body needs. To do this: ? Pay attention to signs that you are hungry or full. Stop eating as soon as you feel full. ? If you feel hungry, try drinking water first. Drink enough water so your urine is clear or pale yellow. ? Eat smaller portions. ? Look at serving sizes on food labels. Most foods contain more than one serving per container. ? Eat the recommended amount of calories for your gender and  activity level. While most active people should eat around 2,000 calories per day, if you are trying to lose weight or are not very active, you main need to eat less calories. Talk to your health care provider or dietitian about how many calories you should eat each day.  Choose healthy foods, such as: ? Fruits and vegetables. Try to fill at least half of your plate at each meal with fruits and vegetables. ? Whole grains, such as whole wheat bread, brown rice, and quinoa. ? Lean meats, such as chicken or fish. ? Other healthy proteins, such as beans, eggs, or tofu. ? Healthy fats, such as nuts, seeds, fatty fish, and olive oil. ? Low-fat or fat-free dairy.  Check food labels and avoid food and drinks that: ? Are high in calories. ? Have added sugar. ? Are high in sodium. ? Have saturated fats or trans fats.  Limit how much you eat of the following foods: ? Prepackaged meals. ? Fast food. ? Fried foods. ? Processed meat, such as bacon, sausage, and deli meats. ? Fatty cuts of red meat and poultry with skin.  Cook foods in healthier ways, such as by baking, broiling, or grilling.  When grocery shopping, try to shop around the outside of the store. This helps you buy mostly fresh foods and avoid canned and prepackaged foods.  What lifestyle changes can be made?  Exercise at least 30 minutes 5 or more days each week. Exercising includes brisk walking, yard work, biking, running, swimming, and team sports like basketball and soccer. Ask your health care provider which exercises are safe for you.  Do not use any products that contain nicotine or tobacco, such as cigarettes and e-cigarettes. If you need help quitting, ask your health care provider.  Limit alcohol intake to no more than 1 drink a day for nonpregnant women and 2 drinks a day for men. One drink equals 12 oz of beer, 5 oz of wine, or 1 oz of hard liquor.  Try to get 7-9 hours of sleep each night. What other changes can be  made?  Keep a food and activity journal to keep track of: ? What you ate and how many calories you had. Remember to count sauces, dressings, and side dishes. ? Whether you were active, and what exercises you did. ? Your calorie, weight, and activity goals.  Check your weight regularly. Track any changes. If you notice you have gained weight, make changes to your diet or activity routine.  Avoid taking weight-loss medicines or supplements. Talk to your health care provider before starting any new medicine or supplement.  Talk to your health care provider before trying any new diet or exercise plan. Why are these changes important? Eating healthy, staying active, and having healthy habits not only help prevent obesity, they also:  Help you to manage stress and emotions.  Help you to connect with friends and family.  Improve your self-esteem.  Improve your sleep.  Prevent long-term health problems.  What can happen if changes are not made? Being obese or overweight can cause you to develop joint or bone problems, which can make it hard for you to stay active or do activities you enjoy. Being obese or overweight also puts stress on your heart and lungs and can lead to health problems like diabetes, heart disease, and some cancers. Where to find more information: Talk with your health care provider or a dietitian about healthy eating and healthy lifestyle choices. You may also find other information through these resources:  U.S. Department of Agriculture MyPlate: FormerBoss.no  American Heart Association: www.heart.org  Centers for Disease Control and Prevention: http://www.wolf.info/  Summary  Staying at a healthy weight is important. It helps prevent certain diseases and health problems, such as heart disease, diabetes, joint problems, sleep disorders, and some cancers.  Being obese or overweight can cause you to develop joint or bone problems, which can make it hard for you to  stay active or do activities you enjoy.  You can prevent unhealthy weight gain by eating a healthy diet, exercising regularly, not smoking, limiting alcohol, and getting enough sleep.  Talk with your health care provider or a dietitian for guidance about healthy eating and healthy lifestyle choices. This information is not intended to replace advice given to you by your health care provider. Make sure you discuss any questions you have with your health care provider. Document Released: 08/11/2016 Document Revised: 09/16/2016 Document Reviewed: 09/16/2016 Elsevier Interactive Patient Education  [employer].

## 2018-03-03 NOTE — Progress Notes (Signed)
BP 136/86   Pulse 98   Temp 98.3 F (36.8 C) (Oral)   Ht 5\' 7"  (1.702 m)   Wt (!) 357 lb 6.4 oz (162.1 kg)   LMP 09/07/2015 (LMP Unknown) Comment: oophorectomy, no period since  SpO2 95%   BMI 55.98 kg/m    Subjective:    Patient ID: Krista Mcgee, female    DOB: 02/23/1976, 42 y.o.   MRN: [id number]  HPI: Krista Mcgee is a 42 y.o. female  Chief Complaint  Patient presents with  . Follow-up    HPI She is here for long-term knee and back pain; terrible pain in left knee; both knees but left >> right; more than a few years ago, was diagnosed with bulging discs; right hip "grinds" when walking; to the point to where she cannot stand long enough to cook dinner; back does not cooperate with standing to do dishes; the weight does not help she says; she has a catch 22, the weight does not help and the pain makes it difficult to exercise; no recent ortho visit; back pain is all the way across; if she sits too long, it goes down the right side; grandmother had rheumatoid arthritis  She has anxiety; having a lump in her throat, fluttering feeling in the throat; not feeling anything different in the chest; just normal pressure when she gets anxious; gets anxiety so often; reviewed EKGs and noted fluttering in the chest in Nov 2016; 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, reader agreed; ankle fracture plus UTI at that visit; echocardiogram was ordered in 2018 but not resulted  Fatty liver found on CT chest  Type 2 diabetes mellitus; A1c was 7.2 in July 2018, came down to 6.7 in May; new medicine working well, avoiding sodas, does get yeast infections if too much sugar; no dry mouth or blurred vision; eye exam due and coming up  Depression screen [facility] 2/9 03/03/2018 01/19/2018 03/19/2017 03/09/2017 07/29/2015  Decreased Interest 1 0 0 0 0  Down, Depressed, Hopeless 1 1 1 1 3   PHQ - 2 Score 2 1 1 1 3   Altered sleeping 3 - - - 3  Tired, decreased energy 3 - - - 3  Change in appetite 0 - - -  0  Feeling bad or failure about yourself  1 - - - 1  Trouble concentrating 1 - - - 2  Moving slowly or fidgety/restless 0 - - - 0  Suicidal thoughts 0 - - - 0  PHQ-9 Score 10 - - - 12  Difficult doing work/chores Somewhat difficult - - - -  MD note: patient sees her psychiatrist; does not want to change medicine right now; doing okay  Relevant past medical, surgical, family and social history reviewed Past Medical History:  Diagnosis Date  . Anxiety   . Depression   . Diabetes mellitus without complication ([place])   . Hypertension    Past Surgical History:  Procedure Laterality Date  . CHOLECYSTECTOMY    . OOPHORECTOMY     Family History  Problem Relation Age of Onset  . Cancer Mother        ovarian  . Other Father        tumor in his ear drum  . Diabetes Father   . Heart disease Father   . Hyperlipidemia Father   . Hypertension Father   . Kidney disease Daughter        hydronephrosis  . Arthritis Paternal Grandmother   .  COPD Paternal Grandmother   . Emphysema Paternal Grandmother   . Heart disease Paternal Grandmother   . Cancer Paternal Grandfather        skin  . COPD Paternal Grandfather   . Emphysema Paternal Grandfather    Social History   Tobacco Use  . Smoking status: Former Research scientist (life sciences)  . Smokeless tobacco: Never Used  . Tobacco comment: has not smoked in about 73yrs.  Substance Use Topics  . Alcohol use: Yes    Alcohol/week: 0.0 oz    Comment: occasionally  . Drug use: No    Interim medical history since last visit reviewed. Allergies and medications reviewed  Review of Systems Per HPI unless specifically indicated above     Objective:    BP 136/86   Pulse 98   Temp 98.3 F (36.8 C) (Oral)   Ht 5\' 7"  (1.702 m)   Wt (!) 357 lb 6.4 oz (162.1 kg)   LMP 09/07/2015 (LMP Unknown) Comment: oophorectomy, no period since  SpO2 95%   BMI 55.98 kg/m   Wt Readings from Last 3 Encounters:  03/03/18 (!) 357 lb 6.4 oz (162.1 kg)  01/19/18 (!) 360 lb  11.2 oz (163.6 kg)  03/19/17 (!) 351 lb 4.8 oz (159.3 kg)    Physical Exam  Constitutional: She appears well-developed and well-nourished. No distress.  HENT:  Head: Normocephalic and atraumatic.  Eyes: EOM are normal. No scleral icterus.  Neck: No thyromegaly present.  Cardiovascular: Normal rate, regular rhythm and normal heart sounds.  No extrasystoles are present.  No murmur heard. Pulmonary/Chest: Effort normal and breath sounds normal. No respiratory distress. She has no wheezes.  Abdominal: Soft. Bowel sounds are normal. She exhibits no distension.  Musculoskeletal: She exhibits no edema.       Right knee: Tenderness found.       Left knee: Tenderness found.       Lumbar back: She exhibits decreased range of motion and tenderness.       Right hand: She exhibits no deformity.       Left hand: She exhibits no deformity.  Lymphadenopathy:    She has no cervical adenopathy.  Neurological: She is alert. She exhibits normal muscle tone.  Skin: Skin is warm and dry. She is not diaphoretic. No pallor.  Psychiatric: She has a normal mood and affect. Her behavior is normal. Judgment and thought content normal.    Results for orders placed or performed in visit on 01/19/18  Hemoglobin A1C  Result Value Ref Range   Hgb A1c MFr Bld 6.7 (H) <5.7 % of total Hgb   Mean Plasma Glucose 146 (calc)   eAG (mmol/L) 8.1 (calc)  Urine Microalbumin w/creat. ratio  Result Value Ref Range   Creatinine, Urine 112 20 - 275 mg/dL   Microalb, Ur 0.8 mg/dL   Microalb Creat Ratio 7 <30 mcg/mg creat  VITAMIN D 25 Hydroxy (Vit-D Deficiency, Fractures)  Result Value Ref Range   Vit D, 25-Hydroxy 23 (L) 30 - 100 ng/mL  TSH  Result Value Ref Range   TSH 2.22 mIU/L  COMPLETE METABOLIC PANEL WITH GFR  Result Value Ref Range   Glucose, Bld 140 (H) 65 - 99 mg/dL   BUN 15 7 - 25 mg/dL   Creat 0.78 0.50 - 1.10 mg/dL   GFR, Est Non African American 96 > OR = 60 mL/min/1.9m2   GFR, Est African American  111 > OR = 60 mL/min/1.71m2   BUN/Creatinine Ratio NOT APPLICABLE 6 - 22 (calc)  Sodium 138 135 - 146 mmol/L   Potassium 4.4 3.5 - 5.3 mmol/L   Chloride 103 98 - 110 mmol/L   CO2 29 20 - 32 mmol/L   Calcium 8.6 8.6 - 10.2 mg/dL   Total Protein 6.9 6.1 - 8.1 g/dL   Albumin 3.8 3.6 - 5.1 g/dL   Globulin 3.1 1.9 - 3.7 g/dL (calc)   AG Ratio 1.2 1.0 - 2.5 (calc)   Total Bilirubin 0.5 0.2 - 1.2 mg/dL   Alkaline phosphatase (APISO) 78 33 - 115 U/L   AST 14 10 - 30 U/L   ALT 14 6 - 29 U/L  CBC with Differential/Platelet  Result Value Ref Range   WBC 11.9 (H) 3.8 - 10.8 Thousand/uL   RBC 4.66 3.80 - 5.10 Million/uL   Hemoglobin 12.5 11.7 - 15.5 g/dL   HCT 37.9 35.0 - 45.0 %   MCV 81.3 80.0 - 100.0 fL   MCH 26.8 (L) 27.0 - 33.0 pg   MCHC 33.0 32.0 - 36.0 g/dL   RDW 14.5 11.0 - 15.0 %   Platelets 316 140 - 400 Thousand/uL   MPV 9.1 7.5 - 12.5 fL   Neutro Abs 7,604 1,500 - 7,800 cells/uL   Lymphs Abs 3,618 850 - 3,900 cells/uL   WBC mixed population 357 200 - 950 cells/uL   Eosinophils Absolute 274 15 - 500 cells/uL   Basophils Absolute 48 0 - 200 cells/uL   Neutrophils Relative % 63.9 %   Total Lymphocyte 30.4 %   Monocytes Relative 3.0 %   Eosinophils Relative 2.3 %   Basophils Relative 0.4 %  Lipid panel  Result Value Ref Range   Cholesterol 192 <200 mg/dL   HDL 37 (L) >50 mg/dL   Triglycerides 253 (H) <150 mg/dL   LDL Cholesterol (Calc) 119 (H) mg/dL (calc)   Total CHOL/HDL Ratio 5.2 (H) <5.0 (calc)   Non-HDL Cholesterol (Calc) 155 (H) <130 mg/dL (calc)   Diabetic Foot Form - Detailed   Diabetic Foot Exam - detailed Diabetic Foot exam was performed with the following findings:  Yes 03/03/2018 10:30 AM  Visual Foot Exam completed.:  Yes  Pulse Foot Exam completed.:  Yes  Right Dorsalis Pedis:  Present Left Dorsalis Pedis:  Present  Sensory Foot Exam Completed.:  Yes Semmes-Weinstein Monofilament Test R Site 1-Great Toe:  Pos L Site 1-Great Toe:  Pos             Assessment & Plan:   Problem List Items Addressed This Visit      Endocrine   Type 2 diabetes mellitus (HCC) (Chronic)   Relevant Medications   atorvastatin (LIPITOR) 10 MG tablet   metFORMIN (GLUCOPHAGE-XR) 500 MG 24 hr tablet   Other Relevant Orders   Amb ref to Medical Nutrition Therapy-MNT     Nervous and Auditory   Lumbar disc disease with radiculopathy - Primary    Agree that the weight and difficulty exercising contributes; refer to ortho      Relevant Orders   Ambulatory referral to Orthopedic Surgery     Other   Morbid obesity with BMI of 50.0-59.9, adult ([place])    Suggested nutrition referral and counseling      Relevant Medications   metFORMIN (GLUCOPHAGE-XR) 500 MG 24 hr tablet   Other Relevant Orders   Amb ref to Medical Nutrition Therapy-MNT   Knee pain, bilateral    Left >> right; refer to ortho      Relevant Orders   Ambulatory referral to Orthopedic  Surgery   Hip pain, chronic, right    Refer to ortho      Relevant Orders   Ambulatory referral to Orthopedic Surgery    Other Visit Diagnoses    Abnormal EKG       Relevant Orders   Ambulatory referral to Cardiology   Fluttering heart       Relevant Orders   Ambulatory referral to Cardiology   Screening for breast cancer       Relevant Orders   MM Digital Screening       Follow up plan: Return in about 2 months (around 05/02/2018) for twenty minute follow-up with fasting labs.  An after-visit summary was printed and given to the patient at [facility].  Please see the patient instructions which may contain other information and recommendations beyond what is mentioned above in the assessment and plan.  Meds ordered this encounter  Medications  . atorvastatin (LIPITOR) 10 MG tablet    Sig: Take 1 tablet (10 mg total) by mouth at bedtime.    Dispense:  30 tablet    Refill:  1  . metFORMIN (GLUCOPHAGE-XR) 500 MG 24 hr tablet    Sig: Take 2 tablets (1,000 mg total) by mouth daily.    Dispense:   60 tablet    Refill:  5    Orders Placed This Encounter  Procedures  . MM Digital Screening  . Amb ref to Medical Nutrition Therapy-MNT  . Ambulatory referral to Cardiology  . Ambulatory referral to Orthopedic Surgery

## 2018-03-03 NOTE — Telephone Encounter (Signed)
Pt.notified

## 2018-03-03 NOTE — Assessment & Plan Note (Signed)
Agree that the weight and difficulty exercising contributes; refer to ortho

## 2018-03-03 NOTE — Telephone Encounter (Signed)
Please let pt know that I would like her to start a cholesterol medicine Her last cholesterol was too high, so in addition to healthy eating, please have her start atorvastatin Thank you

## 2018-03-08 ENCOUNTER — Ambulatory Visit: Payer: Medicaid Other | Attending: Pulmonary Disease

## 2018-03-08 DIAGNOSIS — R0609 Other forms of dyspnea: Secondary | ICD-10-CM | POA: Diagnosis present

## 2018-03-08 MED ORDER — ALBUTEROL SULFATE (2.5 MG/3ML) 0.083% IN NEBU
2.5000 mg | INHALATION_SOLUTION | Freq: Once | RESPIRATORY_TRACT | Status: AC
  Administered 2018-03-08: 2.5 mg via RESPIRATORY_TRACT
  Filled 2018-03-08: qty 3

## 2018-03-15 NOTE — Telephone Encounter (Signed)
Patient calling on status of PFT results Please call to discuss

## 2018-03-16 ENCOUNTER — Telehealth: Payer: Self-pay | Admitting: Obstetrics and Gynecology

## 2018-03-16 NOTE — Telephone Encounter (Signed)
[facility] referring for Hirsutism and wellness exam. Called and left voicemail for patient to call back to be schedule. Patient prefers female provider

## 2018-03-18 ENCOUNTER — Telehealth: Payer: Self-pay | Admitting: Pulmonary Disease

## 2018-03-18 NOTE — Telephone Encounter (Signed)
Patient returning our call for PFT results

## 2018-03-18 NOTE — Telephone Encounter (Signed)
Called and left voice mail for patient to call back to be schedule °

## 2018-03-18 NOTE — Telephone Encounter (Signed)
LMTCB

## 2018-03-18 NOTE — Telephone Encounter (Signed)
Let her know PFTs look good. We will discuss further when she follows up  Waunita Schooner

## 2018-03-18 NOTE — Telephone Encounter (Signed)
Patient returning call.

## 2018-03-18 NOTE — Telephone Encounter (Signed)
Left detailed message per DS. PFT looks good, will discuss in detail at next office visit.

## 2018-03-18 NOTE — Telephone Encounter (Signed)
Pt aware of results of PFT. She has scheduled f/u 8/20. Nothing further needed.

## 2018-03-22 NOTE — Telephone Encounter (Signed)
Called and left voice mail for patient to call back to be schedule °

## 2018-03-25 ENCOUNTER — Other Ambulatory Visit: Payer: Self-pay

## 2018-03-25 DIAGNOSIS — Z1239 Encounter for other screening for malignant neoplasm of breast: Secondary | ICD-10-CM

## 2018-04-12 ENCOUNTER — Encounter: Payer: Self-pay | Admitting: Pulmonary Disease

## 2018-04-12 ENCOUNTER — Ambulatory Visit (INDEPENDENT_AMBULATORY_CARE_PROVIDER_SITE_OTHER): Payer: Medicaid Other | Admitting: Pulmonary Disease

## 2018-04-12 DIAGNOSIS — R0609 Other forms of dyspnea: Secondary | ICD-10-CM

## 2018-04-12 MED ORDER — ALBUTEROL SULFATE HFA 108 (90 BASE) MCG/ACT IN AERS: 1.0000 | INHALATION_SPRAY | RESPIRATORY_TRACT | 10 refills | Status: DC | PRN

## 2018-04-12 NOTE — Progress Notes (Signed)
PULMONARY OFFICE FOLLOW-UP NOTE  Requesting MD/Service: Lada Date of initial consultation: 07/23.18 Reason for consultation: Dyspnea  PT PROFILE: 42 y.o. female former smoker referred for DOE over past 18 mos  DATA: CT chest 09/11/15: No PE. Patchy L sided GGOs and areas of consolidation CT chest 03/10/17: NAD PFTs 03/08/18: FVC: 2.71 L (74 %pred), FEV1: 2.20 L (72 %pred), FEV1/FVC: 81%, TLC: 3.73 L (66 %pred), DLCO 53 %pred, DLCO/VA 141% predicted   INTERVAL: Last seen 03/15/2017.  Missed follow-up appointment.  No major pulmonary events  SUBJ:  This is a scheduled follow-up.  Last visit, we initiated a trial of Symbicort inhaler.  She believes that the inhaler has benefited her slightly but the benefit only lasts for a couple of hours after use.  There is little day-to-day variation in her respiratory symptoms.  She does not have a rescue inhaler.   Last visit, I ordered a split-night sleep study which she never got done.  Further investigation indicates that the sleep center contacted her 3 times and they never received a call back from her.  She continues to have heavy snoring and daytime hypersomnolence.  She denies CP, fever, purulent sputum, hemoptysis, LE edema and calf tenderness.    Vitals:   04/12/18 0932 04/12/18 0936  BP:  (!) 140/98  Pulse:  (!) 125  SpO2:  92%  Weight: (!) 355 lb (161 kg)   Height: 5\' 7"  (1.702 m)   Room air  EXAM:  Gen: Very obese, NAD HEENT: NCAT, sclera white Neck: No JVD Lungs: breath sounds mildly distant, no wheezes or other adventitious sounds Cardiovascular: RRR, no murmurs Abdomen: Soft, nontender, normal BS Ext: without clubbing, cyanosis, edema Neuro: grossly intact Skin: Limited exam, no lesions noted   DATA:   BMP Latest Ref Rng & Units 01/19/2018 03/09/2017 12/22/2016  Glucose 65 - 99 mg/dL 140(H) 142(H) 115(H)  BUN 7 - 25 mg/dL 15 10 12   Creatinine 0.50 - 1.10 mg/dL 0.78 0.88 0.82  BUN/Creat Ratio 6 - 22 (calc) NOT  APPLICABLE - -  Sodium 211 - 146 mmol/L 138 137 136  Potassium 3.5 - 5.3 mmol/L 4.4 4.1 4.1  Chloride 98 - 110 mmol/L 103 102 104  CO2 20 - 32 mmol/L 29 22 25   Calcium 8.6 - 10.2 mg/dL 8.6 9.0 8.9    CBC Latest Ref Rng & Units 01/19/2018 03/09/2017 12/22/2016  WBC 3.8 - 10.8 Thousand/uL 11.9(H) 11.5(H) 13.5(H)  Hemoglobin 11.7 - 15.5 g/dL 12.5 13.0 12.0  Hematocrit 35.0 - 45.0 % 37.9 40.2 37.2  Platelets 140 - 400 Thousand/uL 316 279 297    CXR: No new film  IMPRESSION:     ICD-10-CM   1. Morbid obesity due to excess calories (HCC) E66.01   2. DOE (dyspnea on exertion) R06.09    Her pulmonary function test did not reveal any evidence of COPD.  The restrictive physiology demonstrated is on the basis of obesity.  Obesity is the likely sole contributor to her exertional dyspnea.  She does get some perceived benefit with Symbicort but this benefit only lasts a couple of hours (which does not make sense given its duration of action).  PLAN:  We discussed in detail the importance of weight loss and strategies to achieve this with an emphasis on elimination of simple carbohydrates  Continue Symbicort inhaler as previously prescribed.  I instructed that she may try on and off this medication to determine whether it is truly providing any benefit.  New medication: Albuterol inhaler 2  sprays every 4-6 hours as needed for increased shortness of breath, chest tightness, cough, wheezing  Sleep study has been re-ordered  We will contact her after results of the sleep study for further management and to schedule follow-up   Merton Border, MD PCCM service Mobile [phone] Pager [phone] 04/13/2018 2:49 PM

## 2018-04-12 NOTE — Patient Instructions (Signed)
We discussed the importance of weight loss and strategies in detail  Continue Symbicort inhaler as previously prescribed  New medication: Albuterol inhaler 2 sprays every 4-6 hours as needed for increased shortness of breath, chest tightness, cough, wheezing  Sleep study has been ordered  We will contact you after results of the sleep study for further management and to schedule follow-up

## 2018-04-13 ENCOUNTER — Encounter: Payer: Self-pay | Admitting: Obstetrics and Gynecology

## 2018-04-13 ENCOUNTER — Encounter: Payer: Self-pay | Admitting: Pulmonary Disease

## 2018-04-17 ENCOUNTER — Emergency Department: Payer: Medicaid Other

## 2018-04-17 ENCOUNTER — Telehealth: Payer: Self-pay | Admitting: Family Medicine

## 2018-04-17 ENCOUNTER — Emergency Department
Admission: EM | Admit: 2018-04-17 | Discharge: 2018-04-17 | Disposition: A | Discharge status: Home / Self Care | Payer: Medicaid Other | Attending: Emergency Medicine | Admitting: Emergency Medicine

## 2018-04-17 ENCOUNTER — Other Ambulatory Visit: Payer: Self-pay

## 2018-04-17 ENCOUNTER — Encounter: Payer: Self-pay | Admitting: Emergency Medicine

## 2018-04-17 DIAGNOSIS — E119 Type 2 diabetes mellitus without complications: Secondary | ICD-10-CM | POA: Diagnosis not present

## 2018-04-17 DIAGNOSIS — R319 Hematuria, unspecified: Secondary | ICD-10-CM | POA: Insufficient documentation

## 2018-04-17 DIAGNOSIS — N200 Calculus of kidney: Secondary | ICD-10-CM | POA: Diagnosis not present

## 2018-04-17 DIAGNOSIS — R109 Unspecified abdominal pain: Secondary | ICD-10-CM | POA: Diagnosis present

## 2018-04-17 DIAGNOSIS — Z87891 Personal history of nicotine dependence: Secondary | ICD-10-CM | POA: Diagnosis not present

## 2018-04-17 DIAGNOSIS — I10 Essential (primary) hypertension: Secondary | ICD-10-CM | POA: Diagnosis not present

## 2018-04-17 DIAGNOSIS — Z7984 Long term (current) use of oral hypoglycemic drugs: Secondary | ICD-10-CM | POA: Insufficient documentation

## 2018-04-17 DIAGNOSIS — R1110 Vomiting, unspecified: Secondary | ICD-10-CM | POA: Diagnosis not present

## 2018-04-17 LAB — CBC
HEMATOCRIT: 39.7 % (ref 35.0–47.0)
HEMOGLOBIN: 13.3 g/dL (ref 12.0–16.0)
MCH: 27.8 pg (ref 26.0–34.0)
MCHC: 33.6 g/dL (ref 32.0–36.0)
MCV: 82.8 fL (ref 80.0–100.0)
Platelets: 362 10*3/uL (ref 150–440)
RBC: 4.8 MIL/uL (ref 3.80–5.20)
RDW: 15.8 % — ABNORMAL HIGH (ref 11.5–14.5)
WBC: 13.6 10*3/uL — ABNORMAL HIGH (ref 3.6–11.0)

## 2018-04-17 LAB — URINALYSIS, COMPLETE (UACMP) WITH MICROSCOPIC
BILIRUBIN URINE: NEGATIVE
Bacteria, UA: NONE SEEN
Glucose, UA: NEGATIVE mg/dL
KETONES UR: NEGATIVE mg/dL
NITRITE: NEGATIVE
PROTEIN: 30 mg/dL — AB
Specific Gravity, Urine: 1.017 (ref 1.005–1.030)
pH: 5 (ref 5.0–8.0)

## 2018-04-17 LAB — BASIC METABOLIC PANEL
ANION GAP: 7 (ref 5–15)
BUN: 13 mg/dL (ref 6–20)
CALCIUM: 9 mg/dL (ref 8.9–10.3)
CHLORIDE: 104 mmol/L (ref 98–111)
CO2: 26 mmol/L (ref 22–32)
Creatinine, Ser: 0.92 mg/dL (ref 0.44–1.00)
GFR calc Af Amer: >60 mL/min (ref >60)
GFR calc non Af Amer: >60 mL/min (ref >60)
GLUCOSE: 148 mg/dL — AB (ref 70–99)
POTASSIUM: 4.1 mmol/L (ref 3.5–5.1)
Sodium: 137 mmol/L (ref 135–145)

## 2018-04-17 LAB — PREGNANCY, URINE: PREG TEST UR: NEGATIVE

## 2018-04-17 MED ORDER — IBUPROFEN 200 MG PO TABS: 600.0000 mg | ORAL_TABLET | Freq: Four times a day (QID) | ORAL | 0 refills | Status: DC | PRN

## 2018-04-17 MED ORDER — OXYCODONE-ACETAMINOPHEN 5-325 MG PO TABS: 1.0000 | ORAL_TABLET | ORAL | 0 refills | Status: DC | PRN

## 2018-04-17 MED ORDER — TAMSULOSIN HCL 0.4 MG PO CAPS: 0.4000 mg | ORAL_CAPSULE | Freq: Every day | ORAL | 0 refills | Status: DC

## 2018-04-17 MED ORDER — SODIUM CHLORIDE 0.9 % IV BOLUS
1000.0000 mL | Freq: Once | INTRAVENOUS | Status: AC
  Administered 2018-04-17: 1000 mL via INTRAVENOUS

## 2018-04-17 MED ORDER — ONDANSETRON HCL 4 MG PO TABS: 4.0000 mg | ORAL_TABLET | Freq: Three times a day (TID) | ORAL | 0 refills | Status: DC | PRN

## 2018-04-17 MED ORDER — ONDANSETRON HCL 4 MG/2ML IJ SOLN
4.0000 mg | Freq: Once | INTRAMUSCULAR | Status: AC
  Administered 2018-04-17: 4 mg via INTRAVENOUS
  Filled 2018-04-17: qty 2

## 2018-04-17 MED ORDER — KETOROLAC TROMETHAMINE 30 MG/ML IJ SOLN
15.0000 mg | Freq: Once | INTRAMUSCULAR | Status: AC
  Administered 2018-04-17: 15 mg via INTRAVENOUS
  Filled 2018-04-17: qty 1

## 2018-04-17 MED ORDER — OXYCODONE-ACETAMINOPHEN 5-325 MG PO TABS
1.0000 | ORAL_TABLET | Freq: Once | ORAL | Status: AC
  Administered 2018-04-17: 1 via ORAL
  Filled 2018-04-17: qty 1

## 2018-04-17 NOTE — ED Notes (Signed)
Pt taken to ct at this time.

## 2018-04-17 NOTE — ED Notes (Signed)
Pt back from x-ray, reports some pain relief at this time, fluids re-started, warm blanket given, cont to monitor

## 2018-04-17 NOTE — Telephone Encounter (Signed)
Patient was in the ER; has this abnormality on her scan: 2. Hypodense 4.3 cm right adnexal mass, new, indeterminate. Possible small left upper uterine fibroid. Transabdominal/transvaginal pelvic ultrasound correlation advised, which can be performed on a short term outpatient basis. ----------------------------------------- Please arrange ER follow-up and order ultrasound; thank you

## 2018-04-17 NOTE — ED Provider Notes (Signed)
[facility] Emergency Department Provider Note  ____________________________________________   I have reviewed the triage vital signs and the nursing notes. Where available I have reviewed prior notes and, if possible and indicated, outside hospital notes.    HISTORY  Chief Complaint Flank Pain    HPI Krista Mcgee is a 42 y.o. female  She has a very remote history of kidney stones has not had one in recent memory, presents today with flank pain and vomiting and hematuria.  Sudden onset.  No other complaints.  Sharp.  Radiates towards her groin.  Significant.  No other associated symptoms no prior treatment, has been going on since he woke up this morning.      Past Medical History:  Diagnosis Date  . Anxiety   . Depression   . Diabetes mellitus without complication ([place])   . Hypertension     Patient Active Problem List   Diagnosis Date Noted  . Lumbar disc disease with radiculopathy 03/03/2018  . Knee pain, bilateral 03/03/2018  . Hip pain, chronic, right 03/03/2018  . Low HDL (under 40) 03/29/2017  . High triglycerides 03/29/2017  . Type 2 diabetes mellitus ([place]) 03/19/2017  . Hirsutism 03/19/2017  . Pap smear for cervical cancer screening 03/19/2017  . Vitamin D deficiency 03/10/2017  . Shortness of breath 03/09/2017  . Medication monitoring encounter 03/09/2017  . Morbid obesity with BMI of 50.0-59.9, adult ([place]) 07/29/2015  . Insomnia 07/29/2015  . Major depressive disorder, recurrent, in full remission with anxious distress ([place]) 07/29/2015  . Snoring 07/29/2015  . Encounter for cholesteral screening for cardiovascular disease 07/29/2015  . Other fatigue 07/29/2015    Past Surgical History:  Procedure Laterality Date  . CHOLECYSTECTOMY    . OOPHORECTOMY      Prior to Admission medications   Medication Sig Start Date End Date Taking? Authorizing Provider  ABILIFY 5 MG tablet TAKE 1 AND 1/2 TABLETS BY MOUTH AT BEDTIME 05/26/15    [provider]  albuterol (PROVENTIL HFA;VENTOLIN HFA) 108 (90 Base) MCG/ACT inhaler Inhale 1-2 puffs into the lungs every 4 (four) hours as needed for wheezing or shortness of breath. 04/12/18   Wilhelmina Mcardle, MD  ALPRAZolam Duanne Moron) 1 MG tablet TAKE 1 TABLET BY MOUTH 3 TIMES A DAY AS NEEDED FOR ANXIETY 06/26/15   [provider]  atorvastatin (LIPITOR) 10 MG tablet Take 1 tablet (10 mg total) by mouth at bedtime. 03/03/18   Arnetha Courser, MD  budesonide-formoterol (SYMBICORT) 160-4.5 MCG/ACT inhaler Inhale 2 puffs into the lungs 2 (two) times daily. 01/19/18   Arnetha Courser, MD  buPROPion (WELLBUTRIN XL) 150 MG 24 hr tablet Take 150 mg by mouth daily.    [provider]  empagliflozin (JARDIANCE) 10 MG TABS tablet Take 10 mg by mouth daily. 01/19/18   Arnetha Courser, MD  metFORMIN (GLUCOPHAGE-XR) 500 MG 24 hr tablet Take 2 tablets (1,000 mg total) by mouth daily. 03/03/18   Lada, Satira Anis, MD  SAPHRIS 10 MG SUBL Take 10 mg by mouth at bedtime. 02/21/17   [provider]  venlafaxine XR (EFFEXOR-XR) 75 MG 24 hr capsule Take 225 mg by mouth daily. 06/18/15   [provider]    Allergies Patient has no known allergies.  Family History  Problem Relation Age of Onset  . Cancer Mother        ovarian  . Other Father        tumor in his ear drum  . Diabetes  Father   . Heart disease Father   . Hyperlipidemia Father   . Hypertension Father   . Kidney disease Daughter        hydronephrosis  . Arthritis Paternal Grandmother   . COPD Paternal Grandmother   . Emphysema Paternal Grandmother   . Heart disease Paternal Grandmother   . Cancer Paternal Grandfather        skin  . COPD Paternal Grandfather   . Emphysema Paternal Grandfather     Social History Social History   Tobacco Use  . Smoking status: Former Research scientist (life sciences)  . Smokeless tobacco: Never Used  . Tobacco comment: has not smoked in about 31yrs.  Substance Use Topics  . Alcohol use: Yes     Alcohol/week: 0.0 standard drinks    Comment: occasionally  . Drug use: No    Review of SystemsConstitutional: No fever/chills Eyes: No visual changes. ENT: No sore throat. No stiff neck no neck pain Cardiovascular: Denies chest pain. Respiratory: Denies shortness of breath. Gastrointestinal:   + vomiting.  No diarrhea.  No constipation. Genitourinary: Negative for dysuria. Musculoskeletal: Negative lower extremity swelling Skin: Negative for rash. Neurological: Negative for severe headaches, focal weakness or numbness.   ____________________________________________   PHYSICAL EXAM:  VITAL SIGNS: ED Triage Vitals  Enc Vitals Group     BP 04/17/18 1050 (!) 144/79     Pulse Rate 04/17/18 1050 95     Resp 04/17/18 1050 20     Temp 04/17/18 1050 98.4 F (36.9 C)     Temp src --      SpO2 04/17/18 1050 96 %     Weight 04/17/18 1051 (!) 355 lb (161 kg)     Height 04/17/18 1051 5\' 6"  (1.676 m)     Head Circumference --      Peak Flow --      Pain Score 04/17/18 1051 9     Pain Loc --      Pain Edu? --      Excl. in [place]? --     Constitutional: Alert and oriented. Well appearing and in no acute distress. Eyes: Conjunctivae are normal Head: Atraumatic HEENT: No congestion/rhinnorhea. Mucous membranes are moist.  Oropharynx non-erythematous Neck:   Nontender with no meningismus, no masses, no stridor Cardiovascular: Normal rate, regular rhythm. Grossly normal heart sounds.  Good peripheral circulation. Respiratory: Normal respiratory effort.  No retractions. Lungs CTAB. Abdominal: Soft and nontender. No distention. No guarding no rebound Back:  There is no focal tenderness or step off.  there is no midline tenderness there are no lesions noted. there is + CVA tenderness Musculoskeletal: No lower extremity tenderness, no upper extremity tenderness. No joint effusions, no DVT signs strong distal pulses no edema Neurologic:  Normal speech and language. No gross focal  neurologic deficits are appreciated.  Skin:  Skin is warm, dry and intact. No rash noted. Psychiatric: Mood and affect are normal. Speech and behavior are normal.  ____________________________________________   LABS (all labs ordered are listed, but only abnormal results are displayed)  Labs Reviewed  URINALYSIS, COMPLETE (UACMP) WITH MICROSCOPIC - Abnormal; Notable for the following components:      Result Value   Color, Urine YELLOW (*)    APPearance CLOUDY (*)    Hgb urine dipstick LARGE (*)    Protein, ur 30 (*)    Leukocytes, UA TRACE (*)    RBC / HPF >50 (*)    All other components within normal limits  BASIC METABOLIC PANEL -  Abnormal; Notable for the following components:   Glucose, Bld 148 (*)    All other components within normal limits  CBC - Abnormal; Notable for the following components:   WBC 13.6 (*)    RDW 15.8 (*)    All other components within normal limits  PREGNANCY, URINE  POC URINE PREG, ED    Pertinent labs  results that were available during my care of the patient were reviewed by me and considered in my medical decision making (see chart for details). ____________________________________________  EKG  I personally interpreted any EKGs ordered by me or triage  ____________________________________________  RADIOLOGY  Pertinent labs & imaging results that were available during my care of the patient were reviewed by me and considered in my medical decision making (see chart for details). If possible, patient and/or family made aware of any abnormal findings.  Ct Renal Stone Study  Result Date: 04/17/2018 CLINICAL DATA:  Right flank pain since this morning. EXAM: CT ABDOMEN AND PELVIS WITHOUT CONTRAST TECHNIQUE: Multidetector CT imaging of the abdomen and pelvis was performed following the standard protocol without IV contrast. COMPARISON:  03/07/2010 CT abdomen/pelvis. FINDINGS: Lower chest: Tiny calcified right lower lobe granuloma is stable.  Minimal scarring versus atelectasis at the anterior left lung base. Hepatobiliary: Diffuse hepatic steatosis. Cholecystectomy. No biliary ductal dilatation. Pancreas: Normal, with no mass or duct dilation. Spleen: Normal size. No mass. Adrenals/Urinary Tract: Normal adrenals. Obstructing 3 mm distal right pelvic ureteral stone with mild right hydroureteronephrosis. No stones in the kidneys. No left hydronephrosis. No contour deforming renal masses. Normal caliber left ureter. No additional ureteral stones. Normal bladder. Stomach/Bowel: Normal non-distended stomach. Normal caliber small bowel with no small bowel wall thickening. Normal appendix. Moderate sigmoid diverticulosis, with no large bowel wall thickening or significant pericolonic fat stranding. Vascular/Lymphatic: Normal caliber abdominal aorta. No pathologically enlarged lymph nodes in the abdomen or pelvis. Reproductive: Hypodense 4.3 cm right adnexal mass with density 25 HU (series 2/image 62), new. No left adnexal mass. Anteverted uterus with hypodense 1.6 cm anterior left upper uterine body myometrial focus suggestive of a small fibroid. Other: No pneumoperitoneum, ascites or focal fluid collection. Musculoskeletal: No aggressive appearing focal osseous lesions. IMPRESSION: 1. Obstructing 3 mm distal right pelvic ureteral stone with mild right hydroureteronephrosis. 2. Hypodense 4.3 cm right adnexal mass, new, indeterminate. Possible small left upper uterine fibroid. Transabdominal/transvaginal pelvic ultrasound correlation advised, which can be performed on a short term outpatient basis. 3. Moderate sigmoid diverticulosis. 4. Diffuse hepatic steatosis. Electronically Signed   By: Ilona Sorrel M.D.   On: 04/17/2018 13:15   ____________________________________________    PROCEDURES  Procedure(s) performed: None  Procedures  Critical Care performed: None  ____________________________________________   INITIAL IMPRESSION / ASSESSMENT AND  PLAN / ED COURSE  Pertinent labs & imaging results that were available during my care of the patient were reviewed by me and considered in my medical decision making (see chart for details).  Patient here with positive flank pain morbid obesity limits exam for this reason I did do a CT scan CT scan does remark suspicion of acute kidney stone is 3 mm likely the past.  Incidentally noted is also a small mass near the uterus which is unrelated I think to this complaint.  We will have her follow closely as an outpatient for repeat ultrasound.  This is not something that needs to happen today.  We did give her Toradol she is pain-free vital signs are reassuring we will try to get her  home with outpatient follow-up and return precautions given and understood    ____________________________________________   FINAL CLINICAL IMPRESSION(S) / ED DIAGNOSES  Final diagnoses:  None      This chart was dictated using voice recognition software.  Despite best efforts to proofread,  errors can occur which can change meaning.      Schuyler Amor, MD 04/17/18 1330

## 2018-04-17 NOTE — Discharge Instructions (Signed)
You have a small kidney stone it is 3 mm that should pass, if you have severe pain that is not controlled by medication we gave you, vomiting, or you feel worse in any way return to the emergency room.  Fever is something to especially look out for.  In addition, we did notice a unusual finding on the anterior uterus, this will require an outpatient ultrasound.  Please tell your OB/GYN at [facility] about that.  That should happen in the next week or 2.

## 2018-04-17 NOTE — ED Triage Notes (Signed)
Pt presents with right flank pain since she awakened this morning. She denies urinary symptoms. Pt tearful during triage.

## 2018-04-18 ENCOUNTER — Encounter: Payer: Self-pay | Admitting: Nurse Practitioner

## 2018-04-18 DIAGNOSIS — K5790 Diverticulosis of intestine, part unspecified, without perforation or abscess without bleeding: Secondary | ICD-10-CM | POA: Insufficient documentation

## 2018-04-18 DIAGNOSIS — K760 Fatty (change of) liver, not elsewhere classified: Secondary | ICD-10-CM | POA: Insufficient documentation

## 2018-04-18 HISTORY — DX: Diverticulosis of intestine, part unspecified, without perforation or abscess without bleeding: K57.90

## 2018-04-18 NOTE — Telephone Encounter (Signed)
Spoke with patient and she scheduled an appt with Benjamine Mola for 04-19-18

## 2018-04-19 ENCOUNTER — Ambulatory Visit: Payer: Medicaid Other | Admitting: Nurse Practitioner

## 2018-04-19 ENCOUNTER — Encounter: Payer: Self-pay | Admitting: Nurse Practitioner

## 2018-04-19 ENCOUNTER — Telehealth: Payer: Self-pay | Admitting: Family Medicine

## 2018-04-19 VITALS — BP 110/60 | HR 106 | Temp 98.1°F | Resp 16 | Ht 67.0 in | Wt 359.2 lb

## 2018-04-19 DIAGNOSIS — N200 Calculus of kidney: Secondary | ICD-10-CM

## 2018-04-19 DIAGNOSIS — K5790 Diverticulosis of intestine, part unspecified, without perforation or abscess without bleeding: Secondary | ICD-10-CM

## 2018-04-19 DIAGNOSIS — K760 Fatty (change of) liver, not elsewhere classified: Secondary | ICD-10-CM

## 2018-04-19 DIAGNOSIS — N9489 Other specified conditions associated with female genital organs and menstrual cycle: Secondary | ICD-10-CM

## 2018-04-19 DIAGNOSIS — N949 Unspecified condition associated with female genital organs and menstrual cycle: Secondary | ICD-10-CM | POA: Diagnosis not present

## 2018-04-19 MED ORDER — OXYCODONE-ACETAMINOPHEN 5-325 MG PO TABS: 1.0000 | ORAL_TABLET | ORAL | 0 refills | Status: DC | PRN

## 2018-04-19 NOTE — Telephone Encounter (Signed)
Left detailed voicemail

## 2018-04-19 NOTE — Telephone Encounter (Signed)
Copied from [place] [id number]. Topic: Quick Communication - See Telephone Encounter >> Apr 19, 2018  1:42 PM Nils Flack wrote: CRM for notification. See Telephone encounter for: 04/19/18. Sharyn Lull from [facility] called - she said that the doctor looked at the pt's ct, and that they do not do anything for a stone that is smaller than 5 mm.  The pt's stone is 3 mm.  She also said that her first opening would be the 12th, and the doctor feels that the pt will pass the stone before then.  They are still happy to make an appt for the pt if you want, they just wanted to let you know what the doctor said.

## 2018-04-19 NOTE — Progress Notes (Signed)
Name: Krista Mcgee   MRN: [id number]    DOB: Jan 19, 1976   Date:04/19/2018       Progress Note  Subjective  Chief Complaint  Chief Complaint  Patient presents with  . Hospitalization Follow-up    Follow up from ED for right flank pain due to kidney stone. She continues to have pain 8/10 pain goes from sharp to dull intermittant x 3 days.    HPI  Patient was seen on 04/17/2018 for kidney stone and given rx for tamsulosin, percocet, zofran and ibuprofen. Urine showed large blood, trace leukocytes and protein. CBC showed mildly elevated WBC; BMP unremarkable. CT acutely showed obstructing stone; other findings included right adnexal mass, moderate sigmoid diverticulosis, and hepatic steatosis.  Since ER visit still having significant pain; feels it is a little bit lower now on right side but still hasn't passed. Has been drinking lots of water and lemonade and taking percoccet with ibuprofen in between to help with pain- just makes it manageable- from sharp to achy pain. Drinking at least 80 ounces of fluids a day.   Patient Active Problem List   Diagnosis Date Noted  . Hepatic steatosis 04/18/2018  . Diverticulosis 04/18/2018  . Lumbar disc disease with radiculopathy 03/03/2018  . Knee pain, bilateral 03/03/2018  . Hip pain, chronic, right 03/03/2018  . Low HDL (under 40) 03/29/2017  . High triglycerides 03/29/2017  . Type 2 diabetes mellitus ([place]) 03/19/2017  . Hirsutism 03/19/2017  . Pap smear for cervical cancer screening 03/19/2017  . Vitamin D deficiency 03/10/2017  . Shortness of breath 03/09/2017  . Medication monitoring encounter 03/09/2017  . Morbid obesity with BMI of 50.0-59.9, adult ([place]) 07/29/2015  . Insomnia 07/29/2015  . Major depressive disorder, recurrent, in full remission with anxious distress ([place]) 07/29/2015  . Snoring 07/29/2015  . Encounter for cholesteral screening for cardiovascular disease 07/29/2015  . Other fatigue 07/29/2015    Past Medical  History:  Diagnosis Date  . Anxiety   . Depression   . Diabetes mellitus without complication ([place])   . Hypertension     Past Surgical History:  Procedure Laterality Date  . CHOLECYSTECTOMY    . OOPHORECTOMY      Social History   Tobacco Use  . Smoking status: Former Research scientist (life sciences)  . Smokeless tobacco: Never Used  . Tobacco comment: has not smoked in about 54yr.  Substance Use Topics  . Alcohol use: Yes    Alcohol/week: 0.0 standard drinks    Comment: occasionally     Current Outpatient Medications:  .  ABILIFY 5 MG tablet, TAKE 1 AND 1/2 TABLETS BY MOUTH AT BEDTIME, Disp: , Rfl: 5 .  albuterol (PROVENTIL HFA;VENTOLIN HFA) 108 (90 Base) MCG/ACT inhaler, Inhale 1-2 puffs into the lungs every 4 (four) hours as needed for wheezing or shortness of breath., Disp: 1 Inhaler, Rfl: 10 .  ALPRAZolam (XANAX) 1 MG tablet, TAKE 1 TABLET BY MOUTH 3 TIMES A DAY AS NEEDED FOR ANXIETY, Disp: , Rfl: 2 .  atorvastatin (LIPITOR) 10 MG tablet, Take 1 tablet (10 mg total) by mouth at bedtime., Disp: 30 tablet, Rfl: 1 .  budesonide-formoterol (SYMBICORT) 160-4.5 MCG/ACT inhaler, Inhale 2 puffs into the lungs 2 (two) times daily., Disp: 1 Inhaler, Rfl: 11 .  buPROPion (WELLBUTRIN XL) 150 MG 24 hr tablet, Take 150 mg by mouth daily., Disp: , Rfl:  .  empagliflozin (JARDIANCE) 10 MG TABS tablet, Take 10 mg by mouth daily., Disp: 30 tablet, Rfl: 5 .  ibuprofen (MOTRIN IB)  200 MG tablet, Take 3 tablets (600 mg total) by mouth every 6 (six) hours as needed., Disp: 20 tablet, Rfl: 0 .  metFORMIN (GLUCOPHAGE-XR) 500 MG 24 hr tablet, Take 2 tablets (1,000 mg total) by mouth daily., Disp: 60 tablet, Rfl: 5 .  ondansetron (ZOFRAN) 4 MG tablet, Take 1 tablet (4 mg total) by mouth every 8 (eight) hours as needed for nausea or vomiting., Disp: 8 tablet, Rfl: 0 .  oxyCODONE-acetaminophen (PERCOCET) 5-325 MG tablet, Take 1 tablet by mouth every 4 (four) hours as needed for severe pain., Disp: 8 tablet, Rfl: 0 .  SAPHRIS  10 MG SUBL, Take 10 mg by mouth at bedtime., Disp: , Rfl: 1 .  tamsulosin (FLOMAX) 0.4 MG CAPS capsule, Take 1 capsule (0.4 mg total) by mouth daily., Disp: 10 capsule, Rfl: 0 .  venlafaxine XR (EFFEXOR-XR) 75 MG 24 hr capsule, Take 225 mg by mouth daily., Disp: , Rfl: 5  No Known Allergies  Review of Systems  Constitutional: Negative for chills and fever.  Respiratory: Negative for cough and shortness of breath.   Cardiovascular: Negative for chest pain and palpitations.  Gastrointestinal: Positive for nausea. Negative for abdominal pain, diarrhea and vomiting.  Genitourinary: Positive for flank pain (right ) and frequency. Negative for dysuria and hematuria.      No other specific complaints in a complete review of systems (except as listed in HPI above).  Objective  Vitals:   04/19/18 0854  BP: 110/60  Pulse: (!) 106  Resp: 16  Temp: 98.1 F (36.7 C)  TempSrc: Oral  SpO2: 96%  Weight: (!) 359 lb 3.2 oz (162.9 kg)  Height: '5\' 7"'  (1.702 m)    Body mass index is 56.26 kg/m.  Nursing Note and Vital Signs reviewed.  Physical Exam  Constitutional: She is oriented to person, place, and time. She appears well-developed and well-nourished. She appears distressed (appears mildly uncomfortable in office).  Cardiovascular: Normal rate, regular rhythm and normal heart sounds.  Pulmonary/Chest: Effort normal and breath sounds normal.  Abdominal: Soft. Bowel sounds are normal.  Right flank pain   Neurological: She is alert and oriented to person, place, and time.  Skin: Skin is warm and dry. Capillary refill takes 2 to 3 seconds. She is not diaphoretic.  Psychiatric: She has a normal mood and affect. Her behavior is normal. Judgment and thought content normal.      Results for orders placed or performed during the hospital encounter of 04/17/18 (from the past 48 hour(s))  Urinalysis, Complete w Microscopic     Status: Abnormal   Collection Time: 04/17/18 10:54 AM  Result  Value Ref Range   Color, Urine YELLOW (A) YELLOW   APPearance CLOUDY (A) CLEAR   Specific Gravity, Urine 1.017 1.005 - 1.030   pH 5.0 5.0 - 8.0   Glucose, UA NEGATIVE NEGATIVE mg/dL   Hgb urine dipstick LARGE (A) NEGATIVE   Bilirubin Urine NEGATIVE NEGATIVE   Ketones, ur NEGATIVE NEGATIVE mg/dL   Protein, ur 30 (A) NEGATIVE mg/dL   Nitrite NEGATIVE NEGATIVE   Leukocytes, UA TRACE (A) NEGATIVE   RBC / HPF >50 (H) 0 - 5 RBC/hpf   WBC, UA 6-10 0 - 5 WBC/hpf   Bacteria, UA NONE SEEN NONE SEEN   Squamous Epithelial / LPF 0-5 0 - 5   Mucus PRESENT     Comment: Performed at [facility], [address], [place], [place] [address]  Basic metabolic panel     Status: Abnormal  Collection Time: 04/17/18 10:54 AM  Result Value Ref Range   Sodium 137 135 - 145 mmol/L   Potassium 4.1 3.5 - 5.1 mmol/L   Chloride 104 98 - 111 mmol/L   CO2 26 22 - 32 mmol/L   Glucose, Bld 148 (H) 70 - 99 mg/dL   BUN 13 6 - 20 mg/dL   Creatinine, Ser 0.92 0.44 - 1.00 mg/dL   Calcium 9.0 8.9 - 10.3 mg/dL   GFR calc non Af Amer >60 >60 mL/min   GFR calc Af Amer >60 >60 mL/min    Comment: (NOTE) The eGFR has been calculated using the CKD EPI equation. This calculation has not been validated in all clinical situations. eGFR's persistently <60 mL/min signify possible Chronic Kidney Disease.    Anion gap 7 5 - 15    Comment: Performed at [facility], [address] [place], [place] [address]  CBC     Status: Abnormal   Collection Time: 04/17/18 10:54 AM  Result Value Ref Range   WBC 13.6 (H) 3.6 - 11.0 K/uL   RBC 4.80 3.80 - 5.20 MIL/uL   Hemoglobin 13.3 12.0 - 16.0 g/dL   HCT 39.7 35.0 - 47.0 %   MCV 82.8 80.0 - 100.0 fL   MCH 27.8 26.0 - 34.0 pg   MCHC 33.6 32.0 - 36.0 g/dL   RDW 15.8 (H) 11.5 - 14.5 %   Platelets 362 150 - 440 K/uL    Comment: Performed at [facility], [address] [place], [place] [address]  Pregnancy, urine     Status: None   Collection Time:  04/17/18 10:54 AM  Result Value Ref Range   Preg Test, Ur NEGATIVE NEGATIVE    Comment: Performed at [facility], [address] [place], [place] [address]    Assessment & Plan  1. Kidney stones Continue to push fluids, take flomax, take pain meds as needed. PMP aware checked.  Tachycardia likely due to pain.  - Ambulatory referral to Urology - oxyCODONE-acetaminophen (PERCOCET) 5-325 MG tablet; Take 1 tablet by mouth every 4 (four) hours as needed for severe pain.  Dispense: 8 tablet; Refill: 0  2. Diverticulosis Discussed diet, weight loss  3. Hepatic steatosis Discussed diet, weight loss  4. Adnexal mass - US OB Transvaginal; Future   -Red flags and when to present for emergency care or RTC including fever >101.80F, nausea, severe pain new/worsening/un-resolving symptoms,  reviewed with patient at time of visit. Follow up and care instructions discussed and provided in AVS. -Reviewed Health Maintenance: declines flu shot today. Has appt for pap

## 2018-04-19 NOTE — Patient Instructions (Addendum)
-Please call [phone] to schedule your imaging test. Please wait 2-3 days after the order has been placed to call and get your test scheduled. -Please do call to schedule your mammogram; the number to schedule one at either [facility] or [facility] is [phone]   Diverticulosis Diverticulosis is a condition that develops when small pouches (diverticula) form in the wall of the large intestine (colon). The colon is where water is absorbed and stool is formed. The pouches form when the inside layer of the colon pushes through weak spots in the outer layers of the colon. You may have a few pouches or many of them. What are the causes? The cause of this condition is not known. What increases the risk? The following factors may make you more likely to develop this condition:  Being older than age 17. Your risk for this condition increases with age. Diverticulosis is rare among people younger than age 46. By age 80, many people have it.  Eating a low-fiber diet.  Having frequent constipation.  Being overweight.  Not getting enough exercise.  Smoking.  Taking over-the-counter pain medicines, like aspirin and ibuprofen.  Having a family history of diverticulosis.  What are the signs or symptoms? In most people, there are no symptoms of this condition. If you do have symptoms, they may include:  Bloating.  Cramps in the abdomen.  Constipation or diarrhea.  Pain in the lower left side of the abdomen.  How is this diagnosed? This condition is most often diagnosed during an exam for other colon problems. Because diverticulosis usually has no symptoms, it often cannot be diagnosed independently. This condition may be diagnosed by:  Using a flexible scope to examine the colon (colonoscopy).  Taking an X-ray of the colon after dye has been put into the colon (barium enema).  Doing a CT scan.  How is this treated? You may not need treatment  for this condition if you have never developed an infection related to diverticulosis. If you have had an infection before, treatment may include:  Eating a high-fiber diet. This may include eating more fruits, vegetables, and grains.  Taking a fiber supplement.  Taking a live bacteria supplement (probiotic).  Taking medicine to relax your colon.  Taking antibiotic medicines.  Follow these instructions at home:  Drink 6-8 glasses of water or more each day to prevent constipation.  Try not to strain when you have a bowel movement.  If you have had an infection before: ? Eat more fiber as directed by your health care provider or your diet and nutrition specialist (dietitian). ? Take a fiber supplement or probiotic, if your health care provider approves.  Take over-the-counter and prescription medicines only as told by your health care provider.  If you were prescribed an antibiotic, take it as told by your health care provider. Do not stop taking the antibiotic even if you start to feel better.  Keep all follow-up visits as told by your health care provider. This is important. Contact a health care provider if:  You have pain in your abdomen.  You have bloating.  You have cramps.  You have not had a bowel movement in 3 days. Get help right away if:  Your pain gets worse.  Your bloating becomes very bad.  You have a fever or chills, and your symptoms suddenly get worse.  You vomit.  You have bowel movements that are bloody or black.  You have bleeding from your  rectum. Summary  Diverticulosis is a condition that develops when small pouches (diverticula) form in the wall of the large intestine (colon).  You may have a few pouches or many of them.  This condition is most often diagnosed during an exam for other colon problems.  If you have had an infection related to diverticulosis, treatment may include increasing the fiber in your diet, taking supplements, or  taking medicines. This information is not intended to replace advice given to you by your health care provider. Make sure you discuss any questions you have with your health care provider. Document Released: 05/07/2004 Document Revised: 06/29/2016 Document Reviewed: 06/29/2016 Elsevier Interactive Patient Education  2017 [place] can become very large.   Fatty Liver Fatty liver, also called hepatic steatosis or steatohepatitis, is a condition in which too much fat has built up in your liver cells. The liver removes harmful substances from your bloodstream. It produces fluids your body needs. It also helps your body use and store energy from the food you eat. In many cases, fatty liver does not cause symptoms or problems. It is often diagnosed when tests are being done for other reasons. However, over time, fatty liver can cause inflammation that may lead to more serious liver problems, such as scarring of the liver (cirrhosis). What are the causes? Causes of fatty liver may include:  Drinking too much alcohol.  Poor nutrition.  Obesity.  Cushing syndrome.  Diabetes.  Hyperlipidemia.  Pregnancy.  Certain drugs.  Poisons.  Some viral infections.  What increases the risk? You may be more likely to develop fatty liver if you:  Abuse alcohol.  Are pregnant.  Are overweight.  Have diabetes.  Have hepatitis.  Have a high triglyceride level.  What are the signs or symptoms? Fatty liver often does not cause any symptoms. In cases where symptoms develop, they can include:  Fatigue.  Weakness.  Weight loss.  Confusion.  Abdominal pain.  Yellowing of your skin and the white parts of your eyes (jaundice).  Nausea and vomiting.  How is this diagnosed? Fatty liver may be diagnosed by:  Physical exam and medical history.  Blood tests.  Imaging tests, such as an ultrasound, CT scan, or MRI.  Liver biopsy. A small sample of liver tissue is removed  using a needle. The sample is then looked at under a microscope.  How is this treated? Fatty liver is often caused by other health conditions. Treatment for fatty liver may involve medicines and lifestyle changes to manage conditions such as:  Alcoholism.  High cholesterol.  Diabetes.  Being overweight or obese.  Follow these instructions at home:  Eat a healthy diet as directed by your health care provider.  Exercise regularly. This can help you lose weight and control your cholesterol and diabetes. Talk to your health care provider about an exercise plan and which activities are best for you.  Do not drink alcohol.  Take medicines only as directed by your health care provider. Contact a health care provider if: You have difficulty controlling your:  Blood sugar.  Cholesterol.  Alcohol consumption.  Get help right away if:  You have abdominal pain.  You have jaundice.  You have nausea and vomiting. This information is not intended to replace advice given to you by your health care provider. Make sure you discuss any questions you have with your health care provider. Document Released: 09/25/2005 Document Revised: 01/16/2016 Document Reviewed: 12/20/2013 Elsevier Interactive Patient Education  [employer].

## 2018-04-22 ENCOUNTER — Telehealth: Payer: Self-pay

## 2018-04-22 ENCOUNTER — Telehealth: Payer: Self-pay | Admitting: Family Medicine

## 2018-04-22 DIAGNOSIS — N9489 Other specified conditions associated with female genital organs and menstrual cycle: Secondary | ICD-10-CM

## 2018-04-22 NOTE — Telephone Encounter (Signed)
Copied from [place] [phone]. Topic: Quick Communication - See Telephone Encounter >> Apr 22, 2018  4:24 PM Nils Flack wrote: CRM for notification. See Telephone encounter for: 04/22/18. Pre-service called - they said that the current order is no longer used for the symptom listed for the patient.  They are changing the order to ultrasound pelvis complete with transvaginal.(IMG 4000)  It needs to be signed, though.   Pt has appt scheduled for 04/27/18

## 2018-04-22 NOTE — Telephone Encounter (Signed)
Copied from [place] [phone]. Topic: Quick Communication - See Telephone Encounter >> Apr 21, 2018  2:42 PM Nils Flack wrote: CRM for notification. See Telephone encounter for: 04/21/18.pt was denied for cpt [phone] (CPT)] because she is not pregnant.   I did get an approval for cpt [phone] Please advise.  Pt has appt on 04/27/18

## 2018-04-22 NOTE — Telephone Encounter (Signed)
Order changed to CPT [id number]. Plese reschedule the original order [place] to this one and delete the old order. Unable to discontinue old order as it is scheduled. Thanks

## 2018-04-26 ENCOUNTER — Other Ambulatory Visit: Payer: Self-pay | Admitting: Nurse Practitioner

## 2018-04-26 ENCOUNTER — Telehealth: Payer: Self-pay | Admitting: Family Medicine

## 2018-04-26 DIAGNOSIS — N9489 Other specified conditions associated with female genital organs and menstrual cycle: Secondary | ICD-10-CM

## 2018-04-26 NOTE — Telephone Encounter (Signed)
Copied from [place]. Topic: Quick Communication - See Telephone Encounter >> Apr 26, 2018  9:12 AM Percell Belt A wrote: CRM for notification. See Telephone encounter for: 04/26/18. [facility] called in and state that they need a authorization for a Ultrasound pelvic complete.  She stated that the code [phone] has already been authorization but they need to add the pelvic ultrasound .  She is need this Authorization by 3 today [phone]

## 2018-04-27 ENCOUNTER — Ambulatory Visit: Payer: Medicaid Other

## 2018-04-29 ENCOUNTER — Telehealth: Payer: Self-pay | Admitting: Family Medicine

## 2018-04-29 NOTE — Telephone Encounter (Signed)
Sent to the [facility] referral pool

## 2018-04-29 NOTE — Telephone Encounter (Signed)
Copied from [place] [phone]. Topic: Quick Communication - See Telephone Encounter >> Apr 29, 2018 12:22 PM Gardiner Ramus wrote: CRM for notification. See Telephone encounter for: 04/29/18. [facility] stated that she would like to talk with someone regarding a prior authorization concerning a referral sent in today 04/29/18. Please advise [id number] ex [phone]

## 2018-05-04 ENCOUNTER — Ambulatory Visit: Payer: Medicaid Other | Admitting: Family Medicine

## 2018-05-04 ENCOUNTER — Ambulatory Visit: Admission: RE | Admit: 2018-05-04 | Payer: Medicaid Other | Source: Ambulatory Visit

## 2018-05-11 ENCOUNTER — Ambulatory Visit: Payer: Medicaid Other | Attending: Pulmonary Disease

## 2018-05-11 NOTE — Progress Notes (Deleted)
Cardiology Office Note  Date:  05/11/2018   ID:  Krista Mcgee, DOB Dec 23, 1975, MRN [id number]  PCP:  Arnetha Courser, MD   No chief complaint on file.   HPI:    Kidney stones Morbid obesity causing restrictive pulmonary physiology Chronic shortness of breath secondary to morbid obesity Suspected sleep apnea Referred by Dr. Sanda Klein for consultation of her abnormal EKG , and palpitations     PMH:   has a past medical history of Anxiety, Depression, Diabetes mellitus without complication ([place]), and Hypertension.  PSH:    Past Surgical History:  Procedure Laterality Date  . CHOLECYSTECTOMY    . OOPHORECTOMY      Current Outpatient Medications  Medication Sig Dispense Refill  . ABILIFY 5 MG tablet TAKE 1 AND 1/2 TABLETS BY MOUTH AT BEDTIME  5  . albuterol (PROVENTIL HFA;VENTOLIN HFA) 108 (90 Base) MCG/ACT inhaler Inhale 1-2 puffs into the lungs every 4 (four) hours as needed for wheezing or shortness of breath. 1 Inhaler 10  . ALPRAZolam (XANAX) 1 MG tablet TAKE 1 TABLET BY MOUTH 3 TIMES A DAY AS NEEDED FOR ANXIETY  2  . atorvastatin (LIPITOR) 10 MG tablet Take 1 tablet (10 mg total) by mouth at bedtime. 30 tablet 1  . budesonide-formoterol (SYMBICORT) 160-4.5 MCG/ACT inhaler Inhale 2 puffs into the lungs 2 (two) times daily. 1 Inhaler 11  . buPROPion (WELLBUTRIN XL) 150 MG 24 hr tablet Take 150 mg by mouth daily.    . empagliflozin (JARDIANCE) 10 MG TABS tablet Take 10 mg by mouth daily. 30 tablet 5  . ibuprofen (MOTRIN IB) 200 MG tablet Take 3 tablets (600 mg total) by mouth every 6 (six) hours as needed. 20 tablet 0  . metFORMIN (GLUCOPHAGE-XR) 500 MG 24 hr tablet Take 2 tablets (1,000 mg total) by mouth daily. 60 tablet 5  . ondansetron (ZOFRAN) 4 MG tablet Take 1 tablet (4 mg total) by mouth every 8 (eight) hours as needed for nausea or vomiting. 8 tablet 0  . oxyCODONE-acetaminophen (PERCOCET) 5-325 MG tablet Take 1 tablet by mouth every 4 (four) hours as needed for severe  pain. 8 tablet 0  . SAPHRIS 10 MG SUBL Take 10 mg by mouth at bedtime.  1  . tamsulosin (FLOMAX) 0.4 MG CAPS capsule Take 1 capsule (0.4 mg total) by mouth daily. 10 capsule 0  . venlafaxine XR (EFFEXOR-XR) 75 MG 24 hr capsule Take 225 mg by mouth daily.  5   No current facility-administered medications for this visit.      Allergies:   Patient has no known allergies.   Social History:  The patient  reports that she has quit smoking. She has never used smokeless tobacco. She reports that she drinks alcohol. She reports that she does not use drugs.   Family History:   family history includes Arthritis in her paternal grandmother; COPD in her paternal grandfather and paternal grandmother; Cancer in her mother and paternal grandfather; Diabetes in her father; Emphysema in her paternal grandfather and paternal grandmother; Heart disease in her father and paternal grandmother; Hyperlipidemia in her father; Hypertension in her father; Kidney disease in her daughter; Other in her father.    Review of Systems: ROS   PHYSICAL EXAM: VS:  LMP 09/07/2015 (LMP Unknown) Comment: oophorectomy, no period since , BMI There is no height or weight on file to calculate BMI. GEN: Well nourished, well developed, in no acute distress HEENT: normal Neck: no JVD, carotid bruits, or masses Cardiac: RRR;  no murmurs, rubs, or gallops,no edema  Respiratory:  clear to auscultation bilaterally, normal work of breathing GI: soft, nontender, nondistended, + BS MS: no deformity or atrophy Skin: warm and dry, no rash Neuro:  Strength and sensation are intact Psych: euthymic mood, full affect    Recent Labs: 01/19/2018: ALT 14; TSH 2.22 04/17/2018: BUN 13; Creatinine, Ser 0.92; Hemoglobin 13.3; Platelets 362; Potassium 4.1; Sodium 137    Lipid Panel Lab Results  Component Value Date   CHOL 192 01/19/2018   HDL 37 (L) 01/19/2018   LDLCALC 119 (H) 01/19/2018   TRIG 253 (H) 01/19/2018      Wt Readings from  Last 3 Encounters:  04/19/18 (!) 359 lb 3.2 oz (162.9 kg)  04/17/18 (!) 355 lb (161 kg)  04/12/18 (!) 355 lb (161 kg)       ASSESSMENT AND PLAN:  No diagnosis found.   Disposition:   F/U  6 months  No orders of the defined types were placed in this encounter.    Signed, Esmond Plants, M.D., Ph.D. 05/11/2018  [place], [place]

## 2018-05-12 ENCOUNTER — Ambulatory Visit: Payer: Medicaid Other | Admitting: Cardiovascular Disease

## 2018-05-12 ENCOUNTER — Telehealth: Payer: Self-pay | Admitting: Pulmonary Disease

## 2018-05-12 NOTE — Telephone Encounter (Signed)
Per [facility], they have attempted to schedule Sleep Study for patient for several months by contacting by phone, mailing letters for patient to contact office to schedule and have received no response from patient to attempt to schedule.  Per [facility] if pt wants to have Sleep Study schedule, she will have to be referred to another Sleep Center.    Just FYI for provider. Rhonda J Cobb

## 2018-05-24 ENCOUNTER — Other Ambulatory Visit: Payer: Self-pay | Admitting: Family Medicine

## 2018-05-26 NOTE — Telephone Encounter (Signed)
Physician aware. Rhonda J Cobb ° °

## 2018-05-29 NOTE — Telephone Encounter (Signed)
Reviewed last sgpt and lipids; rx approved

## 2018-06-09 ENCOUNTER — Encounter: Payer: Self-pay | Admitting: Obstetrics and Gynecology

## 2018-06-09 ENCOUNTER — Ambulatory Visit (INDEPENDENT_AMBULATORY_CARE_PROVIDER_SITE_OTHER): Payer: Medicaid Other | Admitting: Obstetrics and Gynecology

## 2018-06-09 ENCOUNTER — Other Ambulatory Visit (HOSPITAL_COMMUNITY)
Admission: RE | Admit: 2018-06-09 | Discharge: 2018-06-09 | Disposition: A | Discharge status: Home / Self Care | Payer: Medicaid Other | Source: Ambulatory Visit | Attending: Obstetrics and Gynecology | Admitting: Obstetrics and Gynecology

## 2018-06-09 VITALS — BP 134/90 | HR 106 | Ht 66.0 in | Wt 349.0 lb

## 2018-06-09 DIAGNOSIS — N898 Other specified noninflammatory disorders of vagina: Secondary | ICD-10-CM

## 2018-06-09 DIAGNOSIS — N83201 Unspecified ovarian cyst, right side: Secondary | ICD-10-CM

## 2018-06-09 DIAGNOSIS — Z124 Encounter for screening for malignant neoplasm of cervix: Secondary | ICD-10-CM | POA: Diagnosis not present

## 2018-06-09 DIAGNOSIS — F520 Hypoactive sexual desire disorder: Secondary | ICD-10-CM | POA: Insufficient documentation

## 2018-06-09 DIAGNOSIS — E28319 Asymptomatic premature menopause: Secondary | ICD-10-CM | POA: Insufficient documentation

## 2018-06-09 DIAGNOSIS — D251 Intramural leiomyoma of uterus: Secondary | ICD-10-CM

## 2018-06-09 DIAGNOSIS — R102 Pelvic and perineal pain: Secondary | ICD-10-CM | POA: Diagnosis not present

## 2018-06-09 DIAGNOSIS — L680 Hirsutism: Secondary | ICD-10-CM

## 2018-06-09 DIAGNOSIS — Z6841 Body Mass Index (BMI) 40.0 and over, adult: Secondary | ICD-10-CM

## 2018-06-09 DIAGNOSIS — IMO0001 Reserved for inherently not codable concepts without codable children: Secondary | ICD-10-CM

## 2018-06-09 NOTE — Progress Notes (Signed)
Patient ID: Krista Mcgee, female   DOB: May 30, 1976, 42 y.o.   MRN: [id number]  Reason for Consult: Referral (Growing beard, some pelvic pain in all LQ)   Referred by Arnetha Courser, MD  Subjective:     HPI:  Krista Mcgee is a 42 y.o. female. She is here for concerns regarding dark facial hair growth and diffuse lower pelvic pain  Past Medical History:  Diagnosis Date  . Anxiety   . Depression   . Diabetes mellitus without complication ([place])   . Hypertension    Family History  Problem Relation Age of Onset  . Cancer Mother        ovarian  . Other Father        tumor in his ear drum  . Diabetes Father   . Heart disease Father   . Hyperlipidemia Father   . Hypertension Father   . Kidney disease Daughter        hydronephrosis  . Arthritis Paternal Grandmother   . COPD Paternal Grandmother   . Emphysema Paternal Grandmother   . Heart disease Paternal Grandmother   . Cancer Paternal Grandfather        skin  . COPD Paternal Grandfather   . Emphysema Paternal Grandfather    Past Surgical History:  Procedure Laterality Date  . CHOLECYSTECTOMY    . OOPHORECTOMY      Short Social History:  Social History   Tobacco Use  . Smoking status: Former Research scientist (life sciences)  . Smokeless tobacco: Never Used  . Tobacco comment: has not smoked in about 49yrs.  Substance Use Topics  . Alcohol use: Yes    Alcohol/week: 0.0 standard drinks    Comment: occasionally    No Known Allergies  Current Outpatient Medications  Medication Sig Dispense Refill  . ABILIFY 5 MG tablet TAKE 1 AND 1/2 TABLETS BY MOUTH AT BEDTIME  5  . albuterol (PROVENTIL HFA;VENTOLIN HFA) 108 (90 Base) MCG/ACT inhaler Inhale 1-2 puffs into the lungs every 4 (four) hours as needed for wheezing or shortness of breath. 1 Inhaler 10  . ALPRAZolam (XANAX) 1 MG tablet TAKE 1 TABLET BY MOUTH 3 TIMES A DAY AS NEEDED FOR ANXIETY  2  . amitriptyline (ELAVIL) 25 MG tablet TAKE 1 TABLET BY MOUTH EVERYDAY AT BEDTIME  2  .  atorvastatin (LIPITOR) 10 MG tablet TAKE 1 TABLET BY MOUTH EVERYDAY AT BEDTIME 30 tablet 5  . budesonide-formoterol (SYMBICORT) 160-4.5 MCG/ACT inhaler Inhale 2 puffs into the lungs 2 (two) times daily. 1 Inhaler 11  . buPROPion (WELLBUTRIN XL) 300 MG 24 hr tablet Take 300 mg by mouth every morning.  2  . empagliflozin (JARDIANCE) 10 MG TABS tablet Take 10 mg by mouth daily. 30 tablet 5  . metFORMIN (GLUCOPHAGE-XR) 500 MG 24 hr tablet Take 2 tablets (1,000 mg total) by mouth daily. 60 tablet 5  . SAPHRIS 10 MG SUBL Take 10 mg by mouth at bedtime.  1  . venlafaxine XR (EFFEXOR-XR) 75 MG 24 hr capsule Take 225 mg by mouth daily.  5   No current facility-administered medications for this visit.     REVIEW OF SYSTEMS      Objective:  Objective   Vitals:   06/09/18 0844  BP: 134/90  Pulse: (!) 106  Weight: (!) 349 lb (158.3 kg)  Height: 5\' 6"  (1.676 m)   Body mass index is 56.33 kg/m.  Physical Exam  Constitutional: She is oriented to person, place, and time. She appears well-developed  and well-nourished.  HENT:  Head: Normocephalic and atraumatic.  Eyes: Pupils are equal, round, and reactive to light. EOM are normal.  Cardiovascular: Normal rate and regular rhythm.  Pulmonary/Chest: Effort normal. No respiratory distress.  Abdominal: Soft. She exhibits no distension. There is no tenderness. There is no guarding.  Morbid obesity, limited exam  Genitourinary: Vagina normal and uterus normal. No vaginal discharge found.  Neurological: She is alert and oriented to person, place, and time.  Skin: Skin is warm and dry.  Psychiatric: She has a normal mood and affect. Her behavior is normal. Judgment and thought content normal.  Nursing note and vitals reviewed.   Data: CT scan reviewed     Assessment/Plan:     42 yo with pelvic pain, vaginal dryness, low sexual desire, hirsutism and right ovarian cyst, fibroid uterus.  1. Will obtain FSH and estradiol to assess menopausal  status. She reports she has not had a period since she was 45. Pre and postmenopausal work up for hirsutism is different.  2. Will obtain pelvic US to better characterize the right ovarian cyst.   Follow up in 1 week.     Homero Fellers MD [facility] 06/09/18 10:05 AM

## 2018-06-10 LAB — FOLLICLE STIMULATING HORMONE: FSH: 3.8 m[IU]/mL

## 2018-06-10 LAB — ESTRADIOL: Estradiol: 75.3 pg/mL

## 2018-06-14 NOTE — Progress Notes (Signed)
Premenopausal, released to mychart.

## 2018-06-15 LAB — CYTOLOGY - PAP
Diagnosis: NEGATIVE
HPV: NOT DETECTED

## 2018-06-16 ENCOUNTER — Ambulatory Visit: Payer: Medicaid Other | Admitting: Obstetrics and Gynecology

## 2018-06-16 ENCOUNTER — Other Ambulatory Visit: Payer: Medicaid Other

## 2018-07-04 ENCOUNTER — Other Ambulatory Visit: Payer: Self-pay | Admitting: Obstetrics and Gynecology

## 2018-07-04 DIAGNOSIS — A599 Trichomoniasis, unspecified: Secondary | ICD-10-CM

## 2018-07-04 MED ORDER — METRONIDAZOLE 500 MG PO TABS: 2000.0000 mg | ORAL_TABLET | Freq: Once | ORAL | 1 refills | Status: AC

## 2018-07-04 NOTE — Progress Notes (Signed)
Called and discussed infection with patient. She and her sexual partner will be treated, she will return for a test of cure.

## 2018-07-10 DIAGNOSIS — I498 Other specified cardiac arrhythmias: Secondary | ICD-10-CM | POA: Insufficient documentation

## 2018-07-10 DIAGNOSIS — R9431 Abnormal electrocardiogram [ECG] [EKG]: Secondary | ICD-10-CM | POA: Insufficient documentation

## 2018-07-10 NOTE — Progress Notes (Deleted)
No SHOW

## 2018-07-11 ENCOUNTER — Ambulatory Visit: Payer: Medicaid Other | Admitting: Cardiovascular Disease

## 2018-07-13 ENCOUNTER — Encounter: Payer: Self-pay | Admitting: Cardiovascular Disease

## 2018-10-02 ENCOUNTER — Telehealth: Payer: Self-pay | Admitting: Family Medicine

## 2018-10-02 NOTE — Telephone Encounter (Signed)
Patient is overdue for an appointment with me Please schedule first available for diabetes I'll refill med and we'll her very soon

## 2018-10-03 NOTE — Telephone Encounter (Signed)
lvm informing pt that script has been sent to pharmacy but did ask pt to schedule appt

## 2018-10-14 ENCOUNTER — Telehealth: Payer: Self-pay

## 2018-10-14 NOTE — Telephone Encounter (Signed)
So patients need to have an appointment for clearance I marked that on the sheet, so someone should be scheduling her an appointment for clearance First available

## 2018-10-14 NOTE — Telephone Encounter (Signed)
Pt is scheduled to see you on 10/17/18.  Copied from [place] [phone]. Topic: General - Other >> Oct 12, 2018  2:04 PM Oneta Rack wrote: Relation to pt: self  Call back number: [phone]   Reason for call:  [facility] [place] [facility] faxed over surgical clearance form due to patient being a diabetic, specialist will not proceed with dental procedure until form is completed and faxed back, patient states she is in pain and would like an update regarding if form can be expedited, please advise

## 2018-10-17 ENCOUNTER — Ambulatory Visit: Payer: Medicaid Other | Admitting: Family Medicine

## 2018-10-17 ENCOUNTER — Encounter: Payer: Self-pay | Admitting: Family Medicine

## 2018-10-17 ENCOUNTER — Other Ambulatory Visit: Payer: Self-pay | Admitting: Family Medicine

## 2018-10-17 VITALS — BP 126/72 | HR 99 | Temp 97.8°F | Resp 14 | Ht 67.0 in | Wt 349.0 lb

## 2018-10-17 DIAGNOSIS — D72829 Elevated white blood cell count, unspecified: Secondary | ICD-10-CM

## 2018-10-17 DIAGNOSIS — E786 Lipoprotein deficiency: Secondary | ICD-10-CM

## 2018-10-17 DIAGNOSIS — R109 Unspecified abdominal pain: Secondary | ICD-10-CM

## 2018-10-17 DIAGNOSIS — Z5181 Encounter for therapeutic drug level monitoring: Secondary | ICD-10-CM

## 2018-10-17 DIAGNOSIS — L83 Acanthosis nigricans: Secondary | ICD-10-CM

## 2018-10-17 DIAGNOSIS — Z6841 Body Mass Index (BMI) 40.0 and over, adult: Secondary | ICD-10-CM

## 2018-10-17 DIAGNOSIS — E119 Type 2 diabetes mellitus without complications: Secondary | ICD-10-CM

## 2018-10-17 DIAGNOSIS — E559 Vitamin D deficiency, unspecified: Secondary | ICD-10-CM

## 2018-10-17 DIAGNOSIS — E781 Pure hyperglyceridemia: Secondary | ICD-10-CM | POA: Diagnosis not present

## 2018-10-17 DIAGNOSIS — K760 Fatty (change of) liver, not elsewhere classified: Secondary | ICD-10-CM | POA: Diagnosis not present

## 2018-10-17 DIAGNOSIS — G8929 Other chronic pain: Secondary | ICD-10-CM

## 2018-10-17 LAB — POCT URINALYSIS DIPSTICK
Bilirubin, UA: NEGATIVE
Glucose, UA: NEGATIVE
KETONES UA: NEGATIVE
LEUKOCYTES UA: NEGATIVE
Nitrite, UA: NEGATIVE
Protein, UA: NEGATIVE
RBC UA: NEGATIVE
UROBILINOGEN UA: NEGATIVE U/dL — AB
pH, UA: 5 (ref 5.0–8.0)

## 2018-10-17 LAB — POCT GLYCOSYLATED HEMOGLOBIN (HGB A1C)
HEMOGLOBIN A1C: 6.1 % (ref 4.0–5.6)
HbA1c, POC (controlled diabetic range): 6.1 % (ref 0.0–7.0)
HbA1c, POC (prediabetic range): 6.1 % (ref 5.7–6.4)
Hemoglobin A1C: 6.1 % — AB (ref 4.0–5.6)

## 2018-10-17 NOTE — Progress Notes (Signed)
Refer to hematologist °

## 2018-10-17 NOTE — Assessment & Plan Note (Signed)
Refer to bariatric surgeon

## 2018-10-17 NOTE — Progress Notes (Signed)
BP 126/72   Pulse 99   Temp 97.8 F (36.6 C) (Oral)   Resp 14   Ht 5\' 7"  (1.702 m)   Wt (!) 349 lb (158.3 kg)   LMP 09/07/2015 (LMP Unknown) Comment: oophorectomy, no period since  SpO2 94%   BMI 54.66 kg/m    Subjective:    Patient ID: Krista Mcgee, female    DOB: [birth year], 43 y.o.   MRN: [id number]  HPI: Krista Mcgee is a 43 y.o. female  Chief Complaint  Patient presents with  . paperwork  . Flank Pain    right side, off and on for a while    HPI Patient is here for dental procedure clearance; having two teeth removed, they hurt and she has a bad toothache; one tooth broke; no fevers or infection  However, she is also complaining of flank pain, right side; going on for a while, maybe a couple of months; intermittent; more than a few times a week; really crampy; feels like a catch in her side, comes around to the front of the stomach; eating does not help; happens just after eating too; not tender to the touch; she does not have her gallbladder; starts on the right side, comes around and over the belly button; could be liver and pancreas, looking on Web MD; no fevers; just uncomfortable; not sharp searing pain; at its worst, 5 out of 10; laying down helps it; no nausea or vomiting  Morbid obesity; patient has not lost any net weight in the last 4 months; followed up with gastric bypass doctor, they did not take her insurance though  She has type 2 diabetes Last A1c was 6.7 in May 2019 Her urine microalbumin:Cr was normal in May 2019 She is overdue for an eye exam She has given up sodas; rarely eating pasta and potatoes and bread She has cut out a lot of sweets  Vitamin D deficiency Last reading in May was 23, up from 9 a year ago She was instructed to take OTC vit D at that time  Elevated white blood cell count; 11.9k in May, 11.5k in July 2018, 13.5k in May 2018  Dyslipidemia HDL was only 37, LDL was 119, TG were 253 in May 2019  No hx of hepatitis; no  IVDU; multiple tattoos; no travel outside of the country   Depression screen [facility] 2/9 10/17/2018 03/03/2018 01/19/2018 03/19/2017 03/09/2017  Decreased Interest 1 1 0 0 0  Down, Depressed, Hopeless 2 1 1 1 1   PHQ - 2 Score 3 2 1 1 1   Altered sleeping 0 3 - - -  Tired, decreased energy 3 3 - - -  Change in appetite 3 0 - - -  Feeling bad or failure about yourself  0 1 - - -  Trouble concentrating 0 1 - - -  Moving slowly or fidgety/restless 0 0 - - -  Suicidal thoughts 0 0 - - -  PHQ-9 Score 9 10 - - -  Difficult doing work/chores Somewhat difficult Somewhat difficult - - -  MD: no si/hi Fall Risk  10/17/2018 04/19/2018 03/03/2018 01/19/2018 03/19/2017  Falls in the past year? 0 No No No No  Number falls in past yr: 0 - - - -  Injury with Fall? 0 - - - -    Relevant past medical, surgical, family and social history reviewed Past Medical History:  Diagnosis Date  . Anxiety   . Depression   . Diabetes mellitus without complication (  [place])   . Hypertension    Past Surgical History:  Procedure Laterality Date  . CHOLECYSTECTOMY    . OOPHORECTOMY     Family History  Problem Relation Age of Onset  . Cancer Mother        ovarian  . Other Father        tumor in his ear drum  . Diabetes Father   . Heart disease Father   . Hyperlipidemia Father   . Hypertension Father   . Kidney disease Daughter        hydronephrosis  . Arthritis Paternal Grandmother   . COPD Paternal Grandmother   . Emphysema Paternal Grandmother   . Heart disease Paternal Grandmother   . Cancer Paternal Grandfather        skin  . COPD Paternal Grandfather   . Emphysema Paternal Grandfather    Social History   Tobacco Use  . Smoking status: Former Research scientist (life sciences)  . Smokeless tobacco: Never Used  . Tobacco comment: has not smoked in about 36yrs.  Substance Use Topics  . Alcohol use: Yes    Alcohol/week: 0.0 standard drinks    Comment: occasionally  . Drug use: No     Office Visit from 10/17/2018 in [facility]  AUDIT-C Score  0      Interim medical history since last visit reviewed. Allergies and medications reviewed  Review of Systems  Constitutional: Negative for unexpected weight change.  Respiratory: Negative for wheezing.   Cardiovascular: Negative for chest pain.  Gastrointestinal: Positive for abdominal pain. Negative for blood in stool, nausea and vomiting.       Goes regularly, every day, softer after cholecystectomy  Endocrine: Positive for polyuria. Negative for polydipsia.  Genitourinary: Negative for hematuria.   Per HPI unless specifically indicated above     Objective:    BP 126/72   Pulse 99   Temp 97.8 F (36.6 C) (Oral)   Resp 14   Ht 5\' 7"  (1.702 m)   Wt (!) 349 lb (158.3 kg)   LMP 09/07/2015 (LMP Unknown) Comment: oophorectomy, no period since  SpO2 94%   BMI 54.66 kg/m   Wt Readings from Last 3 Encounters:  10/17/18 (!) 349 lb (158.3 kg)  06/09/18 (!) 349 lb (158.3 kg)  04/19/18 (!) 359 lb 3.2 oz (162.9 kg)    Physical Exam Constitutional:      General: She is not in acute distress.    Appearance: She is well-developed. She is obese. She is not diaphoretic.     Comments: Morbidly obese  HENT:     Head: Normocephalic and atraumatic.  Eyes:     General: No scleral icterus. Neck:     Thyroid: No thyromegaly.  Cardiovascular:     Rate and Rhythm: Normal rate and regular rhythm.     Heart sounds: Normal heart sounds. No murmur.  Pulmonary:     Effort: Pulmonary effort is normal. No respiratory distress.     Breath sounds: Normal breath sounds. No wheezing.  Abdominal:     General: Bowel sounds are normal. There is no distension.     Palpations: Abdomen is soft.     Comments: Large abdomen, morbidly obese body habitus prevents me from palpating any internal organs  Skin:    General: Skin is warm and dry.     Coloration: Skin is not pale.     Comments: Numerous skin tags around the neck; velvety hyperpigmentation around  the neck consistent with acanthosis nigricans  Neurological:     Mental Status: She is alert.  Psychiatric:        Mood and Affect: Mood is not depressed.        Behavior: Behavior normal.        Judgment: Judgment normal.    Diabetic Foot Form - Detailed   Diabetic Foot Exam - detailed Diabetic Foot exam was performed with the following findings:  Yes 10/17/2018  8:52 AM  Visual Foot Exam completed.:  Yes  Pulse Foot Exam completed.:  Yes  Right Dorsalis Pedis:  Present Left Dorsalis Pedis:  Present  Sensory Foot Exam Completed.:  Yes Semmes-Weinstein Monofilament Test          Assessment & Plan:   Problem List Items Addressed This Visit      Digestive   Hepatic steatosis    Now with RUQ pain; will get [place] to evaluate; weight loss is key        Endocrine   Type 2 diabetes mellitus ([place]) - Primary (Chronic)    Foot exam by MD: eye exam urged; she has the number and she will call for an appointment; praise given for giving up sodas etc; A1c showed good control today      Relevant Orders   POCT glycosylated hemoglobin (Hb A1C) (Completed)   Lipid panel (Completed)   COMPLETE METABOLIC PANEL WITH GFR (Completed)   Amb Referral to Bariatric Surgery     Other   Vitamin D deficiency    Check level today; can exacerbate depression      Relevant Orders   VITAMIN D 25 Hydroxy (Vit-D Deficiency, Fractures) (Completed)   Morbid obesity with BMI of 50.0-59.9, adult ([place])    Refer to bariatric surgeon      Relevant Orders   Amb Referral to Bariatric Surgery   Medication monitoring encounter    Check liver and kidneys      Relevant Orders   COMPLETE METABOLIC PANEL WITH GFR (Completed)   Low HDL (under 40)    Weight loss will help; encouraged her to start making walking part of her day, build up slowly and gradually      Leukocytosis    Quit smoking a few years ago, 5+ years ago; will check CBC and diff and path review      Relevant Orders   CBC with  Differential/Platelet (Completed)   Pathologist smear review (Completed)   High triglycerides    Check lipids today; praise given for giving up sweets and starches       Other Visit Diagnoses    Flank pain       Relevant Orders   POCT urinalysis dipstick (Completed)   CBC with Differential/Platelet (Completed)   Chronic right flank pain       Relevant Orders   US Abdomen Complete   Hepatitis panel, acute   Right sided abdominal pain       Relevant Orders   US Abdomen Complete   Acanthosis nigricans       Relevant Orders   Insulin, Free (Bioactive)       Follow up plan: No follow-ups on file.  An after-visit summary was printed and given to the patient at [place].  Please see the patient instructions which may contain other information and recommendations beyond what is mentioned above in the assessment and plan.  No orders of the defined types were placed in this encounter.   Orders Placed This Encounter  Procedures  . US Abdomen Complete  . Lipid  panel  . COMPLETE METABOLIC PANEL WITH GFR  . CBC with Differential/Platelet  . Pathologist smear review  . VITAMIN D 25 Hydroxy (Vit-D Deficiency, Fractures)  . Insulin, Free (Bioactive)  . Hepatitis panel, acute  . Hepatitis panel, acute  . Amb Referral to Bariatric Surgery  . POCT urinalysis dipstick  . POCT glycosylated hemoglobin (Hb A1C)

## 2018-10-17 NOTE — Assessment & Plan Note (Signed)
Quit smoking a few years ago, 5+ years ago; will check CBC and diff and path review

## 2018-10-17 NOTE — Assessment & Plan Note (Signed)
Check liver and kidneys 

## 2018-10-17 NOTE — Patient Instructions (Addendum)
Check out the information at familydoctor.org entitled "Nutrition for Weight Loss: What You Need to Know about Fad Diets" Try to lose between 1-2 pounds per week by taking in fewer calories and burning off more calories You can succeed by limiting portions, limiting foods dense in calories and fat, becoming more active, and drinking 8 glasses of water a day (64 ounces) Don't skip meals, especially breakfast, as skipping meals may alter your metabolism Do not use over-the-counter weight loss pills or gimmicks that claim rapid weight loss A healthy BMI (or body mass index) is between 18.5 and 24.9 You can calculate your ideal BMI at the [place] website ClubMonetize.fr  Check out MyFitnessPal or other free calorie counter to help you match your calories in and out Use a step tracker or other device to keep up with calories out and steps per day    Obesity, Adult Obesity is the condition of having too much total body fat. Being overweight or obese means that your weight is greater than what is considered healthy for your body size. Obesity is determined by a measurement called BMI. BMI is an estimate of body fat and is calculated from height and weight. For adults, a BMI of 30 or higher is considered obese. Obesity can eventually lead to other health concerns and major illnesses, including:  Stroke.  Coronary artery disease (CAD).  Type 2 diabetes.  Some types of cancer, including cancers of the colon, breast, uterus, and gallbladder.  Osteoarthritis.  High blood pressure (hypertension).  High cholesterol.  Sleep apnea.  Gallbladder stones.  Infertility problems. What are the causes? The main cause of obesity is taking in (consuming) more calories than your body uses for energy. Other factors that contribute to this condition may include:  Being born with genes that make you more likely to become obese.  Having a medical condition  that causes obesity. These conditions include: ? Hypothyroidism. ? Polycystic ovarian syndrome (PCOS). ? Binge-eating disorder. ? Cushing syndrome.  Taking certain medicines, such as steroids, antidepressants, and seizure medicines.  Not being physically active (sedentary lifestyle).  Living where there are limited places to exercise safely or buy healthy foods.  Not getting enough sleep. What increases the risk? The following factors may increase your risk of this condition:  Having a family history of obesity.  Being a woman of African-American descent.  Being a man of Hispanic descent. What are the signs or symptoms? Having excessive body fat is the main symptom of this condition. How is this diagnosed? This condition may be diagnosed based on:  Your symptoms.  Your medical history.  A physical exam. Your health care provider may measure: ? Your BMI. If you are an adult with a BMI between 25 and less than 30, you are considered overweight. If you are an adult with a BMI of 30 or higher, you are considered obese. ? The distances around your hips and your waist (circumferences). These may be compared to each other to help diagnose your condition. ? Your skinfold thickness. Your health care provider may gently pinch a fold of your skin and measure it. How is this treated? Treatment for this condition often includes changing your lifestyle. Treatment may include some or all of the following:  Dietary changes. Work with your health care provider and a dietitian to set a weight-loss goal that is healthy and reasonable for you. Dietary changes may include eating: ? Smaller portions. A portion size is the amount of a particular food that is  healthy for you to eat at one time. This varies from person to person. ? Low-calorie or low-fat options. ? More whole grains, fruits, and vegetables.  Regular physical activity. This may include aerobic activity (cardio) and strength  training.  Medicine to help you lose weight. Your health care provider may prescribe medicine if you are unable to lose 1 pound a week after 6 weeks of eating more healthily and doing more physical activity.  Surgery. Surgical options may include gastric banding and gastric bypass. Surgery may be done if: ? Other treatments have not helped to improve your condition. ? You have a BMI of 40 or higher. ? You have life-threatening health problems related to obesity. Follow these instructions at home:  Eating and drinking   Follow recommendations from your health care provider about what you eat and drink. Your health care provider may advise you to: ? Limit fast foods, sweets, and processed snack foods. ? Choose low-fat options, such as low-fat milk instead of whole milk. ? Eat 5 or more servings of fruits or vegetables every day. ? Eat at home more often. This gives you more control over what you eat. ? Choose healthy foods when you eat out. ? Learn what a healthy portion size is. ? Keep low-fat snacks on hand. ? Avoid sugary drinks, such as soda, fruit juice, iced tea sweetened with sugar, and flavored milk. ? Eat a healthy breakfast.  Drink enough water to keep your urine clear or pale yellow.  Do not go without eating for long periods of time (do not fast) or follow a fad diet. Fasting and fad diets can be unhealthy and even dangerous. Physical Activity  Exercise regularly, as told by your health care provider. Ask your health care provider what types of exercise are safe for you and how often you should exercise.  Warm up and stretch before being active.  Cool down and stretch after being active.  Rest between periods of activity. Lifestyle  Limit the time that you spend in front of your TV, computer, or video game system.  Find ways to reward yourself that do not involve food.  Limit alcohol intake to no more than 1 drink a day for nonpregnant women and 2 drinks a day for  men. One drink equals 12 oz of beer, 5 oz of wine, or 1 oz of hard liquor. General instructions  Keep a weight loss journal to keep track of the food you eat and how much you exercise you get.  Take over-the-counter and prescription medicines only as told by your health care provider.  Take vitamins and supplements only as told by your health care provider.  Consider joining a support group. Your health care provider may be able to recommend a support group.  Keep all follow-up visits as told by your health care provider. This is important. Contact a health care provider if:  You are unable to meet your weight loss goal after 6 weeks of dietary and lifestyle changes. This information is not intended to replace advice given to you by your health care provider. Make sure you discuss any questions you have with your health care provider. Document Released: 09/17/2004 Document Revised: 01/13/2016 Document Reviewed: 05/29/2015 Elsevier Interactive Patient Education  2019 Elsevier Inc.  Preventing Unhealthy [employer], Adult Staying at a healthy weight is important to your overall health. When fat builds up in your body, you may become overweight or obese. Being overweight or obese increases your risk of developing certain  health problems, such as heart disease, diabetes, sleeping problems, joint problems, and some types of cancer. Unhealthy weight gain is often the result of making unhealthy food choices or not getting enough exercise. You can make changes to your lifestyle to prevent obesity and stay as healthy as possible. What nutrition changes can be made?   Eat only as much as your body needs. To do this: ? Pay attention to signs that you are hungry or full. Stop eating as soon as you feel full. ? If you feel hungry, try drinking water first before eating. Drink enough water so your urine is clear or pale yellow. ? Eat smaller portions. Pay attention to portion sizes when eating  out. ? Look at serving sizes on food labels. Most foods contain more than one serving per container. ? Eat the recommended number of calories for your gender and activity level. For most active people, a daily total of 2,000 calories is appropriate. If you are trying to lose weight or are not very active, you may need to eat fewer calories. Talk with your health care provider or a diet and nutrition specialist (dietitian) about how many calories you need each day.  Choose healthy foods, such as: ? Fruits and vegetables. At each meal, try to fill at least half of your plate with fruits and vegetables. ? Whole grains, such as whole-wheat bread, brown rice, and quinoa. ? Lean meats, such as chicken or fish. ? Other healthy proteins, such as beans, eggs, or tofu. ? Healthy fats, such as nuts, seeds, fatty fish, and olive oil. ? Low-fat or fat-free dairy products.  Check food labels, and avoid food and drinks that: ? Are high in calories. ? Have added sugar. ? Are high in sodium. ? Have saturated fats or trans fats.  Cook foods in healthier ways, such as by baking, broiling, or grilling.  Make a meal plan for the week, and shop with a grocery list to help you stay on track with your purchases. Try to avoid going to the grocery store when you are hungry.  When grocery shopping, try to shop around the outside of the store first, where the fresh foods are. Doing this helps you to avoid prepackaged foods, which can be high in sugar, salt (sodium), and fat. What lifestyle changes can be made?   Exercise for 30 or more minutes on 5 or more days each week. Exercising may include brisk walking, yard work, biking, running, swimming, and team sports like basketball and soccer. Ask your health care provider which exercises are safe for you.  Do muscle-strengthening activities, such as lifting weights or using resistance bands, on 2 or more days a week.  Do not use any products that contain nicotine or  tobacco, such as cigarettes and e-cigarettes. If you need help quitting, ask your health care provider.  Limit alcohol intake to no more than 1 drink a day for nonpregnant women and 2 drinks a day for men. One drink equals 12 oz of beer, 5 oz of wine, or 1 oz of hard liquor.  Try to get 7-9 hours of sleep each night. What other changes can be made?  Keep a food and activity journal to keep track of: ? What you ate and how many calories you had. Remember to count the calories in sauces, dressings, and side dishes. ? Whether you were active, and what exercises you did. ? Your calorie, weight, and activity goals.  Check your weight regularly. Track any changes.  If you notice you have gained weight, make changes to your diet or activity routine.  Avoid taking weight-loss medicines or supplements. Talk to your health care provider before starting any new medicine or supplement.  Talk to your health care provider before trying any new diet or exercise plan. Why are these changes important? Eating healthy, staying active, and having healthy habits can help you to prevent obesity. Those changes also:  Help you manage stress and emotions.  Help you connect with friends and family.  Improve your self-esteem.  Improve your sleep.  Prevent long-term health problems. What can happen if changes are not made? Being obese or overweight can cause you to develop joint or bone problems, which can make it hard for you to stay active or do activities you enjoy. Being obese or overweight also puts stress on your heart and lungs and can lead to health problems like diabetes, heart disease, and some cancers. Where to find more information Talk with your health care provider or a dietitian about healthy eating and healthy lifestyle choices. You may also find information from:  U.S. Department of Agriculture, MyPlate: FormerBoss.no  American Heart Association: www.heart.org  Centers for Disease  Control and Prevention: http://www.wolf.info/ Summary  Staying at a healthy weight is important to your overall health. It helps you to prevent certain diseases and health problems, such as heart disease, diabetes, joint problems, sleep disorders, and some types of cancer.  Being obese or overweight can cause you to develop joint or bone problems, which can make it hard for you to stay active or do activities you enjoy.  You can prevent unhealthy weight gain by eating a healthy diet, exercising regularly, not smoking, limiting alcohol, and getting enough sleep.  Talk with your health care provider or a dietitian for guidance about healthy eating and healthy lifestyle choices. This information is not intended to replace advice given to you by your health care provider. Make sure you discuss any questions you have with your health care provider. Document Released: 08/11/2016 Document Revised: 05/21/2017 Document Reviewed: 09/16/2016 Elsevier Interactive Patient Education  2019 [employer].

## 2018-10-17 NOTE — Assessment & Plan Note (Signed)
Check lipids today; praise given for giving up sweets and starches

## 2018-10-17 NOTE — Assessment & Plan Note (Signed)
Now with RUQ pain; will get [place] to evaluate; weight loss is key

## 2018-10-17 NOTE — Assessment & Plan Note (Signed)
Foot exam by MD: eye exam urged; she has the number and she will call for an appointment; praise given for giving up sodas etc; A1c showed good control today

## 2018-10-17 NOTE — Assessment & Plan Note (Signed)
Weight loss will help; encouraged her to start making walking part of her day, build up slowly and gradually

## 2018-10-17 NOTE — Assessment & Plan Note (Signed)
Check level today; can exacerbate depression

## 2018-10-18 ENCOUNTER — Other Ambulatory Visit: Payer: Self-pay | Admitting: Family Medicine

## 2018-10-18 MED ORDER — VITAMIN D (ERGOCALCIFEROL) 1.25 MG (50000 UNIT) PO CAPS: 50000.0000 [IU] | ORAL_CAPSULE | ORAL | 1 refills | Status: DC

## 2018-10-18 NOTE — Progress Notes (Signed)
Vit D Rx °

## 2018-10-24 ENCOUNTER — Ambulatory Visit: Payer: Medicaid Other | Attending: Family Medicine

## 2018-10-24 ENCOUNTER — Telehealth: Payer: Self-pay

## 2018-10-24 NOTE — Telephone Encounter (Signed)
Pt notified to reschedule.   Copied from [place] [phone]. Topic: General - Other >> Oct 24, 2018 10:47 AM Jeri Cos wrote: Reason for CRM: Gregary Signs [facility]) called and wanted to let Dr. Sanda Klein know that the pt did not show for her appt today.

## 2018-10-25 ENCOUNTER — Encounter: Payer: Self-pay | Admitting: Family Medicine

## 2018-10-25 ENCOUNTER — Inpatient Hospital Stay: Payer: Medicaid Other | Attending: Oncology | Admitting: Oncology

## 2018-10-25 DIAGNOSIS — E161 Other hypoglycemia: Secondary | ICD-10-CM

## 2018-10-25 HISTORY — DX: Other hypoglycemia: E16.1

## 2018-10-25 LAB — CBC WITH DIFFERENTIAL/PLATELET
Absolute Monocytes: 495 cells/uL (ref 200–950)
BASOS ABS: 60 {cells}/uL (ref 0–200)
BASOS PCT: 0.4 %
EOS PCT: 4.3 %
Eosinophils Absolute: 645 cells/uL — ABNORMAL HIGH (ref 15–500)
HCT: 41.1 % (ref 35.0–45.0)
HEMOGLOBIN: 13.5 g/dL (ref 11.7–15.5)
Lymphs Abs: 3840 cells/uL (ref 850–3900)
MCH: 27.1 pg (ref 27.0–33.0)
MCHC: 32.8 g/dL (ref 32.0–36.0)
MCV: 82.4 fL (ref 80.0–100.0)
MONOS PCT: 3.3 %
MPV: 9.4 fL (ref 7.5–12.5)
NEUTROS ABS: 9960 {cells}/uL — AB (ref 1500–7800)
Neutrophils Relative %: 66.4 %
PLATELETS: 347 10*3/uL (ref 140–400)
RBC: 4.99 10*6/uL (ref 3.80–5.10)
RDW: 15.1 % — ABNORMAL HIGH (ref 11.0–15.0)
TOTAL LYMPHOCYTE: 25.6 %
WBC: 15 10*3/uL — ABNORMAL HIGH (ref 3.8–10.8)

## 2018-10-25 LAB — LIPID PANEL
CHOL/HDL RATIO: 5.8 (calc) — AB (ref <5.0)
Cholesterol: 190 mg/dL (ref <200)
HDL: 33 mg/dL — AB (ref >=50)
LDL CHOLESTEROL (CALC): 128 mg/dL — AB
NON-HDL CHOLESTEROL (CALC): 157 mg/dL — AB (ref <130)
TRIGLYCERIDES: 171 mg/dL — AB (ref <150)

## 2018-10-25 LAB — COMPLETE METABOLIC PANEL WITH GFR
AG RATIO: 1.2 (calc) (ref 1.0–2.5)
ALKALINE PHOSPHATASE (APISO): 79 U/L (ref 31–125)
ALT: 12 U/L (ref 6–29)
AST: 13 U/L (ref 10–30)
Albumin: 3.6 g/dL (ref 3.6–5.1)
BILIRUBIN TOTAL: 0.6 mg/dL (ref 0.2–1.2)
BUN: 17 mg/dL (ref 7–25)
CHLORIDE: 102 mmol/L (ref 98–110)
CO2: 27 mmol/L (ref 20–32)
Calcium: 8.8 mg/dL (ref 8.6–10.2)
Creat: 0.75 mg/dL (ref 0.50–1.10)
GFR, Est African American: 116 mL/min/{1.73_m2} (ref >=60)
GFR, Est Non African American: 100 mL/min/{1.73_m2} (ref >=60)
Globulin: 3.1 g/dL (calc) (ref 1.9–3.7)
Glucose, Bld: 113 mg/dL — ABNORMAL HIGH (ref 65–99)
POTASSIUM: 4.1 mmol/L (ref 3.5–5.3)
Sodium: 137 mmol/L (ref 135–146)
Total Protein: 6.7 g/dL (ref 6.1–8.1)

## 2018-10-25 LAB — HEPATITIS PANEL, ACUTE
HEP A IGM: NONREACTIVE
HEP C AB: NONREACTIVE
Hep B C IgM: NONREACTIVE
Hepatitis B Surface Ag: NONREACTIVE
SIGNAL TO CUT-OFF: 0.04 (ref <1.00)

## 2018-10-25 LAB — PATHOLOGIST SMEAR REVIEW

## 2018-10-25 LAB — INSULIN, FREE (BIOACTIVE): INSULIN, FREE: 65.8 u[IU]/mL — AB (ref 1.5–14.9)

## 2018-10-25 LAB — VITAMIN D 25 HYDROXY (VIT D DEFICIENCY, FRACTURES): Vit D, 25-Hydroxy: 13 ng/mL — ABNORMAL LOW (ref 30–100)

## 2018-10-31 ENCOUNTER — Emergency Department: Payer: Medicaid Other

## 2018-10-31 ENCOUNTER — Encounter: Payer: Self-pay | Admitting: Emergency Medicine

## 2018-10-31 ENCOUNTER — Emergency Department
Admission: EM | Admit: 2018-10-31 | Discharge: 2018-10-31 | Disposition: A | Discharge status: Home / Self Care | Payer: Medicaid Other | Attending: Emergency Medicine | Admitting: Emergency Medicine

## 2018-10-31 ENCOUNTER — Other Ambulatory Visit: Payer: Self-pay

## 2018-10-31 DIAGNOSIS — Z87891 Personal history of nicotine dependence: Secondary | ICD-10-CM | POA: Insufficient documentation

## 2018-10-31 DIAGNOSIS — Z79899 Other long term (current) drug therapy: Secondary | ICD-10-CM | POA: Insufficient documentation

## 2018-10-31 DIAGNOSIS — R062 Wheezing: Secondary | ICD-10-CM

## 2018-10-31 DIAGNOSIS — Z7984 Long term (current) use of oral hypoglycemic drugs: Secondary | ICD-10-CM | POA: Insufficient documentation

## 2018-10-31 DIAGNOSIS — R05 Cough: Secondary | ICD-10-CM | POA: Diagnosis not present

## 2018-10-31 DIAGNOSIS — J069 Acute upper respiratory infection, unspecified: Secondary | ICD-10-CM | POA: Insufficient documentation

## 2018-10-31 DIAGNOSIS — E119 Type 2 diabetes mellitus without complications: Secondary | ICD-10-CM | POA: Diagnosis not present

## 2018-10-31 DIAGNOSIS — B9789 Other viral agents as the cause of diseases classified elsewhere: Secondary | ICD-10-CM

## 2018-10-31 DIAGNOSIS — I10 Essential (primary) hypertension: Secondary | ICD-10-CM | POA: Diagnosis not present

## 2018-10-31 DIAGNOSIS — R509 Fever, unspecified: Secondary | ICD-10-CM | POA: Diagnosis present

## 2018-10-31 MED ORDER — IPRATROPIUM-ALBUTEROL 0.5-2.5 (3) MG/3ML IN SOLN
3.0000 mL | Freq: Once | RESPIRATORY_TRACT | Status: AC
  Administered 2018-10-31: 3 mL via RESPIRATORY_TRACT
  Filled 2018-10-31: qty 3

## 2018-10-31 MED ORDER — PSEUDOEPH-BROMPHEN-DM 30-2-10 MG/5ML PO SYRP: 5.0000 mL | ORAL_SOLUTION | Freq: Four times a day (QID) | ORAL | 0 refills | Status: DC | PRN

## 2018-10-31 MED ORDER — PREDNISONE 10 MG PO TABS: ORAL_TABLET | ORAL | 0 refills | Status: DC

## 2018-10-31 MED ORDER — SULFAMETHOXAZOLE-TRIMETHOPRIM 800-160 MG PO TABS: 1.0000 | ORAL_TABLET | Freq: Two times a day (BID) | ORAL | 0 refills | Status: DC

## 2018-10-31 NOTE — ED Notes (Signed)
See triage note  Presents with low grade fever for several days  And on arrival   States she is having some nasal congestion and cough

## 2018-10-31 NOTE — Discharge Instructions (Addendum)
Follow-up with your primary care provider if not improving in 4 to 5 days.  Return to the emergency department if any severe worsening of your symptoms.  Continue using your albuterol inhaler as prescribed by your doctor along with your Symbicort.  The prednisone that was prescribed for you today is a low dose but may also cause your blood sugar to elevate.  Be very mindful of what you are eating for the next several days.

## 2018-10-31 NOTE — ED Triage Notes (Signed)
Pt to ED via POV c/o fever, nasal and chest congestion, and cough since Friday. Pt is in NAD at this time.

## 2018-10-31 NOTE — ED Provider Notes (Signed)
[facility] Emergency Department Provider Note   ____________________________________________   First MD Initiated Contact with Patient 10/31/18 1318     (approximate)  I have reviewed the triage vital signs and the nursing notes.   HISTORY  Chief Complaint Fever and Nasal Congestion    HPI Krista Mcgee is a 43 y.o. female presents to the ED with complaint of fever, nasal and chest congestion that started 4 days ago.  Patient also has a cough with her low-grade fever.  Patient has an albuterol and Symbicort inhaler which she uses.  She denies a history of asthma but states that she has lung issues due to her obesity.  Patient denies any exposure to influenza.  She denies any nausea, vomiting, diarrhea or body aches.      Past Medical History:  Diagnosis Date  . Anxiety   . Depression   . Diabetes mellitus without complication ([place])   . Hypertension     Patient Active Problem List   Diagnosis Date Noted  . Inappropriately high serum insulin 10/25/2018  . Leukocytosis 10/17/2018  . Fluttering heart 07/10/2018  . Abnormal EKG 07/10/2018  . Hepatic steatosis 04/18/2018  . Diverticulosis 04/18/2018  . Lumbar disc disease with radiculopathy 03/03/2018  . Knee pain, bilateral 03/03/2018  . Hip pain, chronic, right 03/03/2018  . Low HDL (under 40) 03/29/2017  . High triglycerides 03/29/2017  . Type 2 diabetes mellitus ([place]) 03/19/2017  . Hirsutism 03/19/2017  . Pap smear for cervical cancer screening 03/19/2017  . Vitamin D deficiency 03/10/2017  . Shortness of breath 03/09/2017  . Medication monitoring encounter 03/09/2017  . Morbid obesity with BMI of 50.0-59.9, adult ([place]) 07/29/2015  . Insomnia 07/29/2015  . Major depressive disorder, recurrent, in full remission with anxious distress ([place]) 07/29/2015  . Snoring 07/29/2015  . Encounter for cholesteral screening for cardiovascular disease 07/29/2015  . Other fatigue 07/29/2015    Past  Surgical History:  Procedure Laterality Date  . CHOLECYSTECTOMY    . OOPHORECTOMY      Prior to Admission medications   Medication Sig Start Date End Date Taking? Authorizing Provider  ABILIFY 5 MG tablet TAKE 1 AND 1/2 TABLETS BY MOUTH AT BEDTIME 05/26/15   [provider]  albuterol (PROVENTIL HFA;VENTOLIN HFA) 108 (90 Base) MCG/ACT inhaler Inhale 1-2 puffs into the lungs every 4 (four) hours as needed for wheezing or shortness of breath. 04/12/18   Wilhelmina Mcardle, MD  ALPRAZolam Duanne Moron) 1 MG tablet TAKE 1 TABLET BY MOUTH 3 TIMES A DAY AS NEEDED FOR ANXIETY 06/26/15   [provider]  amitriptyline (ELAVIL) 25 MG tablet TAKE 1 TABLET BY MOUTH EVERYDAY AT BEDTIME 05/16/18   [provider]  atorvastatin (LIPITOR) 10 MG tablet TAKE 1 TABLET BY MOUTH EVERYDAY AT BEDTIME 05/29/18   Lada, Satira Anis, MD  brompheniramine-pseudoephedrine-DM 30-2-10 MG/5ML syrup Take 5 mLs by mouth 4 (four) times daily as needed. 10/31/18   Johnn Hai, PA-C  budesonide-formoterol [facility]) 160-4.5 MCG/ACT inhaler Inhale 2 puffs into the lungs 2 (two) times daily. 01/19/18   Arnetha Courser, MD  buPROPion (WELLBUTRIN XL) 300 MG 24 hr tablet Take 300 mg by mouth every morning. 05/24/18   [provider]  empagliflozin (JARDIANCE) 10 MG TABS tablet Take 10 mg by mouth daily. 01/19/18   Arnetha Courser, MD  metFORMIN (GLUCOPHAGE-XR) 500 MG 24 hr tablet Take 2 tablets (1,000 mg total) by mouth daily. 03/03/18   Arnetha Courser, MD  predniSONE (DELTASONE) 10 MG tablet Take 3 tablets once a day for 4 days 10/31/18   Letitia Neri L, PA-C  SAPHRIS 10 MG SUBL Take 10 mg by mouth at bedtime. 02/21/17   [provider]  sulfamethoxazole-trimethoprim (BACTRIM DS,SEPTRA DS) 800-160 MG tablet Take 1 tablet by mouth 2 (two) times daily. 10/31/18   Johnn Hai, PA-C  venlafaxine XR (EFFEXOR-XR) 75 MG 24 hr capsule Take 225 mg by mouth daily. 06/18/15   [provider]  Vitamin  D, Ergocalciferol, (DRISDOL) 1.25 MG (50000 UT) CAPS capsule Take 1 capsule (50,000 Units total) by mouth every 7 (seven) days. 10/18/18   Arnetha Courser, MD    Allergies Patient has no known allergies.  Family History  Problem Relation Age of Onset  . Cancer Mother        ovarian  . Other Father        tumor in his ear drum  . Diabetes Father   . Heart disease Father   . Hyperlipidemia Father   . Hypertension Father   . Kidney disease Daughter        hydronephrosis  . Arthritis Paternal Grandmother   . COPD Paternal Grandmother   . Emphysema Paternal Grandmother   . Heart disease Paternal Grandmother   . Cancer Paternal Grandfather        skin  . COPD Paternal Grandfather   . Emphysema Paternal Grandfather     Social History Social History   Tobacco Use  . Smoking status: Former Research scientist (life sciences)  . Smokeless tobacco: Never Used  . Tobacco comment: has not smoked in about 75yrs.  Substance Use Topics  . Alcohol use: Yes    Alcohol/week: 0.0 standard drinks    Comment: occasionally  . Drug use: No    Review of Systems Constitutional: Subjective fever/chills Eyes: No visual changes. ENT: No sore throat. Cardiovascular: Denies chest pain. Respiratory: Denies shortness of breath.  Positive cough. Gastrointestinal: No abdominal pain.  No nausea, no vomiting.  No diarrhea.  Musculoskeletal: Negative for back pain.  Negative for body aches. Skin: Negative for rash. Neurological: Negative for headaches, focal weakness or numbness. Endocrine:  Positive for diabetes ____________________________________________   PHYSICAL EXAM:  VITAL SIGNS: ED Triage Vitals  Enc Vitals Group     BP 10/31/18 1243 105/69     Pulse Rate 10/31/18 1243 (!) 105     Resp --      Temp 10/31/18 1243 (!) 100.9 F (38.3 C)     Temp Source 10/31/18 1243 Oral     SpO2 10/31/18 1243 92 %     Weight 10/31/18 1243 (!) 365 lb (165.6 kg)     Height 10/31/18 1243 5\' 6"  (1.676 m)     Head Circumference  --      Peak Flow --      Pain Score 10/31/18 1246 0     Pain Loc --      Pain Edu? --      Excl. in [place]? --     Constitutional: Alert and oriented. Well appearing and in no acute distress.  Morbidly obese. Eyes: Conjunctivae are normal.  Head: Atraumatic. Nose: Minimal congestion/rhinnorhea.  TMs are dull.  Canal is partially occluded with cerumen. Mouth/Throat: Mucous membranes are moist.  Oropharynx non-erythematous.  No exudate and uvula is midline. Neck: No stridor.   Hematological/Lymphatic/Immunilogical: No cervical lymphadenopathy. Cardiovascular: Normal rate, regular rhythm. Grossly normal heart sounds.  Good peripheral circulation. Respiratory: Normal respiratory effort.  No retractions. Lungs  CTAB. Gastrointestinal: Soft and nontender. No distention.  Musculoskeletal: Moves upper and lower extremities without any difficulty and normal gait was noted. Neurologic:  Normal speech and language. No gross focal neurologic deficits are appreciated.  Skin:  Skin is warm, dry and intact. No rash noted. Psychiatric: Mood and affect are normal. Speech and behavior are normal.  ____________________________________________   LABS (all labs ordered are listed, but only abnormal results are displayed)  Labs Reviewed - No data to display ____________________________________________  RADIOLOGY  Official radiology report(s): Dg Chest 2 View  Result Date: 10/31/2018 CLINICAL DATA:  Wheezing, shortness of breath, and fever. EXAM: CHEST - 2 VIEW COMPARISON:  12/22/2016 FINDINGS: The heart size and mediastinal contours are within normal limits. Stable bilateral pleural-parenchymal scarring. No evidence of acute pulmonary infiltrate or edema. No evidence of pleural effusion. The visualized skeletal structures are unremarkable. IMPRESSION: Stable bilateral pleural-parenchymal scarring. No active cardiopulmonary disease. Electronically Signed   By: Earle Gell M.D.   On: 10/31/2018 14:57     ____________________________________________   PROCEDURES  Procedure(s) performed (including Critical Care):  Procedures   ____________________________________________   INITIAL IMPRESSION / ASSESSMENT AND PLAN / ED COURSE  As part of my medical decision making, I reviewed the following data within the electronic MEDICAL RECORD NUMBER Notes from prior ED visits and [place] Controlled Substance Database  Patient presents to the ED with complaint of 4 days fever, cough, and congestion.  There is been no exposure to influenza and she denies any muscle aches, vomiting or diarrhea.  Patient gives a history of respiratory issues in which she is already been prescribed albuterol and Symbicort.  There is some mild expiratory wheezes heard on physical exam.  Chest x-ray is negative for pneumonia but shows stable bilateral scarring.  Patient was made aware.  She improved with a DuoNeb treatment while in the ED.  She is agreeable with the medications that were sent to her pharmacy.  She also is encouraged to follow-up with her PCP by the end of the week and sooner if needed.  She also was made aware to return to the emergency department if any severe worsening of her symptoms.  ____________________________________________   FINAL CLINICAL IMPRESSION(S) / ED DIAGNOSES  Final diagnoses:  Viral URI with cough  Expiratory wheezing     ED Discharge Orders         Ordered    predniSONE (DELTASONE) 10 MG tablet     10/31/18 1554    brompheniramine-pseudoephedrine-DM 30-2-10 MG/5ML syrup  4 times daily PRN     10/31/18 1554    sulfamethoxazole-trimethoprim (BACTRIM DS,SEPTRA DS) 800-160 MG tablet  2 times daily     10/31/18 1554           Note:  This document was prepared using Dragon voice recognition software and may include unintentional dictation errors.    Johnn Hai, PA-C 10/31/18 1609    Earleen Newport, MD 10/31/18 [phone]

## 2018-11-29 ENCOUNTER — Other Ambulatory Visit: Payer: Self-pay | Admitting: Family Medicine

## 2018-11-29 DIAGNOSIS — E119 Type 2 diabetes mellitus without complications: Secondary | ICD-10-CM

## 2019-02-07 ENCOUNTER — Other Ambulatory Visit: Payer: Self-pay

## 2019-02-07 ENCOUNTER — Emergency Department
Admission: EM | Admit: 2019-02-07 | Discharge: 2019-02-08 | Disposition: A | Discharge status: Home / Self Care | Payer: Medicaid Other | Attending: Emergency Medicine | Admitting: Emergency Medicine

## 2019-02-07 ENCOUNTER — Encounter: Payer: Self-pay | Admitting: Emergency Medicine

## 2019-02-07 DIAGNOSIS — R109 Unspecified abdominal pain: Secondary | ICD-10-CM | POA: Diagnosis present

## 2019-02-07 DIAGNOSIS — Z79899 Other long term (current) drug therapy: Secondary | ICD-10-CM | POA: Insufficient documentation

## 2019-02-07 DIAGNOSIS — R112 Nausea with vomiting, unspecified: Secondary | ICD-10-CM | POA: Diagnosis not present

## 2019-02-07 DIAGNOSIS — I10 Essential (primary) hypertension: Secondary | ICD-10-CM | POA: Diagnosis not present

## 2019-02-07 DIAGNOSIS — Z7984 Long term (current) use of oral hypoglycemic drugs: Secondary | ICD-10-CM | POA: Diagnosis not present

## 2019-02-07 DIAGNOSIS — K5289 Other specified noninfective gastroenteritis and colitis: Secondary | ICD-10-CM | POA: Diagnosis not present

## 2019-02-07 DIAGNOSIS — E119 Type 2 diabetes mellitus without complications: Secondary | ICD-10-CM | POA: Insufficient documentation

## 2019-02-07 DIAGNOSIS — K529 Noninfective gastroenteritis and colitis, unspecified: Secondary | ICD-10-CM

## 2019-02-07 MED ORDER — SODIUM CHLORIDE 0.9 % IV BOLUS
1000.0000 mL | Freq: Once | INTRAVENOUS | Status: AC
  Administered 2019-02-08: 1000 mL via INTRAVENOUS

## 2019-02-07 MED ORDER — MORPHINE SULFATE (PF) 4 MG/ML IV SOLN
4.0000 mg | Freq: Once | INTRAVENOUS | Status: AC
  Administered 2019-02-08: 4 mg via INTRAVENOUS
  Filled 2019-02-07: qty 1

## 2019-02-07 MED ORDER — SODIUM CHLORIDE 0.9% FLUSH: 3.0000 mL | Freq: Once | INTRAVENOUS | Status: DC

## 2019-02-07 MED ORDER — IOHEXOL 240 MG/ML SOLN
50.0000 mL | Freq: Once | INTRAMUSCULAR | Status: AC | PRN
  Administered 2019-02-07: 50 mL via ORAL

## 2019-02-07 MED ORDER — ONDANSETRON HCL 4 MG/2ML IJ SOLN
4.0000 mg | Freq: Once | INTRAMUSCULAR | Status: AC
  Administered 2019-02-08: 4 mg via INTRAVENOUS
  Filled 2019-02-07: qty 2

## 2019-02-07 NOTE — ED Provider Notes (Signed)
[facility] Emergency Department Provider Note  Time seen: 11:40 PM  I have reviewed the triage vital signs and the nursing notes.   HISTORY  Chief Complaint Abdominal Pain and Emesis   HPI Krista Mcgee is a 43 y.o. female with a past medical history anxiety, depression, diabetes, hypertension, presents to the emergency department for abdominal pain nausea vomiting.  According to the patient around 4 5:00 tonight she began feeling some discomfort in her abdomen which she describes as "hunger pains."  States she attempted to eat and then shortly afterwards became very nauseated with significantly worsening pain diffusely across the mid abdomen.  Has vomited multiple times.  Patient denies any lower abdominal pain.  Denies any diarrhea, last bowel move around 2 PM today.  Denies any dysuria or hematuria.  No fever cough congestion or shortness of breath.  Patient is status post cholecystectomy.   Past Medical History:  Diagnosis Date  . Anxiety   . Depression   . Diabetes mellitus without complication ([place])   . Hypertension     Patient Active Problem List   Diagnosis Date Noted  . Inappropriately high serum insulin 10/25/2018  . Leukocytosis 10/17/2018  . Fluttering heart 07/10/2018  . Abnormal EKG 07/10/2018  . Hepatic steatosis 04/18/2018  . Diverticulosis 04/18/2018  . Lumbar disc disease with radiculopathy 03/03/2018  . Knee pain, bilateral 03/03/2018  . Hip pain, chronic, right 03/03/2018  . Low HDL (under 40) 03/29/2017  . High triglycerides 03/29/2017  . Type 2 diabetes mellitus ([place]) 03/19/2017  . Hirsutism 03/19/2017  . Pap smear for cervical cancer screening 03/19/2017  . Vitamin D deficiency 03/10/2017  . Shortness of breath 03/09/2017  . Medication monitoring encounter 03/09/2017  . Morbid obesity with BMI of 50.0-59.9, adult ([place]) 07/29/2015  . Insomnia 07/29/2015  . Major depressive disorder, recurrent, in full remission with anxious  distress ([place]) 07/29/2015  . Snoring 07/29/2015  . Encounter for cholesteral screening for cardiovascular disease 07/29/2015  . Other fatigue 07/29/2015    Past Surgical History:  Procedure Laterality Date  . CHOLECYSTECTOMY    . INCONTINENCE SURGERY    . OOPHORECTOMY    . TUBAL LIGATION      Prior to Admission medications   Medication Sig Start Date End Date Taking? Authorizing Provider  ABILIFY 5 MG tablet TAKE 1 AND 1/2 TABLETS BY MOUTH AT BEDTIME 05/26/15   [provider]  albuterol (PROVENTIL HFA;VENTOLIN HFA) 108 (90 Base) MCG/ACT inhaler Inhale 1-2 puffs into the lungs every 4 (four) hours as needed for wheezing or shortness of breath. 04/12/18   Wilhelmina Mcardle, MD  ALPRAZolam Duanne Moron) 1 MG tablet TAKE 1 TABLET BY MOUTH 3 TIMES A DAY AS NEEDED FOR ANXIETY 06/26/15   [provider]  amitriptyline (ELAVIL) 25 MG tablet TAKE 1 TABLET BY MOUTH EVERYDAY AT BEDTIME 05/16/18   [provider]  atorvastatin (LIPITOR) 10 MG tablet TAKE 1 TABLET BY MOUTH EVERYDAY AT BEDTIME 05/29/18   Lada, Satira Anis, MD  brompheniramine-pseudoephedrine-DM 30-2-10 MG/5ML syrup Take 5 mLs by mouth 4 (four) times daily as needed. 10/31/18   Johnn Hai, PA-C  budesonide-formoterol [facility]) 160-4.5 MCG/ACT inhaler Inhale 2 puffs into the lungs 2 (two) times daily. 01/19/18   Arnetha Courser, MD  buPROPion (WELLBUTRIN XL) 300 MG 24 hr tablet Take 300 mg by mouth every morning. 05/24/18   [provider]  JARDIANCE 10 MG TABS tablet TAKE 1 TABLET BY MOUTH DAILY 11/30/18   Lada,  Satira Anis, MD  metFORMIN (GLUCOPHAGE-XR) 500 MG 24 hr tablet Take 2 tablets (1,000 mg total) by mouth daily. 03/03/18   Arnetha Courser, MD  predniSONE (DELTASONE) 10 MG tablet Take 3 tablets once a day for 4 days 10/31/18   Letitia Neri L, PA-C  SAPHRIS 10 MG SUBL Take 10 mg by mouth at bedtime. 02/21/17   [provider]  sulfamethoxazole-trimethoprim (BACTRIM DS,SEPTRA DS) 800-160 MG tablet  Take 1 tablet by mouth 2 (two) times daily. 10/31/18   Johnn Hai, PA-C  venlafaxine XR (EFFEXOR-XR) 75 MG 24 hr capsule Take 225 mg by mouth daily. 06/18/15   [provider]  Vitamin D, Ergocalciferol, (DRISDOL) 1.25 MG (50000 UT) CAPS capsule Take 1 capsule (50,000 Units total) by mouth every 7 (seven) days. 10/18/18   Arnetha Courser, MD    No Known Allergies  Family History  Problem Relation Age of Onset  . Cancer Mother        ovarian  . Other Father        tumor in his ear drum  . Diabetes Father   . Heart disease Father   . Hyperlipidemia Father   . Hypertension Father   . Kidney disease Daughter        hydronephrosis  . Arthritis Paternal Grandmother   . COPD Paternal Grandmother   . Emphysema Paternal Grandmother   . Heart disease Paternal Grandmother   . Cancer Paternal Grandfather        skin  . COPD Paternal Grandfather   . Emphysema Paternal Grandfather     Social History Social History   Tobacco Use  . Smoking status: Current Every Day Smoker  . Smokeless tobacco: Never Used  . Tobacco comment: has not smoked in about 63yrs.  Substance Use Topics  . Alcohol use: Yes    Alcohol/week: 0.0 standard drinks    Comment: occasionally  . Drug use: No    Review of Systems Constitutional: Negative for fever. Cardiovascular: Negative for chest pain. Respiratory: Negative for shortness of breath. Gastrointestinal: Generalized abdominal pain.  Positive for nausea and vomiting.  Normal bowel movement 2 PM. Genitourinary: Negative for urinary compaints Musculoskeletal: Negative for musculoskeletal complaints Skin: Negative for skin complaints  Neurological: Negative for headache All other ROS negative  ____________________________________________   PHYSICAL EXAM:  VITAL SIGNS: ED Triage Vitals  Enc Vitals Group     BP 02/07/19 2318 (!) 144/109     Pulse Rate 02/07/19 2318 95     Resp 02/07/19 2318 20     Temp 02/07/19 2318 98.2 F (36.8  C)     Temp Source 02/07/19 2318 Oral     SpO2 02/07/19 2318 95 %     Weight 02/07/19 2320 (!) 380 lb (172.4 kg)     Height 02/07/19 2320 5\' 7"  (1.702 m)     Head Circumference --      Peak Flow --      Pain Score 02/07/19 2319 10     Pain Loc --      Pain Edu? --      Excl. in [place]? --    Constitutional: Alert and oriented. Well appearing and in no distress. Eyes: Normal exam ENT      Head: Normocephalic and atraumatic      Mouth/Throat: Mucous membranes are moist. Cardiovascular: Normal rate, regular rhythm. No murmur Respiratory: Normal respiratory effort without tachypnea nor retractions. Breath sounds are clear  Gastrointestinal: Soft and nontender. No distention.  Musculoskeletal: Nontender with normal range of motion in all extremities.  Neurologic:  Normal speech and language. No gross focal neurologic deficits  Skin:  Skin is warm, dry and intact.  Psychiatric: Mood and affect are normal.  ____________________________________________    RADIOLOGY  CT scan shows mild mesenteric edema in the lower abdomen, possible enteritis versus colitis.    ____________________________________________   INITIAL IMPRESSION / ASSESSMENT AND PLAN / ED COURSE  Pertinent labs & imaging results that were available during my care of the patient were reviewed by me and considered in my medical decision making (see chart for details).   Patient presents emergency department for abdominal pain, acute onset around 4 PM.  Differential is quite broad, would include pancreatitis, gastritis, gastroenteritis, colitis, diverticulitis, enteritis.  We will check labs, treat pain nausea, IV hydrate proceed with CT imaging.  Patient agreeable to plan of care.  Possible enteritis/colitis on CT.  No other acute abnormalities noted.  Patient's lab work is largely within normal limits besides a leukocytosis of 17,000.  Overall the patient appears well.  We will discharge home with PCP follow-up.  Patient  agreeable to plan of care.  Urinalysis negative.  We will discharge with Norco, Zofran and Augmentin.  Patient will follow-up with her doctor.  I discussed return precautions.  Krista Mcgee was evaluated in Emergency Department on 02/07/2019 for the symptoms described in the history of present illness. She was evaluated in the context of the global COVID-19 pandemic, which necessitated consideration that the patient might be at risk for infection with the SARS-CoV-2 virus that causes COVID-19. Institutional protocols and algorithms that pertain to the evaluation of patients at risk for COVID-19 are in a state of rapid change based on information released by regulatory bodies including the CDC and federal and state organizations. These policies and algorithms were followed during the patient's care in the ED.  ____________________________________________   FINAL CLINICAL IMPRESSION(S) / ED DIAGNOSES  Nausea vomiting Abdominal pain Enteritis   Harvest Dark, MD 02/08/19 [phone]

## 2019-02-07 NOTE — ED Triage Notes (Addendum)
Pt presents to ED with generalized abd pain/cramping that started around 1800 with vomiting X2. Pt states since around 2000 tonight she gets a wave of sharp pain every few minutes. Pt actively vomiting and tearful in triage. Pt reports her abd is swollen. Denies similar symptoms previously.

## 2019-02-08 ENCOUNTER — Emergency Department: Payer: Medicaid Other

## 2019-02-08 LAB — URINALYSIS, COMPLETE (UACMP) WITH MICROSCOPIC
Bacteria, UA: NONE SEEN
Bilirubin Urine: NEGATIVE
Glucose, UA: NEGATIVE mg/dL
Hgb urine dipstick: NEGATIVE
Ketones, ur: NEGATIVE mg/dL
Nitrite: NEGATIVE
Protein, ur: NEGATIVE mg/dL
Specific Gravity, Urine: 1.045 — ABNORMAL HIGH (ref 1.005–1.030)
pH: 5 (ref 5.0–8.0)

## 2019-02-08 LAB — COMPREHENSIVE METABOLIC PANEL
ALT: 19 U/L (ref 0–44)
AST: 22 U/L (ref 15–41)
Albumin: 3.9 g/dL (ref 3.5–5.0)
Alkaline Phosphatase: 73 U/L (ref 38–126)
Anion gap: 11 (ref 5–15)
BUN: 20 mg/dL (ref 6–20)
CO2: 26 mmol/L (ref 22–32)
Calcium: 9.1 mg/dL (ref 8.9–10.3)
Chloride: 100 mmol/L (ref 98–111)
Creatinine, Ser: 0.88 mg/dL (ref 0.44–1.00)
GFR calc Af Amer: >60 mL/min (ref >60)
GFR calc non Af Amer: >60 mL/min (ref >60)
Glucose, Bld: 137 mg/dL — ABNORMAL HIGH (ref 70–99)
Potassium: 3.9 mmol/L (ref 3.5–5.1)
Sodium: 137 mmol/L (ref 135–145)
Total Bilirubin: 1 mg/dL (ref 0.3–1.2)
Total Protein: 8 g/dL (ref 6.5–8.1)

## 2019-02-08 LAB — CBC
HCT: 44.1 % (ref 36.0–46.0)
Hemoglobin: 14.1 g/dL (ref 12.0–15.0)
MCH: 27.2 pg (ref 26.0–34.0)
MCHC: 32 g/dL (ref 30.0–36.0)
MCV: 85 fL (ref 80.0–100.0)
Platelets: 323 10*3/uL (ref 150–400)
RBC: 5.19 MIL/uL — ABNORMAL HIGH (ref 3.87–5.11)
RDW: 15.1 % (ref 11.5–15.5)
WBC: 17.6 10*3/uL — ABNORMAL HIGH (ref 4.0–10.5)
nRBC: 0 % (ref 0.0–0.2)

## 2019-02-08 LAB — POCT PREGNANCY, URINE: Preg Test, Ur: NEGATIVE

## 2019-02-08 LAB — LIPASE, BLOOD: Lipase: 34 U/L (ref 11–51)

## 2019-02-08 MED ORDER — ONDANSETRON 4 MG PO TBDP: 4.0000 mg | ORAL_TABLET | Freq: Three times a day (TID) | ORAL | 0 refills | Status: DC | PRN

## 2019-02-08 MED ORDER — MORPHINE SULFATE (PF) 4 MG/ML IV SOLN
4.0000 mg | Freq: Once | INTRAVENOUS | Status: AC
  Administered 2019-02-08: 4 mg via INTRAVENOUS
  Filled 2019-02-08: qty 1

## 2019-02-08 MED ORDER — HYDROCODONE-ACETAMINOPHEN 5-325 MG PO TABS: 1.0000 | ORAL_TABLET | ORAL | 0 refills | Status: DC | PRN

## 2019-02-08 MED ORDER — IOHEXOL 300 MG/ML  SOLN
125.0000 mL | Freq: Once | INTRAMUSCULAR | Status: AC | PRN
  Administered 2019-02-08: 125 mL via INTRAVENOUS

## 2019-02-08 MED ORDER — AMOXICILLIN-POT CLAVULANATE 875-125 MG PO TABS
1.0000 | ORAL_TABLET | Freq: Once | ORAL | Status: AC
  Administered 2019-02-08: 1 via ORAL
  Filled 2019-02-08: qty 1

## 2019-02-08 MED ORDER — AMOXICILLIN-POT CLAVULANATE 875-125 MG PO TABS: 1.0000 | ORAL_TABLET | Freq: Two times a day (BID) | ORAL | 0 refills | Status: AC

## 2019-02-08 MED ORDER — ONDANSETRON HCL 4 MG/2ML IJ SOLN
4.0000 mg | Freq: Once | INTRAMUSCULAR | Status: AC
  Administered 2019-02-08: 4 mg via INTRAVENOUS
  Filled 2019-02-08: qty 2

## 2019-02-08 NOTE — ED Notes (Addendum)
Pt sitting uprite on bed, appears uncomfortable, tearful & grimacing; reports generalized crampy/sharp abd pain this evening accomp by N/V; denies hx of same, denies any urinary or bowel complaints; resp even/unlab, lungs clear, apical audible & regular; +BS, abd soft/nondist with diffuse discomfort on palpation; card monitor in place; pt voices understanding of plan of care and need for cc urine sample

## 2019-02-08 NOTE — ED Notes (Signed)
Pt dozing since admin of pain med; has only drank few swallows of contrast despite instructions; Dr Kerman Passey notified and per MD will proceed with CT without PO contrast; CT tech notified

## 2019-02-08 NOTE — ED Notes (Signed)
CT tech to bedside with PO contrast....allergies reviewed with patient...instructions for administration of PO contrast reviewed with patient -- patient verbalizes understanding of process. RN to f/u with and encourage patient to consume PO contrast volume. Patient to notify RN when volume completed and for any difficulties experienced while drinking --Patient verbalizes understanding  

## 2019-02-08 NOTE — ED Notes (Signed)
Pt reports returning pain and nausea 8/10; Dr Kerman Passey at bedside

## 2020-07-03 ENCOUNTER — Other Ambulatory Visit: Payer: Self-pay

## 2020-07-03 ENCOUNTER — Ambulatory Visit: Payer: Self-pay | Admitting: *Deleted

## 2020-07-03 ENCOUNTER — Encounter: Payer: Self-pay | Admitting: Emergency Medicine

## 2020-07-03 ENCOUNTER — Emergency Department
Admission: EM | Admit: 2020-07-03 | Discharge: 2020-07-03 | Disposition: A | Discharge status: Home / Self Care | Payer: Medicaid Other | Attending: Emergency Medicine | Admitting: Emergency Medicine

## 2020-07-03 DIAGNOSIS — Z79899 Other long term (current) drug therapy: Secondary | ICD-10-CM | POA: Insufficient documentation

## 2020-07-03 DIAGNOSIS — Z7984 Long term (current) use of oral hypoglycemic drugs: Secondary | ICD-10-CM | POA: Insufficient documentation

## 2020-07-03 DIAGNOSIS — R1084 Generalized abdominal pain: Secondary | ICD-10-CM | POA: Diagnosis present

## 2020-07-03 DIAGNOSIS — F17200 Nicotine dependence, unspecified, uncomplicated: Secondary | ICD-10-CM | POA: Insufficient documentation

## 2020-07-03 DIAGNOSIS — E860 Dehydration: Secondary | ICD-10-CM | POA: Insufficient documentation

## 2020-07-03 DIAGNOSIS — E1169 Type 2 diabetes mellitus with other specified complication: Secondary | ICD-10-CM | POA: Insufficient documentation

## 2020-07-03 DIAGNOSIS — I10 Essential (primary) hypertension: Secondary | ICD-10-CM | POA: Diagnosis not present

## 2020-07-03 DIAGNOSIS — Z20822 Contact with and (suspected) exposure to covid-19: Secondary | ICD-10-CM | POA: Insufficient documentation

## 2020-07-03 DIAGNOSIS — E669 Obesity, unspecified: Secondary | ICD-10-CM | POA: Insufficient documentation

## 2020-07-03 DIAGNOSIS — A084 Viral intestinal infection, unspecified: Secondary | ICD-10-CM | POA: Diagnosis not present

## 2020-07-03 LAB — COMPREHENSIVE METABOLIC PANEL
ALT: 22 U/L (ref 0–44)
AST: 21 U/L (ref 15–41)
Albumin: 3.5 g/dL (ref 3.5–5.0)
Alkaline Phosphatase: 63 U/L (ref 38–126)
Anion gap: 12 (ref 5–15)
BUN: 12 mg/dL (ref 6–20)
CO2: 23 mmol/L (ref 22–32)
Calcium: 8.5 mg/dL — ABNORMAL LOW (ref 8.9–10.3)
Chloride: 101 mmol/L (ref 98–111)
Creatinine, Ser: 0.7 mg/dL (ref 0.44–1.00)
GFR, Estimated: >60 mL/min (ref >60)
Glucose, Bld: 135 mg/dL — ABNORMAL HIGH (ref 70–99)
Potassium: 4 mmol/L (ref 3.5–5.1)
Sodium: 136 mmol/L (ref 135–145)
Total Bilirubin: 1.3 mg/dL — ABNORMAL HIGH (ref 0.3–1.2)
Total Protein: 7.4 g/dL (ref 6.5–8.1)

## 2020-07-03 LAB — CBC
HCT: 44.5 % (ref 36.0–46.0)
Hemoglobin: 13.9 g/dL (ref 12.0–15.0)
MCH: 27.7 pg (ref 26.0–34.0)
MCHC: 31.2 g/dL (ref 30.0–36.0)
MCV: 88.6 fL (ref 80.0–100.0)
Platelets: 157 10*3/uL (ref 150–400)
RBC: 5.02 MIL/uL (ref 3.87–5.11)
RDW: 15 % (ref 11.5–15.5)
WBC: 12.6 10*3/uL — ABNORMAL HIGH (ref 4.0–10.5)
nRBC: 0 % (ref 0.0–0.2)

## 2020-07-03 LAB — RESPIRATORY PANEL BY RT PCR (FLU A&B, COVID)
Influenza A by PCR: NEGATIVE
Influenza B by PCR: NEGATIVE
SARS Coronavirus 2 by RT PCR: NEGATIVE

## 2020-07-03 LAB — LIPASE, BLOOD: Lipase: 20 U/L (ref 11–51)

## 2020-07-03 MED ORDER — ONDANSETRON 4 MG PO TBDP: 4.0000 mg | ORAL_TABLET | Freq: Three times a day (TID) | ORAL | 0 refills | Status: DC | PRN

## 2020-07-03 MED ORDER — PANTOPRAZOLE SODIUM 40 MG IV SOLR
40.0000 mg | Freq: Once | INTRAVENOUS | Status: AC
  Administered 2020-07-03: 40 mg via INTRAVENOUS

## 2020-07-03 MED ORDER — DICYCLOMINE HCL 10 MG PO CAPS: 10.0000 mg | ORAL_CAPSULE | Freq: Four times a day (QID) | ORAL | 0 refills | Status: DC | PRN

## 2020-07-03 MED ORDER — LACTATED RINGERS IV BOLUS
1000.0000 mL | Freq: Once | INTRAVENOUS | Status: AC
  Administered 2020-07-03: 1000 mL via INTRAVENOUS

## 2020-07-03 MED ORDER — DICYCLOMINE HCL 10 MG/ML IM SOLN
20.0000 mg | Freq: Once | INTRAMUSCULAR | Status: AC
  Administered 2020-07-03: 20 mg via INTRAMUSCULAR
  Filled 2020-07-03: qty 2

## 2020-07-03 MED ORDER — NAPROXEN 500 MG PO TABS: 500.0000 mg | ORAL_TABLET | Freq: Two times a day (BID) | ORAL | 0 refills | Status: DC

## 2020-07-03 MED ORDER — KETOROLAC TROMETHAMINE 30 MG/ML IJ SOLN
15.0000 mg | INTRAMUSCULAR | Status: AC
  Administered 2020-07-03: 15 mg via INTRAVENOUS
  Filled 2020-07-03: qty 1

## 2020-07-03 MED ORDER — FAMOTIDINE 20 MG PO TABS: 20.0000 mg | ORAL_TABLET | Freq: Two times a day (BID) | ORAL | 0 refills | Status: DC

## 2020-07-03 NOTE — ED Triage Notes (Signed)
First Nurse Note:  C/O abdominal and chest pain.

## 2020-07-03 NOTE — Discharge Instructions (Signed)
Your lab tests were all okay today. Please use medications as prescribed for stomach upset and cramping pain and focus on staying well-hydrated for the next few days while this illness runs its course.

## 2020-07-03 NOTE — Telephone Encounter (Signed)
Last seen here 10/17/18

## 2020-07-03 NOTE — Telephone Encounter (Signed)
Yesterday patient had stomach pain and diarrhea- vomiting. Pressure in chest.Patient states her symptoms are not getting better- advised ED for evaluation  Reason for Disposition . [1] Chest pain lasting < 5 minutes AND [2] NO chest pain or cardiac symptoms (e.g., breathing difficulty, sweating) now (Exception: chest pains that last only a few seconds) . [1] SEVERE pain (e.g., excruciating) AND [2] present > 1 hour  Answer Assessment - Initial Assessment Questions 1. LOCATION: "Where does it hurt?"       Lower part of chest between breast 2. RADIATION: "Does the pain go anywhere else?" (e.g., into neck, jaw, arms, back)     pressure 3. ONSET: "When did the chest pain begin?" (Minutes, hours or days)      Late last night/early am 4. PATTERN "Does the pain come and go, or has it been constant since it started?"  "Does it get worse with exertion?"      Comes and goes with stomach pain, not worse with exertion 5. DURATION: "How long does it last" (e.g., seconds, minutes, hours)     minute 6. SEVERITY: "How bad is the pain?"  (e.g., Scale 1-10; mild, moderate, or severe)    - MILD (1-3): doesn't interfere with normal activities     - MODERATE (4-7): interferes with normal activities or awakens from sleep    - SEVERE (8-10): excruciating pain, unable to do any normal activities       moderate 7. CARDIAC RISK FACTORS: "Do you have any history of heart problems or risk factors for heart disease?" (e.g., angina, prior heart attack; diabetes, high blood pressure, high cholesterol, smoker, or strong family history of heart disease)     High BP 8. PULMONARY RISK FACTORS: "Do you have any history of lung disease?"  (e.g., blood clots in lung, asthma, emphysema, birth control pills)     no 9. CAUSE: "What do you think is causing the chest pain?"     abodminal pain 10. OTHER SYMPTOMS: "Do you have any other symptoms?" (e.g., dizziness, nausea, vomiting, sweating, fever, difficulty breathing, cough)        Diarrhea, vomitting 11. PREGNANCY: "Is there any chance you are pregnant?" "When was your last menstrual period?"       n/a  Answer Assessment - Initial Assessment Questions 1. LOCATION: "Where does it hurt?"      Upper pain-midline to bellybutton 2. RADIATION: "Does the pain shoot anywhere else?" (e.g., chest, back)     chest 3. ONSET: "When did the pain begin?" (e.g., minutes, hours or days ago)      yesterday 4. SUDDEN: "Gradual or sudden onset?"     gradual 5. PATTERN "Does the pain come and go, or is it constant?"    - If constant: "Is it getting better, staying the same, or worsening?"      (Note: Constant means the pain never goes away completely; most serious pain is constant and it progresses)     - If intermittent: "How long does it last?" "Do you have pain now?"     (Note: Intermittent means the pain goes away completely between bouts)     Constant- yes 6. SEVERITY: "How bad is the pain?"  (e.g., Scale 1-10; mild, moderate, or severe)    - MILD (1-3): doesn't interfere with normal activities, abdomen soft and not tender to touch     - MODERATE (4-7): interferes with normal activities or awakens from sleep, tender to touch     - SEVERE (8-10): excruciating pain,  doubled over, unable to do any normal activities       severe 7. RECURRENT SYMPTOM: "Have you ever had this type of stomach pain before?" If Yes, ask: "When was the last time?" and "What happened that time?"      no 8. AGGRAVATING FACTORS: "Does anything seem to cause this pain?" (e.g., foods, stress, alcohol)     no 9. CARDIAC SYMPTOMS: "Do you have any of the following symptoms: chest pain, difficulty breathing, sweating, nausea?"     Chest pain, nausea 10. OTHER SYMPTOMS: "Do you have any other symptoms?" (e.g., fever, vomiting, diarrhea)       Diarrhea, vomiting 11. PREGNANCY: "Is there any chance you are pregnant?" "When was your last menstrual period?"       n/a  Protocols used: CHEST PAIN-A-AH, ABDOMINAL  PAIN - UPPER-A-AH

## 2020-07-03 NOTE — ED Triage Notes (Signed)
Pt comes into the ED via POV c/o generalized abdominal pain with diarrhea which is now causing chest pain when she has severe pain. Pt has even and unlabored respirations at this time.  Pt denies any new SHOB or dizziness but admits to nausea and vomiting.  Pt denies any cardiac history. Pt in NAD at this time.

## 2020-07-03 NOTE — ED Provider Notes (Signed)
[facility] Emergency Department Provider Note  ____________________________________________  Time seen: Approximately 2:42 PM  I have reviewed the triage vital signs and the nursing notes.   HISTORY  Chief Complaint Abdominal Pain and Chest Pain    HPI Krista Mcgee is a 44 y.o. female with a history of anxiety depression diabetes hypertension and morbid obesity who comes the ED complaining of generalized abdominal pain associated vomiting and watery diarrhea for the past 2 days, pain is crampy, radiates up toward the chest.  No shortness of breath, no exertional symptoms.  No fevers or chills, no dizziness palpitations or syncope.  No aggravating or alleviating factors.  Difficult to eat and drink over the last few days.  Denies sick contacts.  Daughter is in school.      Past Medical History:  Diagnosis Date  . Anxiety   . Depression   . Diabetes mellitus without complication ([place])   . Hypertension      Patient Active Problem List   Diagnosis Date Noted  . Inappropriately high serum insulin 10/25/2018  . Leukocytosis 10/17/2018  . Fluttering heart 07/10/2018  . Abnormal EKG 07/10/2018  . Hepatic steatosis 04/18/2018  . Diverticulosis 04/18/2018  . Lumbar disc disease with radiculopathy 03/03/2018  . Knee pain, bilateral 03/03/2018  . Hip pain, chronic, right 03/03/2018  . Low HDL (under 40) 03/29/2017  . High triglycerides 03/29/2017  . Type 2 diabetes mellitus ([place]) 03/19/2017  . Hirsutism 03/19/2017  . Pap smear for cervical cancer screening 03/19/2017  . Vitamin D deficiency 03/10/2017  . Shortness of breath 03/09/2017  . Medication monitoring encounter 03/09/2017  . Morbid obesity with BMI of 50.0-59.9, adult ([place]) 07/29/2015  . Insomnia 07/29/2015  . Major depressive disorder, recurrent, in full remission with anxious distress ([place]) 07/29/2015  . Snoring 07/29/2015  . Encounter for cholesteral screening for cardiovascular disease  07/29/2015  . Other fatigue 07/29/2015     Past Surgical History:  Procedure Laterality Date  . CHOLECYSTECTOMY    . INCONTINENCE SURGERY    . OOPHORECTOMY    . TUBAL LIGATION       Prior to Admission medications   Medication Sig Start Date End Date Taking? Authorizing Provider  ABILIFY 5 MG tablet TAKE 1 AND 1/2 TABLETS BY MOUTH AT BEDTIME 05/26/15   [provider]  albuterol (PROVENTIL HFA;VENTOLIN HFA) 108 (90 Base) MCG/ACT inhaler Inhale 1-2 puffs into the lungs every 4 (four) hours as needed for wheezing or shortness of breath. 04/12/18   Wilhelmina Mcardle, MD  ALPRAZolam Duanne Moron) 1 MG tablet TAKE 1 TABLET BY MOUTH 3 TIMES A DAY AS NEEDED FOR ANXIETY 06/26/15   [provider]  amitriptyline (ELAVIL) 25 MG tablet TAKE 1 TABLET BY MOUTH EVERYDAY AT BEDTIME 05/16/18   [provider]  atorvastatin (LIPITOR) 10 MG tablet TAKE 1 TABLET BY MOUTH EVERYDAY AT BEDTIME 05/29/18   Lada, Satira Anis, MD  brompheniramine-pseudoephedrine-DM 30-2-10 MG/5ML syrup Take 5 mLs by mouth 4 (four) times daily as needed. 10/31/18   Johnn Hai, PA-C  budesonide-formoterol [facility]) 160-4.5 MCG/ACT inhaler Inhale 2 puffs into the lungs 2 (two) times daily. 01/19/18   Arnetha Courser, MD  buPROPion (WELLBUTRIN XL) 300 MG 24 hr tablet Take 300 mg by mouth every morning. 05/24/18   [provider]  dicyclomine (BENTYL) 10 MG capsule Take 1 capsule (10 mg total) by mouth every 6 (six) hours as needed for up to 7 days for spasms (abdominal pain). 07/03/20 07/10/20  Carrie Mew, MD  famotidine (PEPCID) 20 MG tablet Take 1 tablet (20 mg total) by mouth 2 (two) times daily. 07/03/20   Carrie Mew, MD  HYDROcodone-acetaminophen (NORCO/VICODIN) 5-325 MG tablet Take 1 tablet by mouth every 4 (four) hours as needed. 02/08/19   Harvest Dark, MD  JARDIANCE 10 MG TABS tablet TAKE 1 TABLET BY MOUTH DAILY 11/30/18   Arnetha Courser, MD  metFORMIN (GLUCOPHAGE-XR) 500 MG 24 hr  tablet Take 2 tablets (1,000 mg total) by mouth daily. 03/03/18   Arnetha Courser, MD  naproxen (NAPROSYN) 500 MG tablet Take 1 tablet (500 mg total) by mouth 2 (two) times daily with a meal. 07/03/20   Carrie Mew, MD  ondansetron (ZOFRAN ODT) 4 MG disintegrating tablet Take 1 tablet (4 mg total) by mouth every 8 (eight) hours as needed for nausea or vomiting. 07/03/20   Carrie Mew, MD  predniSONE (DELTASONE) 10 MG tablet Take 3 tablets once a day for 4 days 10/31/18   Letitia Neri L, PA-C  SAPHRIS 10 MG SUBL Take 10 mg by mouth at bedtime. 02/21/17   [provider]  sulfamethoxazole-trimethoprim (BACTRIM DS,SEPTRA DS) 800-160 MG tablet Take 1 tablet by mouth 2 (two) times daily. 10/31/18   Johnn Hai, PA-C  venlafaxine XR (EFFEXOR-XR) 75 MG 24 hr capsule Take 225 mg by mouth daily. 06/18/15   [provider]  Vitamin D, Ergocalciferol, (DRISDOL) 1.25 MG (50000 UT) CAPS capsule Take 1 capsule (50,000 Units total) by mouth every 7 (seven) days. 10/18/18   Arnetha Courser, MD     Allergies Patient has no known allergies.   Family History  Problem Relation Age of Onset  . Cancer Mother        ovarian  . Other Father        tumor in his ear drum  . Diabetes Father   . Heart disease Father   . Hyperlipidemia Father   . Hypertension Father   . Kidney disease Daughter        hydronephrosis  . Arthritis Paternal Grandmother   . COPD Paternal Grandmother   . Emphysema Paternal Grandmother   . Heart disease Paternal Grandmother   . Cancer Paternal Grandfather        skin  . COPD Paternal Grandfather   . Emphysema Paternal Grandfather     Social History Social History   Tobacco Use  . Smoking status: Current Every Day Smoker  . Smokeless tobacco: Never Used  . Tobacco comment: has not smoked in about 25yrs.  Vaping Use  . Vaping Use: Never used  Substance Use Topics  . Alcohol use: Yes    Alcohol/week: 0.0 standard drinks    Comment:  occasionally  . Drug use: No    Review of Systems  Constitutional:   No fever or chills.  ENT:   No sore throat. No rhinorrhea. Cardiovascular:   No chest pain or syncope. Respiratory:   No dyspnea or cough. Gastrointestinal:   Positive as above for abdominal pain, vomiting and diarrhea.  Musculoskeletal:   Negative for focal pain or swelling All other systems reviewed and are negative except as documented above in ROS and HPI.  ____________________________________________   PHYSICAL EXAM:  VITAL SIGNS: ED Triage Vitals  Enc Vitals Group     BP 07/03/20 1148 (!) 114/93     Pulse Rate 07/03/20 1148 91     Resp 07/03/20 1148 18     Temp 07/03/20 1148 98.9 F (37.2 C)  Temp Source 07/03/20 1148 Oral     SpO2 07/03/20 1148 91 %     Weight 07/03/20 1146 (!) 370 lb (167.8 kg)     Height 07/03/20 1146 5\' 7"  (1.702 m)     Head Circumference --      Peak Flow --      Pain Score 07/03/20 1146 6     Pain Loc --      Pain Edu? --      Excl. in [place]? --     Vital signs reviewed, nursing assessments reviewed.   Constitutional:   Alert and oriented. Non-toxic appearance. Eyes:   Conjunctivae are normal. EOMI. PERRL. ENT      Head:   Normocephalic and atraumatic.      Nose:   Wearing a mask.      Mouth/Throat:   Wearing a mask.      Neck:   No meningismus. Full ROM. Hematological/Lymphatic/Immunilogical:   No cervical lymphadenopathy. Cardiovascular:   RRR. Symmetric bilateral radial and DP pulses.  No murmurs. Cap refill less than 2 seconds. Respiratory:   Normal respiratory effort without tachypnea/retractions. Breath sounds are clear and equal bilaterally. No wheezes/rales/rhonchi. Gastrointestinal:   Soft with mild generalized tenderness, nonfocal. Non distended. There is no CVA tenderness.  No rebound, rigidity, or guarding.  Musculoskeletal:   Normal range of motion in all extremities. No joint effusions.  No lower extremity tenderness.  No edema. Neurologic:   Normal  speech and language.  Motor grossly intact. No acute focal neurologic deficits are appreciated.  Skin:    Skin is warm, dry and intact. No rash noted.  No petechiae, purpura, or bullae.  ____________________________________________    LABS (pertinent positives/negatives) (all labs ordered are listed, but only abnormal results are displayed) Labs Reviewed  COMPREHENSIVE METABOLIC PANEL - Abnormal; Notable for the following components:      Result Value   Glucose, Bld 135 (*)    Calcium 8.5 (*)    Total Bilirubin 1.3 (*)    All other components within normal limits  CBC - Abnormal; Notable for the following components:   WBC 12.6 (*)    All other components within normal limits  RESPIRATORY PANEL BY RT PCR (FLU A&B, COVID)  LIPASE, BLOOD  URINALYSIS, COMPLETE (UACMP) WITH MICROSCOPIC   ____________________________________________   EKG Interpreted by me Normal sinus rhythm rate of 89, normal axis and intervals.  Poor R progression.  Normal ST segments and T waves.   ____________________________________________    RADIOLOGY  No results found.  ____________________________________________   PROCEDURES Procedures  ____________________________________________  DIFFERENTIAL DIAGNOSIS   Electrolyte abnormality, viral gastroenteritis, COVID-19, dehydration, gastritis  CLINICAL IMPRESSION / ASSESSMENT AND PLAN / ED COURSE  Medications ordered in the ED: Medications  lactated ringers bolus 1,000 mL (1,000 mLs Intravenous New Bag/Given 07/03/20 1257)  ketorolac (TORADOL) 30 MG/ML injection 15 mg (15 mg Intravenous Given 07/03/20 1254)  pantoprazole (PROTONIX) injection 40 mg (40 mg Intravenous Given 07/03/20 1252)  dicyclomine (BENTYL) injection 20 mg (20 mg Intramuscular Given 07/03/20 1402)    Pertinent labs & imaging results that were available during my care of the patient were reviewed by me and considered in my medical decision making (see chart for  details).  Krista Mcgee was evaluated in Emergency Department on 07/03/2020 for the symptoms described in the history of present illness. She was evaluated in the context of the global COVID-19 pandemic, which necessitated consideration that the patient might be at risk for infection with  the SARS-CoV-2 virus that causes COVID-19. Institutional protocols and algorithms that pertain to the evaluation of patients at risk for COVID-19 are in a state of rapid change based on information released by regulatory bodies including the CDC and federal and state organizations. These policies and algorithms were followed during the patient's care in the ED.   Patient presents with generalized abdominal pain vomiting and watery diarrhea.  Highly likely to be a viral syndrome.  Vital signs are normal.  Exam is reassuring.  Considering the patient's symptoms, medical history, and physical examination today, I have low suspicion for cholecystitis or biliary pathology, pancreatitis, perforation or bowel obstruction, hernia, intra-abdominal abscess, AAA or dissection, volvulus or intussusception, mesenteric ischemia, or appendicitis.  Patient was placed on 2 L nasal cannula by nurse due to borderline oxygen level while sleeping.  Due to patient's morbid obesity, I suspect that she has a degree of resting hypoxia/sleep apnea, no symptoms or findings to suggest respiratory illness.  Covid test negative.  We will treat supportively with anti-inflammatories, nausea medicine, Bentyl, famotidine, follow-up with primary care.      ____________________________________________   FINAL CLINICAL IMPRESSION(S) / ED DIAGNOSES    Final diagnoses:  Viral gastroenteritis  Dehydration     ED Discharge Orders         Ordered    naproxen (NAPROSYN) 500 MG tablet  2 times daily with meals        07/03/20 1441    famotidine (PEPCID) 20 MG tablet  2 times daily        07/03/20 1441    dicyclomine (BENTYL) 10 MG capsule   Every 6 hours PRN        07/03/20 1441    ondansetron (ZOFRAN ODT) 4 MG disintegrating tablet  Every 8 hours PRN        07/03/20 1441          Portions of this note were generated with dragon dictation software. Dictation errors may occur despite best attempts at proofreading.   Carrie Mew, MD 07/03/20 1445

## 2020-07-31 ENCOUNTER — Ambulatory Visit: Payer: Self-pay

## 2020-07-31 NOTE — Telephone Encounter (Signed)
Pt. Noticed left leg swelling 2 days ago. Today she noticed the swelling has gone to her thigh. No pain or redness to leg. States her knee "always hurts." No chest pain or shortness of breath. Appointment made. Will go to ED for worsening of symptoms.  Reason for Disposition . [1] MODERATE leg swelling (e.g., swelling extends up to knees) AND [2] new-onset or worsening  Answer Assessment - Initial Assessment Questions 1. ONSET: "When did the swelling start?" (e.g., minutes, hours, days)     2 Days 2. LOCATION: "What part of the leg is swollen?"  "Are both legs swollen or just one leg?"     Left leg 3. SEVERITY: "How bad is the swelling?" (e.g., localized; mild, moderate, severe)  - Localized - small area of swelling localized to one leg  - MILD pedal edema - swelling limited to foot and ankle, pitting edema < 1/4 inch (6 mm) deep, rest and elevation eliminate most or all swelling  - MODERATE edema - swelling of lower leg to knee, pitting edema > 1/4 inch (6 mm) deep, rest and elevation only partially reduce swelling  - SEVERE edema - swelling extends above knee, facial or hand swelling present      Severe 4. REDNESS: "Does the swelling look red or infected?"     No 5. PAIN: "Is the swelling painful to touch?" If Yes, ask: "How painful is it?"   (Scale 1-10; mild, moderate or severe)     No 6. FEVER: "Do you have a fever?" If Yes, ask: "What is it, how was it measured, and when did it start?"      No 7. CAUSE: "What do you think is causing the leg swelling?"     Unsure 8. MEDICAL HISTORY: "Do you have a history of heart failure, kidney disease, liver failure, or cancer?"     No 9. RECURRENT SYMPTOM: "Have you had leg swelling before?" If Yes, ask: "When was the last time?" "What happened that time?"     No 10. OTHER SYMPTOMS: "Do you have any other symptoms?" (e.g., chest pain, difficulty breathing)       No 11. PREGNANCY: "Is there any chance you are pregnant?" "When was your last  menstrual period?"  Protocols used: LEG SWELLING AND EDEMA-A-AH

## 2020-08-01 NOTE — Telephone Encounter (Signed)
FYI

## 2020-08-02 ENCOUNTER — Other Ambulatory Visit: Payer: Self-pay

## 2020-08-02 ENCOUNTER — Encounter: Payer: Self-pay | Admitting: Family Medicine

## 2020-08-02 ENCOUNTER — Ambulatory Visit: Payer: Medicaid Other | Admitting: Family Medicine

## 2020-08-02 ENCOUNTER — Telehealth: Payer: Self-pay | Admitting: Family Medicine

## 2020-08-02 ENCOUNTER — Ambulatory Visit
Admission: RE | Admit: 2020-08-02 | Discharge: 2020-08-02 | Disposition: A | Discharge status: Home / Self Care | Payer: Medicaid Other | Source: Ambulatory Visit | Attending: Family Medicine | Admitting: Family Medicine

## 2020-08-02 VITALS — BP 124/78 | HR 93 | Temp 98.3°F | Resp 20 | Ht 67.0 in | Wt 370.5 lb

## 2020-08-02 DIAGNOSIS — Z6841 Body Mass Index (BMI) 40.0 and over, adult: Secondary | ICD-10-CM

## 2020-08-02 DIAGNOSIS — M7989 Other specified soft tissue disorders: Secondary | ICD-10-CM | POA: Insufficient documentation

## 2020-08-02 DIAGNOSIS — Z5181 Encounter for therapeutic drug level monitoring: Secondary | ICD-10-CM

## 2020-08-02 DIAGNOSIS — M79662 Pain in left lower leg: Secondary | ICD-10-CM | POA: Diagnosis present

## 2020-08-02 DIAGNOSIS — E785 Hyperlipidemia, unspecified: Secondary | ICD-10-CM

## 2020-08-02 DIAGNOSIS — E119 Type 2 diabetes mellitus without complications: Secondary | ICD-10-CM

## 2020-08-02 DIAGNOSIS — F3342 Major depressive disorder, recurrent, in full remission: Secondary | ICD-10-CM

## 2020-08-02 DIAGNOSIS — R0602 Shortness of breath: Secondary | ICD-10-CM

## 2020-08-02 MED ORDER — BUDESONIDE-FORMOTEROL FUMARATE 160-4.5 MCG/ACT IN AERO: 2.0000 | INHALATION_SPRAY | Freq: Two times a day (BID) | RESPIRATORY_TRACT | 11 refills | Status: DC

## 2020-08-02 MED ORDER — METFORMIN HCL ER 500 MG PO TB24: 1000.0000 mg | ORAL_TABLET | Freq: Every day | ORAL | 5 refills | Status: DC

## 2020-08-02 MED ORDER — EMPAGLIFLOZIN 10 MG PO TABS: 10.0000 mg | ORAL_TABLET | Freq: Every day | ORAL | 5 refills | Status: DC

## 2020-08-02 MED ORDER — ATORVASTATIN CALCIUM 10 MG PO TABS: ORAL_TABLET | ORAL | 5 refills | Status: DC

## 2020-08-02 MED ORDER — ALBUTEROL SULFATE HFA 108 (90 BASE) MCG/ACT IN AERS
1.0000 | INHALATION_SPRAY | RESPIRATORY_TRACT | 10 refills | Status: DC | PRN
Start: 2020-08-02 — End: 2021-08-26

## 2020-08-02 NOTE — Patient Instructions (Signed)
If you experience any worsening leg pain or swelling or any new different or severe chest pain shortness of breath or problems related to this with any exertion such as worsening chest pain and rapid heart rate with minor activity, please go to the ER for evaluation.  If you have a blood clot we will start medication to treat it and we will have follow up related to figuring out why, etc.  If there is not clot, but symptoms don't resolve, we may need to repeat imaging or do other test or work-up so please return if not better.  Edema  Edema is when you have too much fluid in your body or under your skin. Edema may make your legs, feet, and ankles swell up. Swelling is also common in looser tissues, like around your eyes. This is a common condition. It gets more common as you get older. There are many possible causes of edema. Eating too much salt (sodium) and being on your feet or sitting for a long time can cause edema in your legs, feet, and ankles. Hot weather may make edema worse. Edema is usually painless. Your skin may look swollen or shiny. Follow these instructions at home:  Keep the swollen body part raised (elevated) above the level of your heart when you are sitting or lying down.  Do not sit still or stand for a long time.  Do not wear tight clothes. Do not wear garters on your upper legs.  Exercise your legs. This can help the swelling go down.  Wear elastic bandages or support stockings as told by your doctor.  Eat a low-salt (low-sodium) diet to reduce fluid as told by your doctor.  Depending on the cause of your swelling, you may need to limit how much fluid you drink (fluid restriction).  Take over-the-counter and prescription medicines only as told by your doctor. Contact a doctor if:  Treatment is not working.  You have heart, liver, or kidney disease and have symptoms of edema.  You have sudden and unexplained weight gain. Get help right away if:  You have  shortness of breath or chest pain.  You cannot breathe when you lie down.  You have pain, redness, or warmth in the swollen areas.  You have heart, liver, or kidney disease and get edema all of a sudden.  You have a fever and your symptoms get worse all of a sudden. Summary  Edema is when you have too much fluid in your body or under your skin.  Edema may make your legs, feet, and ankles swell up. Swelling is also common in looser tissues, like around your eyes.  Raise (elevate) the swollen body part above the level of your heart when you are sitting or lying down.  Follow your doctor's instructions about diet and how much fluid you can drink (fluid restriction). This information is not intended to replace advice given to you by your health care provider. Make sure you discuss any questions you have with your health care provider. Document Revised: 08/13/2017 Document Reviewed: 08/28/2016 Elsevier Patient Education  [place].   Deep Vein Thrombosis  Deep vein thrombosis (DVT) is a condition in which a blood clot forms in a deep vein, such as a lower leg, thigh, or arm vein. A clot is blood that has thickened into a gel or solid. This condition is dangerous. It can lead to serious and even life-threatening complications if the clot travels to the lungs and causes a blockage (  pulmonary embolism). It can also damage veins in the leg. This can result in leg pain, swelling, discoloration, and sores (post-thrombotic syndrome). What are the causes? This condition may be caused by:  A slowdown of blood flow.  Damage to a vein.  A condition that causes blood to clot more easily, such as an inherited clotting disorder. What increases the risk? The following factors may make you more likely to develop this condition:  Being overweight.  Being older, especially over age 75.  Sitting or lying down for more than four hours.  Being in the hospital.  Lack of physical activity  (sedentary lifestyle).  Pregnancy, being in childbirth, or having recently given birth.  Taking medicines that contain estrogen, such as medicines to prevent pregnancy.  Smoking.  A history of any of the following: ? Blood clots or a blood clotting disease. ? Peripheral vascular disease. ? Inflammatory bowel disease. ? Cancer. ? Heart disease. ? Genetic conditions that affect how your blood clots, such as Factor V Leiden mutation. ? Neurological diseases that affect your legs (leg paresis). ? A recent injury, such as a car accident. ? Major or lengthy surgery. ? A central line placed inside a large vein. What are the signs or symptoms? Symptoms of this condition include:  Swelling, pain, or tenderness in an arm or leg.  Warmth, redness, or discoloration in an arm or leg. If the clot is in your leg, symptoms may be more noticeable or worse when you stand or walk. Some people may not develop any symptoms. How is this diagnosed? This condition is diagnosed with:  A medical history and physical exam.  Tests, such as: ? Blood tests. These are done to check how well your blood clots. ? Ultrasound. This is done to check for clots. ? Venogram. For this test, contrast dye is injected into a vein and X-rays are taken to check for any clots. How is this treated? Treatment for this condition depends on:  The cause of your DVT.  Your risk for bleeding or developing more clots.  Any other medical conditions that you have. Treatment may include:  Taking a blood thinner (anticoagulant). This type of medicine prevents clots from forming. It may be taken by mouth, injected under the skin, or injected through an IV (catheter).  Injecting clot-dissolving medicines into the affected vein (catheter-directed thrombolysis).  Having surgery. Surgery may be done to: ? Remove the clot. ? Place a filter in a large vein to catch blood clots before they reach the lungs. Some treatments may be  continued for up to six months. Follow these instructions at home: If you are taking blood thinners:  Take the medicine exactly as told by your health care provider. Some blood thinners need to be taken at the same time every day. Do not skip a dose.  Talk with your health care provider before you take any medicines that contain aspirin or NSAIDs. These medicines increase your risk for dangerous bleeding.  Ask your health care provider about foods and drugs that could change the way the medicine works (may interact). Avoid those things if your health care provider tells you to do so.  Blood thinners can cause easy bruising and may make it difficult to stop bleeding. Because of this: ? Be very careful when using knives, scissors, or other sharp objects. ? Use an electric razor instead of a blade. ? Avoid activities that could cause injury or bruising, and follow instructions about how to prevent falls.  Wear a medical alert bracelet or carry a card that lists what medicines you take. General instructions  Take over-the-counter and prescription medicines only as told by your health care provider.  Return to your normal activities as told by your health care provider. Ask your health care provider what activities are safe for you.  Wear compression stockings if recommended by your health care provider.  Keep all follow-up visits as told by your health care provider. This is important. How is this prevented? To lower your risk of developing this condition again:  For 30 or more minutes every day, do an activity that: ? Involves moving your arms and legs. ? Increases your heart rate.  When traveling for longer than four hours: ? Exercise your arms and legs every hour. ? Drink plenty of water. ? Avoid drinking alcohol.  Avoid sitting or lying for a long time without moving your legs.  If you have surgery or you are hospitalized, ask about ways to prevent blood clots. These may  include taking frequent walks or using anticoagulants.  Stay at a healthy weight.  If you are a woman who is older than age 37, avoid unnecessary use of medicines that contain estrogen, such as some birth control pills.  Do not use any products that contain nicotine or tobacco, such as cigarettes and e-cigarettes. This is especially important if you take estrogen medicines. If you need help quitting, ask your health care provider. Contact a health care provider if:  You miss a dose of your blood thinner.  Your menstrual period is heavier than usual.  You have unusual bruising. Get help right away if:  You have: ? New or increased pain, swelling, or redness in an arm or leg. ? Numbness or tingling in an arm or leg. ? Shortness of breath. ? Chest pain. ? A rapid or irregular heartbeat. ? A severe headache or confusion. ? A cut that will not stop bleeding.  There is blood in your vomit, stool, or urine.  You have a serious fall or accident, or you hit your head.  You feel light-headed or dizzy.  You cough up blood. These symptoms may represent a serious problem that is an emergency. Do not wait to see if the symptoms will go away. Get medical help right away. Call your local emergency services (911 in the [place] Do not drive yourself to the hospital. Summary  Deep vein thrombosis (DVT) is a condition in which a blood clot forms in a deep vein, such as a lower leg, thigh, or arm vein.  Symptoms can include swelling, warmth, pain, and redness in your leg or arm.  This condition may be treated with a blood thinner (anticoagulant medicine), medicine that is injected to dissolve blood clots,compression stockings, or surgery.  If you are prescribed blood thinners, take them exactly as told. This information is not intended to replace advice given to you by your health care provider. Make sure you discuss any questions you have with your health care provider. Document Revised:  07/23/2017 Document Reviewed: 01/08/2017 Elsevier Patient Education  [place].

## 2020-08-02 NOTE — Telephone Encounter (Signed)
Jani Files calling from [facility] is calling because an appt was scheduled for today 08/02/20 for ultrasound stat and patient did not show up. Will leave order in the system. And will work the patient in. Please advise  CB- [phone]

## 2020-08-02 NOTE — Progress Notes (Signed)
Patient ID: Krista Mcgee, female    DOB: 04-Mar-1976, 44 y.o.   MRN: [id number]  PCP: Arnetha Courser, MD  Chief Complaint  Patient presents with  . Leg Swelling    Bilateral    Subjective:   Krista Mcgee is a 44 y.o. female, presents to clinic with CC of the following:  HPI  Pt. Noticed left leg swelling 2 days ago. Today she noticed the swelling has gone to her thigh. No pain or redness to leg. States her knee "always hurts." No chest pain or shortness of breath. Appointment made. Will go to ED for worsening of symptoms.  Left leg swelling onset about one week ago, started in her calf, has gradually worsened and progressed up her leg No injury or strain, she has difficulty with joint pain and ambulation but her activity level has not changed significantly lately she denies any recent long car rides or airplane travel.  She is not on any birth control, she has no history of blood clots or coagulopathy.  She denies any redness to the skin, no knee pain or injury different from normal joint pain for her.  She does have a history of morbid obesity, type 2 diabetes, hyperlipidemia, her PCP left this clinic more than a year and a half ago and patient has not returned for care.  She is out of all of her medications, was managed on Jardiance, Metformin, Lipitor  DM:   Hx of being well controlled in the past on jardiance and metformin XR 1000 mg once daily - some GI sensitivity Denies: Polyuria, polydipsia, vision changes, neuropathy, hypoglycemia Recent pertinent labs: Lab Results  Component Value Date   HGBA1C 6.1 (A) 10/17/2018   HGBA1C 6.1 10/17/2018   HGBA1C 6.1 10/17/2018   HGBA1C 6.1 10/17/2018   HLD - was on lipitor 10 mg - did well with this in the past, off meds for at least this year Lab Results  Component Value Date   CHOL 190 10/17/2018   HDL 33 (L) 10/17/2018   LDLCALC 128 (H) 10/17/2018   TRIG 171 (H) 10/17/2018   CHOLHDL 5.8 (H) 10/17/2018   Hx of  smoking, morbid obesity, DOE -patient previously consulted with pulmonology saw Dr. Jamal Collin who is no longer with low our pulmonary, she was put on Symbicort and albuterol rescue inhaler and that did help her breathing but that was several years ago and she has been out of her inhalers and she does have persistent baseline shortness of breath, no significant change or worsening.  In the last week with her left calf pain and leg swelling she denies any palpitations, shortness of breath, tachycardia  Sees [facility] -   Patient Active Problem List   Diagnosis Date Noted  . Inappropriately high serum insulin 10/25/2018  . Leukocytosis 10/17/2018  . Fluttering heart 07/10/2018  . Abnormal EKG 07/10/2018  . Hepatic steatosis 04/18/2018  . Diverticulosis 04/18/2018  . Lumbar disc disease with radiculopathy 03/03/2018  . Knee pain, bilateral 03/03/2018  . Hip pain, chronic, right 03/03/2018  . Low HDL (under 40) 03/29/2017  . High triglycerides 03/29/2017  . Type 2 diabetes mellitus ([place]) 03/19/2017  . Hirsutism 03/19/2017  . Pap smear for cervical cancer screening 03/19/2017  . Vitamin D deficiency 03/10/2017  . Shortness of breath 03/09/2017  . Medication monitoring encounter 03/09/2017  . Morbid obesity with BMI of 50.0-59.9, adult ([place]) 07/29/2015  . Insomnia 07/29/2015  . Major depressive disorder, recurrent, in full  remission with anxious distress ([place]) 07/29/2015  . Snoring 07/29/2015  . Encounter for cholesteral screening for cardiovascular disease 07/29/2015  . Other fatigue 07/29/2015      Current Outpatient Medications:  .  ABILIFY 5 MG tablet, TAKE 1 AND 1/2 TABLETS BY MOUTH AT BEDTIME, Disp: , Rfl: 5 .  ALPRAZolam (XANAX) 1 MG tablet, TAKE 1 TABLET BY MOUTH 3 TIMES A DAY AS NEEDED FOR ANXIETY, Disp: , Rfl: 2 .  buPROPion (WELLBUTRIN XL) 300 MG 24 hr tablet, Take 300 mg by mouth every morning., Disp: , Rfl: 2 .  SAPHRIS 10 MG SUBL, Take 10 mg by mouth at  bedtime., Disp: , Rfl: 1 .  venlafaxine XR (EFFEXOR-XR) 75 MG 24 hr capsule, Take 225 mg by mouth daily., Disp: , Rfl: 5 .  albuterol (VENTOLIN HFA) 108 (90 Base) MCG/ACT inhaler, Inhale 1-2 puffs into the lungs every 4 (four) hours as needed for wheezing or shortness of breath., Disp: 1 each, Rfl: 10 .  atorvastatin (LIPITOR) 10 MG tablet, TAKE 1 TABLET BY MOUTH EVERYDAY AT BEDTIME, Disp: 30 tablet, Rfl: 5 .  budesonide-formoterol (SYMBICORT) 160-4.5 MCG/ACT inhaler, Inhale 2 puffs into the lungs 2 (two) times daily., Disp: 1 each, Rfl: 11 .  empagliflozin (JARDIANCE) 10 MG TABS tablet, Take 1 tablet (10 mg total) by mouth daily., Disp: 30 tablet, Rfl: 5 .  metFORMIN (GLUCOPHAGE-XR) 500 MG 24 hr tablet, Take 2 tablets (1,000 mg total) by mouth daily., Disp: 60 tablet, Rfl: 5   No Known Allergies   Social History   Tobacco Use  . Smoking status: Current Every Day Smoker  . Smokeless tobacco: Never Used  . Tobacco comment: has not smoked in about 58yrs.  Vaping Use  . Vaping Use: Never used  Substance Use Topics  . Alcohol use: Yes    Alcohol/week: 0.0 standard drinks    Comment: occasionally  . Drug use: No      Chart Review Today: I personally reviewed active problem list, medication list, allergies, family history, social history, health maintenance, notes from last encounter, lab results, imaging with the patient/caregiver today.   Review of Systems  Constitutional: Negative.  Negative for activity change, appetite change, chills, diaphoresis, fatigue, fever and unexpected weight change.  HENT: Negative.   Eyes: Negative.   Respiratory: Positive for shortness of breath (chronic). Negative for apnea, cough, chest tightness and wheezing.   Cardiovascular: Positive for leg swelling. Negative for chest pain and palpitations.  Gastrointestinal: Negative.  Negative for abdominal pain and blood in stool.  Endocrine: Negative.   Genitourinary: Negative.   Skin: Negative.  Negative  for color change, pallor and rash.  Allergic/Immunologic: Negative.   Neurological: Negative.  Negative for syncope and weakness.  Hematological: Negative.   All other systems reviewed and are negative.      Objective:   Vitals:   08/02/20 1448  BP: 124/78  Pulse: 93  Resp: 20  Temp: 98.3 F (36.8 C)  TempSrc: Oral  SpO2: 100%  Weight: (!) 370 lb 8 oz (168.1 kg)  Height: 5\' 7"  (1.702 m)    Body mass index is 58.03 kg/m.  Physical Exam Vitals and nursing note reviewed.  Constitutional:      General: She is not in acute distress.    Appearance: She is well-developed and well-groomed. She is morbidly obese. She is not ill-appearing, toxic-appearing or diaphoretic.     Interventions: Face mask in place.  HENT:     Head: Normocephalic and atraumatic.  Right Ear: External ear normal.     Left Ear: External ear normal.  Eyes:     General: No scleral icterus.       Right eye: No discharge.        Left eye: No discharge.  Cardiovascular:     Rate and Rhythm: Normal rate and regular rhythm.     Chest Wall: PMI is not displaced. No thrill.     Pulses:          Radial pulses are 2+ on the right side and 2+ on the left side.     Heart sounds: Heart sounds not distant. No friction rub. No gallop.      Comments: No pitting edema b/l Left calf circumference 58 cm and right calf circumference 55-56 cm No tenderness palpated to legs or calves no erythema no palpable cord to popliteal fossa bilaterally Pulmonary:     Effort: Pulmonary effort is normal. No tachypnea or accessory muscle usage.     Breath sounds: Normal breath sounds. No stridor, decreased air movement or transmitted upper airway sounds. No decreased breath sounds, wheezing, rhonchi or rales.  Musculoskeletal:        General: Swelling present.  Skin:    General: Skin is warm and dry.     Coloration: Skin is not jaundiced.     Findings: No erythema.  Neurological:     Mental Status: She is alert.     Gait: Gait  abnormal.  Psychiatric:        Mood and Affect: Mood normal.        Behavior: Behavior normal. Behavior is cooperative.      Results for orders placed or performed during the hospital encounter of 07/03/20  Respiratory Panel by RT PCR (Flu A&B, Covid) - Nasopharyngeal Swab   Specimen: Nasopharyngeal Swab  Result Value Ref Range   SARS Coronavirus 2 by RT PCR NEGATIVE NEGATIVE   Influenza A by PCR NEGATIVE NEGATIVE   Influenza B by PCR NEGATIVE NEGATIVE  Lipase, blood  Result Value Ref Range   Lipase 20 11 - 51 U/L  Comprehensive metabolic panel  Result Value Ref Range   Sodium 136 135 - 145 mmol/L   Potassium 4.0 3.5 - 5.1 mmol/L   Chloride 101 98 - 111 mmol/L   CO2 23 22 - 32 mmol/L   Glucose, Bld 135 (H) 70 - 99 mg/dL   BUN 12 6 - 20 mg/dL   Creatinine, Ser 0.70 0.44 - 1.00 mg/dL   Calcium 8.5 (L) 8.9 - 10.3 mg/dL   Total Protein 7.4 6.5 - 8.1 g/dL   Albumin 3.5 3.5 - 5.0 g/dL   AST 21 15 - 41 U/L   ALT 22 0 - 44 U/L   Alkaline Phosphatase 63 38 - 126 U/L   Total Bilirubin 1.3 (H) 0.3 - 1.2 mg/dL   GFR, Estimated >60 >60 mL/min   Anion gap 12 5 - 15  CBC  Result Value Ref Range   WBC 12.6 (H) 4.0 - 10.5 K/uL   RBC 5.02 3.87 - 5.11 MIL/uL   Hemoglobin 13.9 12.0 - 15.0 g/dL   HCT 44.5 36.0 - 46.0 %   MCV 88.6 80.0 - 100.0 fL   MCH 27.7 26.0 - 34.0 pg   MCHC 31.2 30.0 - 36.0 g/dL   RDW 15.0 11.5 - 15.5 %   Platelets 157 150 - 400 K/uL   nRBC 0.0 0.0 - 0.2 %       Assessment &  Plan:   1. Left leg swelling New onset left calf swelling 1 week ago no inciting injury, progression over the past week with swelling moving up her leg, exam today shows left calf circumference is 3 cm larger than right calf, no other significant findings on exam, lungs are clear, denies chest pain shortness of breath, no coagulopathy, no injury or strain, no tenderness on exam, patient is not on birth control Does have slight risk factor of morbid obesity and some immobility, she is not very  active but she has not had any prolonged stasis any different from her normal  If she has a DVT we will check her renal function and start her on a oral anticoagulant Plan has been discussed with the patient who verbalizes understanding - COMPLETE METABOLIC PANEL WITH GFR - US Venous Img Lower Bilateral (DVT)  2. Pain of left calf See above, no palpable tenderness, no injury, Achilles tendon unremarkable, knee exam unremarkable, somewhat limited by body habitus - CBC with Differential/Platelet - COMPLETE METABOLIC PANEL WITH GFR - US Venous Img Lower Bilateral (DVT)  3. Type 2 diabetes mellitus without complication, without long-term current use of insulin (HCC) Off meds, has been well controlled in the past, get labs today and asked the patient to return for routine office visit We discussed restarting Metformin with starting with 1 pill with food and waiting 1 to 2 weeks for GI tolerance and then increasing back to her prior dose Will check renal function and if normal she can restart Jardiance as well. - COMPLETE METABOLIC PANEL WITH GFR - Lipid panel - Hemoglobin A1c - empagliflozin (JARDIANCE) 10 MG TABS tablet; Take 1 tablet (10 mg total) by mouth daily.  Dispense: 30 tablet; Refill: 5 - metFORMIN (GLUCOPHAGE-XR) 500 MG 24 hr tablet; Take 2 tablets (1,000 mg total) by mouth daily.  Dispense: 60 tablet; Refill: 5 - atorvastatin (LIPITOR) 10 MG tablet; TAKE 1 TABLET BY MOUTH EVERYDAY AT BEDTIME  Dispense: 30 tablet; Refill: 5  4. Morbid obesity with BMI of 50.0-59.9, adult ([place])  5. Major depressive disorder, recurrent, in full remission with anxious distress ([place]) Managed by [place] [facility] depression and anxiety screening is positive today  6. Shortness of breath History of shortness of breath and dyspnea on exertion no change from her baseline refill inhalers Lungs clear to auscultation no wheeze rhonchi rales - budesonide-formoterol (SYMBICORT) 160-4.5 MCG/ACT inhaler;  Inhale 2 puffs into the lungs 2 (two) times daily.  Dispense: 1 each; Refill: 11  7. Medication monitoring encounter  - CBC with Differential/Platelet - COMPLETE METABOLIC PANEL WITH GFR - Lipid panel  8. Hyperlipidemia, unspecified hyperlipidemia type Off statin medication for almost a year or more Recheck labs, restart medications if no contraindications - CBC with Differential/Platelet - COMPLETE METABOLIC PANEL WITH GFR - Lipid panel - atorvastatin (LIPITOR) 10 MG tablet; TAKE 1 TABLET BY MOUTH EVERYDAY AT BEDTIME  Dispense: 30 tablet; Refill: 5    Patient presents with left calf pain and swelling onset 1 week ago without injury or inciting incident, no stasis, no past blood clots, she is on birth control, no history of trauma or significant stasis -though she is morbidly obese and has dyspnea on exertion and multiple areas of joint pain and she is not very active-   Would like to rule out DVT, have ordered a stat scan today  Her left calf is 58 cm in diameter right calf is 55 cm an appreciable difference I could not feel any difference in the  skin or muscles there was no erythema, induration, noticeable edema no left popliteal fossa tenderness to palpation no palpable cord and no appreciable left knee pain or abnormality or effusion on exam.  Exam is somewhat limited due to body habitus  We will get basic labs for her other chronic conditions and have invited patient to come back and establish care and address her lab results, past diagnoses and discuss treatments and medications.  I have discussed with her what I can do today in the outpatient setting, will get labs to check her renal function, there may be a delay getting the DVT study done, patient verbalizes understanding that if there is any significant worsening or chest pain that she is to go to the ER for evaluation.     Delsa Grana, PA-C 08/02/20 3:26 PM

## 2020-08-03 LAB — CBC WITH DIFFERENTIAL/PLATELET
Absolute Monocytes: 490 cells/uL (ref 200–950)
Basophils Absolute: 72 cells/uL (ref 0–200)
Basophils Relative: 0.5 %
Eosinophils Absolute: 576 cells/uL — ABNORMAL HIGH (ref 15–500)
Eosinophils Relative: 4 %
HCT: 42.2 % (ref 35.0–45.0)
Hemoglobin: 14 g/dL (ref 11.7–15.5)
Lymphs Abs: 3787 cells/uL (ref 850–3900)
MCH: 28.1 pg (ref 27.0–33.0)
MCHC: 33.2 g/dL (ref 32.0–36.0)
MCV: 84.7 fL (ref 80.0–100.0)
MPV: 9.6 fL (ref 7.5–12.5)
Monocytes Relative: 3.4 %
Neutro Abs: 9475 cells/uL — ABNORMAL HIGH (ref 1500–7800)
Neutrophils Relative %: 65.8 %
Platelets: 316 10*3/uL (ref 140–400)
RBC: 4.98 10*6/uL (ref 3.80–5.10)
RDW: 14.1 % (ref 11.0–15.0)
Total Lymphocyte: 26.3 %
WBC: 14.4 10*3/uL — ABNORMAL HIGH (ref 3.8–10.8)

## 2020-08-03 LAB — LIPID PANEL
Cholesterol: 212 mg/dL — ABNORMAL HIGH (ref <200)
HDL: 33 mg/dL — ABNORMAL LOW (ref >=50)
LDL Cholesterol (Calc): 140 mg/dL (calc) — ABNORMAL HIGH
Non-HDL Cholesterol (Calc): 179 mg/dL (calc) — ABNORMAL HIGH (ref <130)
Total CHOL/HDL Ratio: 6.4 (calc) — ABNORMAL HIGH (ref <5.0)
Triglycerides: 252 mg/dL — ABNORMAL HIGH (ref <150)

## 2020-08-03 LAB — COMPLETE METABOLIC PANEL WITH GFR
AG Ratio: 1.3 (calc) (ref 1.0–2.5)
ALT: 22 U/L (ref 6–29)
AST: 28 U/L (ref 10–30)
Albumin: 4 g/dL (ref 3.6–5.1)
Alkaline phosphatase (APISO): 76 U/L (ref 31–125)
BUN: 14 mg/dL (ref 7–25)
CO2: 30 mmol/L (ref 20–32)
Calcium: 9.4 mg/dL (ref 8.6–10.2)
Chloride: 101 mmol/L (ref 98–110)
Creat: 0.8 mg/dL (ref 0.50–1.10)
GFR, Est African American: 105 mL/min/{1.73_m2} (ref >=60)
GFR, Est Non African American: 91 mL/min/{1.73_m2} (ref >=60)
Globulin: 3.2 g/dL (calc) (ref 1.9–3.7)
Glucose, Bld: 108 mg/dL — ABNORMAL HIGH (ref 65–99)
Potassium: 4.5 mmol/L (ref 3.5–5.3)
Sodium: 140 mmol/L (ref 135–146)
Total Bilirubin: 0.6 mg/dL (ref 0.2–1.2)
Total Protein: 7.2 g/dL (ref 6.1–8.1)

## 2020-08-03 LAB — HEMOGLOBIN A1C
Hgb A1c MFr Bld: 6.8 % of total Hgb — ABNORMAL HIGH (ref <5.7)
Mean Plasma Glucose: 148 mg/dL
eAG (mmol/L): 8.2 mmol/L

## 2020-08-05 NOTE — Telephone Encounter (Signed)
Patient did go for [facility]

## 2020-08-26 ENCOUNTER — Other Ambulatory Visit: Payer: Self-pay

## 2020-08-26 ENCOUNTER — Telehealth: Payer: Medicaid Other | Admitting: Family Medicine

## 2020-09-25 ENCOUNTER — Telehealth: Payer: Self-pay | Admitting: Family Medicine

## 2020-09-25 NOTE — Telephone Encounter (Signed)
Copied from [place] [id number]. Topic: Referral - Request for Referral >> Sep 25, 2020 12:50 PM Leward Quan A wrote: Has patient seen PCP for this complaint?  *If NO, is insurance requiring patient see PCP for this issue before PCP can refer them? Referral for which specialty: Bariatric Preferred provider/office: [facility] if they accept the patient insurance  Reason for referral: health issues and weight

## 2020-09-26 NOTE — Telephone Encounter (Signed)
Left patient message to call office to discuss this procedure/referral

## 2020-09-26 NOTE — Telephone Encounter (Signed)
PT called back to speak with Bonnita Nasuti, was advised she will call when she is back in the office.

## 2020-09-30 NOTE — Telephone Encounter (Signed)
Patient stated it was Dr.Lada who gave referral. [facility] did call her before but she could not make appointment. I advised patient to reach out to [facility] or [place] which ever one called her and request paperwork to be sent to her and make appointment with Kristeen Miss

## 2021-01-21 ENCOUNTER — Ambulatory Visit: Payer: Medicaid Other | Admitting: Unknown Physician Specialty

## 2021-08-25 ENCOUNTER — Other Ambulatory Visit: Payer: Self-pay | Admitting: Family Medicine

## 2021-08-26 ENCOUNTER — Other Ambulatory Visit: Payer: Self-pay

## 2021-10-19 ENCOUNTER — Emergency Department: Payer: Medicaid Other

## 2021-10-19 ENCOUNTER — Other Ambulatory Visit: Payer: Self-pay

## 2021-10-19 ENCOUNTER — Encounter: Payer: Self-pay | Admitting: Emergency Medicine

## 2021-10-19 ENCOUNTER — Inpatient Hospital Stay
Admission: EM | Admit: 2021-10-19 | Discharge: 2021-10-23 | DRG: 189 | Disposition: A | Discharge status: Home / Self Care | Payer: Medicaid Other | Attending: Internal Medicine | Admitting: Internal Medicine

## 2021-10-19 DIAGNOSIS — Z152 Genetic susceptibility to obesity: Secondary | ICD-10-CM

## 2021-10-19 DIAGNOSIS — Z90721 Acquired absence of ovaries, unilateral: Secondary | ICD-10-CM

## 2021-10-19 DIAGNOSIS — J9622 Acute and chronic respiratory failure with hypercapnia: Secondary | ICD-10-CM | POA: Diagnosis present

## 2021-10-19 DIAGNOSIS — J9621 Acute and chronic respiratory failure with hypoxia: Secondary | ICD-10-CM

## 2021-10-19 DIAGNOSIS — Z833 Family history of diabetes mellitus: Secondary | ICD-10-CM

## 2021-10-19 DIAGNOSIS — L680 Hirsutism: Secondary | ICD-10-CM | POA: Diagnosis present

## 2021-10-19 DIAGNOSIS — R0602 Shortness of breath: Secondary | ICD-10-CM

## 2021-10-19 DIAGNOSIS — Z6841 Body Mass Index (BMI) 40.0 and over, adult: Secondary | ICD-10-CM

## 2021-10-19 DIAGNOSIS — J9601 Acute respiratory failure with hypoxia: Secondary | ICD-10-CM | POA: Diagnosis present

## 2021-10-19 DIAGNOSIS — I509 Heart failure, unspecified: Secondary | ICD-10-CM

## 2021-10-19 DIAGNOSIS — E781 Pure hyperglyceridemia: Secondary | ICD-10-CM | POA: Diagnosis present

## 2021-10-19 DIAGNOSIS — Z28310 Unvaccinated for covid-19: Secondary | ICD-10-CM

## 2021-10-19 DIAGNOSIS — Z7984 Long term (current) use of oral hypoglycemic drugs: Secondary | ICD-10-CM

## 2021-10-19 DIAGNOSIS — F17290 Nicotine dependence, other tobacco product, uncomplicated: Secondary | ICD-10-CM | POA: Diagnosis present

## 2021-10-19 DIAGNOSIS — Z20822 Contact with and (suspected) exposure to covid-19: Secondary | ICD-10-CM | POA: Diagnosis present

## 2021-10-19 DIAGNOSIS — E7800 Pure hypercholesterolemia, unspecified: Secondary | ICD-10-CM | POA: Diagnosis present

## 2021-10-19 DIAGNOSIS — E119 Type 2 diabetes mellitus without complications: Secondary | ICD-10-CM | POA: Diagnosis present

## 2021-10-19 DIAGNOSIS — I5033 Acute on chronic diastolic (congestive) heart failure: Secondary | ICD-10-CM | POA: Diagnosis present

## 2021-10-19 DIAGNOSIS — E662 Morbid (severe) obesity with alveolar hypoventilation: Secondary | ICD-10-CM | POA: Diagnosis present

## 2021-10-19 DIAGNOSIS — R0902 Hypoxemia: Secondary | ICD-10-CM

## 2021-10-19 DIAGNOSIS — Z7951 Long term (current) use of inhaled steroids: Secondary | ICD-10-CM

## 2021-10-19 DIAGNOSIS — F419 Anxiety disorder, unspecified: Secondary | ICD-10-CM | POA: Diagnosis present

## 2021-10-19 DIAGNOSIS — J129 Viral pneumonia, unspecified: Secondary | ICD-10-CM | POA: Diagnosis present

## 2021-10-19 DIAGNOSIS — J9611 Chronic respiratory failure with hypoxia: Secondary | ICD-10-CM | POA: Diagnosis present

## 2021-10-19 DIAGNOSIS — F17210 Nicotine dependence, cigarettes, uncomplicated: Secondary | ICD-10-CM | POA: Diagnosis present

## 2021-10-19 DIAGNOSIS — Z825 Family history of asthma and other chronic lower respiratory diseases: Secondary | ICD-10-CM

## 2021-10-19 DIAGNOSIS — Z8249 Family history of ischemic heart disease and other diseases of the circulatory system: Secondary | ICD-10-CM

## 2021-10-19 DIAGNOSIS — Z83438 Family history of other disorder of lipoprotein metabolism and other lipidemia: Secondary | ICD-10-CM

## 2021-10-19 DIAGNOSIS — J9612 Chronic respiratory failure with hypercapnia: Secondary | ICD-10-CM | POA: Diagnosis present

## 2021-10-19 DIAGNOSIS — K760 Fatty (change of) liver, not elsewhere classified: Secondary | ICD-10-CM | POA: Diagnosis present

## 2021-10-19 DIAGNOSIS — E785 Hyperlipidemia, unspecified: Secondary | ICD-10-CM | POA: Diagnosis present

## 2021-10-19 DIAGNOSIS — E876 Hypokalemia: Secondary | ICD-10-CM | POA: Diagnosis present

## 2021-10-19 DIAGNOSIS — Z9851 Tubal ligation status: Secondary | ICD-10-CM

## 2021-10-19 DIAGNOSIS — I110 Hypertensive heart disease with heart failure: Secondary | ICD-10-CM | POA: Diagnosis present

## 2021-10-19 DIAGNOSIS — Z79899 Other long term (current) drug therapy: Secondary | ICD-10-CM

## 2021-10-19 DIAGNOSIS — Z720 Tobacco use: Secondary | ICD-10-CM | POA: Diagnosis present

## 2021-10-19 DIAGNOSIS — E66813 Obesity, class 3: Secondary | ICD-10-CM

## 2021-10-19 DIAGNOSIS — G4700 Insomnia, unspecified: Secondary | ICD-10-CM | POA: Diagnosis present

## 2021-10-19 DIAGNOSIS — J123 Human metapneumovirus pneumonia: Secondary | ICD-10-CM | POA: Diagnosis present

## 2021-10-19 DIAGNOSIS — F3342 Major depressive disorder, recurrent, in full remission: Secondary | ICD-10-CM | POA: Diagnosis present

## 2021-10-19 HISTORY — DX: Acute and chronic respiratory failure with hypoxia: J96.21

## 2021-10-19 LAB — BASIC METABOLIC PANEL
Anion gap: 10 (ref 5–15)
BUN: 10 mg/dL (ref 6–20)
CO2: 29 mmol/L (ref 22–32)
Calcium: 8.4 mg/dL — ABNORMAL LOW (ref 8.9–10.3)
Chloride: 95 mmol/L — ABNORMAL LOW (ref 98–111)
Creatinine, Ser: 0.7 mg/dL (ref 0.44–1.00)
GFR, Estimated: >60 mL/min (ref >60)
Glucose, Bld: 141 mg/dL — ABNORMAL HIGH (ref 70–99)
Potassium: 3.8 mmol/L (ref 3.5–5.1)
Sodium: 134 mmol/L — ABNORMAL LOW (ref 135–145)

## 2021-10-19 LAB — RESP PANEL BY RT-PCR (FLU A&B, COVID) ARPGX2
Influenza A by PCR: NEGATIVE
Influenza B by PCR: NEGATIVE
SARS Coronavirus 2 by RT PCR: NEGATIVE

## 2021-10-19 LAB — CBC WITH DIFFERENTIAL/PLATELET
Abs Immature Granulocytes: 0.11 10*3/uL — ABNORMAL HIGH (ref 0.00–0.07)
Basophils Absolute: 0 10*3/uL (ref 0.0–0.1)
Basophils Relative: 0 %
Eosinophils Absolute: 0.5 10*3/uL (ref 0.0–0.5)
Eosinophils Relative: 4 %
HCT: 42.6 % (ref 36.0–46.0)
Hemoglobin: 13.2 g/dL (ref 12.0–15.0)
Immature Granulocytes: 1 %
Lymphocytes Relative: 17 %
Lymphs Abs: 1.9 10*3/uL (ref 0.7–4.0)
MCH: 26.8 pg (ref 26.0–34.0)
MCHC: 31 g/dL (ref 30.0–36.0)
MCV: 86.4 fL (ref 80.0–100.0)
Monocytes Absolute: 0.5 10*3/uL (ref 0.1–1.0)
Monocytes Relative: 5 %
Neutro Abs: 8.1 10*3/uL — ABNORMAL HIGH (ref 1.7–7.7)
Neutrophils Relative %: 73 %
Platelets: 216 10*3/uL (ref 150–400)
RBC: 4.93 MIL/uL (ref 3.87–5.11)
RDW: 16.4 % — ABNORMAL HIGH (ref 11.5–15.5)
WBC: 11.2 10*3/uL — ABNORMAL HIGH (ref 4.0–10.5)
nRBC: 0 % (ref 0.0–0.2)

## 2021-10-19 LAB — TROPONIN I (HIGH SENSITIVITY)
Troponin I (High Sensitivity): 9 ng/L (ref <18)
Troponin I (High Sensitivity): 12 ng/L (ref <18)

## 2021-10-19 LAB — LACTIC ACID, PLASMA
Lactic Acid, Venous: 0.8 mmol/L (ref 0.5–1.9)
Lactic Acid, Venous: 0.9 mmol/L (ref 0.5–1.9)

## 2021-10-19 LAB — TSH: TSH: 1.91 u[IU]/mL (ref 0.350–4.500)

## 2021-10-19 LAB — PROCALCITONIN: Procalcitonin: <0.1 ng/mL

## 2021-10-19 LAB — D-DIMER, QUANTITATIVE: D-Dimer, Quant: 1.01 ug/mL-FEU — ABNORMAL HIGH (ref 0.00–0.50)

## 2021-10-19 LAB — BRAIN NATRIURETIC PEPTIDE: B Natriuretic Peptide: 94.4 pg/mL (ref 0.0–100.0)

## 2021-10-19 MED ORDER — ARIPIPRAZOLE 15 MG PO TABS
7.5000 mg | ORAL_TABLET | Freq: Every day | ORAL | Status: DC
  Administered 2021-10-20 – 2021-10-22 (×3): 7.5 mg via ORAL
  Filled 2021-10-19 (×4): qty 1

## 2021-10-19 MED ORDER — IOHEXOL 350 MG/ML SOLN
75.0000 mL | Freq: Once | INTRAVENOUS | Status: AC | PRN
  Administered 2021-10-19: 75 mL via INTRAVENOUS

## 2021-10-19 MED ORDER — SODIUM CHLORIDE 0.9 % IV SOLN
2.0000 g | INTRAVENOUS | Status: DC
  Administered 2021-10-19: 2 g via INTRAVENOUS
  Filled 2021-10-19: qty 20

## 2021-10-19 MED ORDER — FUROSEMIDE 10 MG/ML IJ SOLN
60.0000 mg | Freq: Once | INTRAMUSCULAR | Status: AC
  Administered 2021-10-19: 60 mg via INTRAVENOUS
  Filled 2021-10-19: qty 8

## 2021-10-19 MED ORDER — ATORVASTATIN CALCIUM 10 MG PO TABS
10.0000 mg | ORAL_TABLET | Freq: Every day | ORAL | Status: DC
  Administered 2021-10-19 – 2021-10-22 (×4): 10 mg via ORAL
  Filled 2021-10-19 (×4): qty 1

## 2021-10-19 MED ORDER — NICOTINE 14 MG/24HR TD PT24: 14.0000 mg | MEDICATED_PATCH | Freq: Every day | TRANSDERMAL | Status: DC | PRN

## 2021-10-19 MED ORDER — ENOXAPARIN SODIUM 100 MG/ML IJ SOSY
0.5000 mg/kg | PREFILLED_SYRINGE | INTRAMUSCULAR | Status: DC
Start: 2021-10-19 — End: 2021-10-23
  Administered 2021-10-19 – 2021-10-22 (×4): 90 mg via SUBCUTANEOUS
  Filled 2021-10-19 (×5): qty 0.9

## 2021-10-19 MED ORDER — IPRATROPIUM-ALBUTEROL 0.5-2.5 (3) MG/3ML IN SOLN
3.0000 mL | Freq: Once | RESPIRATORY_TRACT | Status: DC
  Filled 2021-10-19: qty 3

## 2021-10-19 MED ORDER — ONDANSETRON HCL 4 MG PO TABS: 4.0000 mg | ORAL_TABLET | Freq: Four times a day (QID) | ORAL | Status: DC | PRN

## 2021-10-19 MED ORDER — NITROGLYCERIN 2 % TD OINT: 1.0000 [in_us] | TOPICAL_OINTMENT | Freq: Four times a day (QID) | TRANSDERMAL | Status: DC | PRN

## 2021-10-19 MED ORDER — MOMETASONE FURO-FORMOTEROL FUM 200-5 MCG/ACT IN AERO
2.0000 | INHALATION_SPRAY | Freq: Two times a day (BID) | RESPIRATORY_TRACT | Status: DC
  Administered 2021-10-20 – 2021-10-23 (×7): 2 via RESPIRATORY_TRACT
  Filled 2021-10-19 (×2): qty 8.8

## 2021-10-19 MED ORDER — SODIUM CHLORIDE 0.9 % IV SOLN
500.0000 mg | Freq: Once | INTRAVENOUS | Status: AC
  Administered 2021-10-19: 500 mg via INTRAVENOUS
  Filled 2021-10-19: qty 5

## 2021-10-19 MED ORDER — ACETAMINOPHEN 500 MG PO TABS
1000.0000 mg | ORAL_TABLET | Freq: Four times a day (QID) | ORAL | Status: AC | PRN
  Administered 2021-10-20: 1000 mg via ORAL
  Filled 2021-10-19: qty 2

## 2021-10-19 MED ORDER — FUROSEMIDE 10 MG/ML IJ SOLN
60.0000 mg | Freq: Once | INTRAMUSCULAR | Status: AC
  Administered 2021-10-20: 60 mg via INTRAVENOUS
  Filled 2021-10-19: qty 8

## 2021-10-19 MED ORDER — BUPROPION HCL ER (XL) 150 MG PO TB24
300.0000 mg | ORAL_TABLET | Freq: Every morning | ORAL | Status: DC
Start: 2021-10-20 — End: 2021-10-23
  Administered 2021-10-20 – 2021-10-23 (×4): 300 mg via ORAL
  Filled 2021-10-19 (×5): qty 2

## 2021-10-19 MED ORDER — IPRATROPIUM-ALBUTEROL 0.5-2.5 (3) MG/3ML IN SOLN
3.0000 mL | Freq: Four times a day (QID) | RESPIRATORY_TRACT | Status: AC
  Administered 2021-10-19 – 2021-10-20 (×2): 3 mL via RESPIRATORY_TRACT
  Filled 2021-10-19: qty 3

## 2021-10-19 MED ORDER — ALPRAZOLAM 0.5 MG PO TABS: 1.0000 mg | ORAL_TABLET | Freq: Three times a day (TID) | ORAL | Status: DC | PRN

## 2021-10-19 MED ORDER — ONDANSETRON HCL 4 MG/2ML IJ SOLN: 4.0000 mg | Freq: Four times a day (QID) | INTRAMUSCULAR | Status: DC | PRN

## 2021-10-19 MED ORDER — POTASSIUM CHLORIDE CRYS ER 20 MEQ PO TBCR
40.0000 meq | EXTENDED_RELEASE_TABLET | Freq: Once | ORAL | Status: AC
  Administered 2021-10-19: 40 meq via ORAL
  Filled 2021-10-19: qty 2

## 2021-10-19 MED ORDER — ENOXAPARIN SODIUM 40 MG/0.4ML IJ SOSY: 40.0000 mg | PREFILLED_SYRINGE | INTRAMUSCULAR | Status: DC

## 2021-10-19 MED ORDER — ACETAMINOPHEN 650 MG RE SUPP: 650.0000 mg | Freq: Four times a day (QID) | RECTAL | Status: AC | PRN

## 2021-10-19 MED ORDER — CARIPRAZINE HCL 1.5 MG PO CAPS
6.0000 mg | ORAL_CAPSULE | Freq: Every day | ORAL | Status: DC
  Administered 2021-10-20 – 2021-10-23 (×4): 6 mg via ORAL
  Filled 2021-10-19: qty 2
  Filled 2021-10-19: qty 4
  Filled 2021-10-19 (×3): qty 2

## 2021-10-19 MED ORDER — SODIUM CHLORIDE 0.9 % IV SOLN: 1.0000 g | Freq: Once | INTRAVENOUS | Status: DC

## 2021-10-19 MED ORDER — ZOLPIDEM TARTRATE 5 MG PO TABS
5.0000 mg | ORAL_TABLET | Freq: Every evening | ORAL | Status: DC | PRN
Start: 2021-10-19 — End: 2021-10-23

## 2021-10-19 NOTE — ED Triage Notes (Signed)
Pt via POV from home. Pt c/o SOB for the past couple of days. States that she also been having some heart palpitations. Pt is a smoker. Pt noticed some swelling in her bilateral leg for couple weeks. Denies hx of CHF/COPD.

## 2021-10-19 NOTE — Progress Notes (Signed)
PHARMACIST - PHYSICIAN COMMUNICATION  CONCERNING:  Enoxaparin (Lovenox) for DVT Prophylaxis    RECOMMENDATION: Patient was prescribed enoxaprin 40mg  q24 hours for VTE prophylaxis.   Filed Weights   10/19/21 1547  Weight: (!) 179.2 kg (395 lb)    Body mass index is 61.87 kg/m.  Estimated Creatinine Clearance: 155.5 mL/min (by C-G formula based on SCr of 0.7 mg/dL).   Based on [place] patient is candidate for enoxaparin 0.5mg /kg TBW SQ every 24 hours based on BMI being >30.  DESCRIPTION: Pharmacy has adjusted enoxaparin dose per [facility] policy.  Patient is now receiving enoxaparin 90 mg every 24 hours    Leianne Callins Rodriguez-Guzman PharmD, BCPS 10/19/2021 8:52 PM

## 2021-10-19 NOTE — Hospital Course (Signed)
Ms. Krista Mcgee is a 46 year old female with history of hepatic steatosis, elevated cholesterol in a patient less than 74 years old, morbid obesity with BMI of 62, hirsutism, history of insomnia, at risk for OSA, who presents emergency department for chief concerns of shortness of breath and chest pain.  Vital signs in the emergency department showed temperature of 98.4, respiration rate of 24, heart rate of 101, blood pressure 142/86, SPO2 initially 83% on room air, improved with 4 L nasal cannula to 94%.  Serum sodium 134, potassium 3.8, chloride 95, bicarb 29, BUN of 10, serum creatinine of 0.70, nonfasting blood glucose 141, GFR greater than 60, WBC 11.2, hemoglobin 13.2, platelets of 216.  BNP 94.4.  Troponin 9.  COVID/influenza A/influenza B PCR were negative.  D-dimer was elevated at 1.01.  CTA of the chest for PE was read as negative for PE.  Bilateral bronchopneumonia right greater than left.  ED treatment: Furosemide 60 mg IV one-time dose, potassium chloride 40 mill equivalent one-time dose, DuoNebs one-time dose.

## 2021-10-19 NOTE — Assessment & Plan Note (Signed)
-   Resumed home zolpidem (patient takes 12.5 mg daily) as 5 mg nightly as needed with the option to have an additional dose for sleep

## 2021-10-19 NOTE — Assessment & Plan Note (Signed)
-   Nicotine patch prn ordered

## 2021-10-19 NOTE — Assessment & Plan Note (Addendum)
-   Etiology work-up in progress at this time - Differentials include heart failure exacerbation versus pneumonia - Status post furosemide 40 mg IV one-time dose per EDP - Strict I's and O - Check procalcitonin, which is initially negative, discontinued antibiotics - BNP is within normal limits, suspect secondary to false negative in setting of elevated BMI of nearly 62 - DuoNebs scheduled 3 times daily - Nitroglycerin ointment as needed for chest pain - BiPAP nightly ordered

## 2021-10-19 NOTE — ED Notes (Signed)
Pt initial sat 83% on RA. Pt placed on 4L [place], O2 increased to 95% on 4L

## 2021-10-19 NOTE — Assessment & Plan Note (Signed)
-   Bupropion 300 mg every morning resumed, alprazolam 1 mg 3 times daily as needed for anxiety

## 2021-10-19 NOTE — ED Notes (Addendum)
Patient resting comfortably on stretcher watching TV. RR even and unlabored. Patient remains on oxygen at 4lpm via [place]. Patient verbalizes no needs or complaints at this time. Patient does not like to keep blood pressure cuff on so it will be checked intermittently and removed. Patient's bedside commode was emptied and cleaned. Patient had several questions regarding her admission and discharge which were answered to the best of my ability.

## 2021-10-19 NOTE — Assessment & Plan Note (Signed)
-   Atorvastatin 10 mg daily resumed 

## 2021-10-19 NOTE — H&P (Signed)
History and Physical   Krista Mcgee IRJ:[id number] DOB: Aug 13, 1976 DOA: 10/19/2021  PCP: Delsa Grana, PA-C  Patient coming from: home via pov  I have personally briefly reviewed patient's old medical records in [place].  Chief Concern: Shortness of breath and cough  HPI: Krista Mcgee is a 46 year old female with history of hepatic steatosis, elevated cholesterol in a patient less than 41 years old, morbid obesity with BMI of 62, hirsutism, history of insomnia, at risk for OSA, who presents emergency department for chief concerns of shortness of breath and chest pain.  Vital signs in the emergency department showed temperature of 98.4, respiration rate of 24, heart rate of 101, blood pressure 142/86, SPO2 initially 83% on room air, improved with 4 L nasal cannula to 94%.  Serum sodium 134, potassium 3.8, chloride 95, bicarb 29, BUN of 10, serum creatinine of 0.70, nonfasting blood glucose 141, GFR greater than 60, WBC 11.2, hemoglobin 13.2, platelets of 216.  BNP 94.4.  Troponin 9.  COVID/influenza A/influenza B PCR were negative.  D-dimer was elevated at 1.01.  CTA of the chest for PE was read as negative for PE.  Bilateral bronchopneumonia right greater than left.  ED treatment: Furosemide 60 mg IV one-time dose, potassium chloride 40 mill equivalent one-time dose, DuoNebs one-time dose.  At bedside patient is able to tell me her name, age, current calendar year.  She knew she is in the hospital.  She reports that she feels weak, increasing cough, shortness of breath for two days.  She reports the shortness of breath worse when she activity, laying flat.  She states that at baseline she can walk around the house without difficulty and now she is having increased shortness of breath. She denies fever.   She reports living with a 67 year old grandduather that had a runny nose about 1-2 weeks.  She denies known sick contacts otherwise.  She endorses chest pressure with  laying flat, coughing, and with exertion. She endorses lightheadness.  She reports that initially the cough was dry and is now increasingly become productive.  She does not know the color of the sputum.  She denies weight changes. She denies nausea, vomiting, dysuria, diarrhea, dysphagia, fever, headaches, vision chagnes. She denies rashes on her body.  She reports she has never felt this way before.  Social history: She lives with her husabnd, a Animator. She is a current smoker and vaping. She smokes 1 ppd. She vapes and recently. She infrequently drinks eoth. She denies recreatioanl drug use. She is a stay at home.  Vaccination history: She is not vaccinated for covid 19 and influenza.   ROS: Constitutional: no weight change, no fever ENT/Mouth: no sore throat, no rhinorrhea Eyes: no eye pain, no vision changes Cardiovascular: no chest pain, + dyspnea,  no edema, no palpitations Respiratory: +cough, + sputum, no wheezing Gastrointestinal: no nausea, no vomiting, no diarrhea, no constipation Genitourinary: no urinary incontinence, no dysuria, no hematuria Musculoskeletal: no arthralgias, no myalgias Skin: no skin lesions, no pruritus, Neuro: + weakness, no loss of consciousness, no syncope Psych: no anxiety, no depression, + decrease appetite Heme/Lymph: no bruising, no bleeding  ED Course: Discussed with emergency medicine provider, patient requiring hospitalization for chief concerns of acute hypoxic respiratory failure.  Assessment/Plan  Principal Problem:   Acute hypoxemic respiratory failure (HCC) Active Problems:   Insomnia   Major depressive disorder, recurrent, in full remission with anxious distress (HCC)   Shortness of breath  Hirsutism   High triglycerides   Hepatic steatosis   Morbid obesity with BMI of 60.0-69.9, adult (HCC)   Tobacco use    Respiratory * Acute hypoxemic respiratory failure (HCC) Assessment & Plan - Etiology work-up in  progress at this time - Differentials include heart failure exacerbation versus pneumonia - Status post furosemide 40 mg IV one-time dose per EDP - Strict I's and O - Check procalcitonin, which is initially negative, discontinued antibiotics - BNP is within normal limits, suspect secondary to false negative in setting of elevated BMI of nearly 62 - DuoNebs scheduled 3 times daily - Nitroglycerin ointment as needed for chest pain - BiPAP nightly ordered  Other Tobacco use Assessment & Plan - Nicotine patch prn ordered  Morbid obesity with BMI of 60.0-69.9, adult (HCC) Assessment & Plan - At risk for OSA - BiPAP nightly ordered  High triglycerides Assessment & Plan - Atorvastatin 10 mg daily resumed  Shortness of breath Assessment & Plan - With increased orthopnea and swelling of her bilateral lower extremity - Complete echo ordered  Major depressive disorder, recurrent, in full remission with anxious distress (HCC) Assessment & Plan - Bupropion 300 mg every morning resumed, alprazolam 1 mg 3 times daily as needed for anxiety  Insomnia Assessment & Plan - Resumed home zolpidem (patient takes 12.5 mg daily) as 5 mg nightly as needed with the option to have an additional dose for sleep  Chart reviewed.   DVT prophylaxis: Enoxaparin Code Status: Full code Diet: Heart healthy/carb modified Family Communication: Updated daughter at bedside Disposition Plan: Pending clinical course Consults called: None at this time Admission status: Telemetry cardiac, observation  Past Medical History:  Diagnosis Date   Anxiety    Depression    Diabetes mellitus without complication ([place])    Hypertension    Past Surgical History:  Procedure Laterality Date   CHOLECYSTECTOMY     INCONTINENCE SURGERY     OOPHORECTOMY     TUBAL LIGATION     Social History:  reports that she has been smoking. She has never used smokeless tobacco. She reports current alcohol use. She reports that she  does not use drugs.  No Known Allergies Family History  Problem Relation Age of Onset   Cancer Mother        ovarian   Other Father        tumor in his ear drum   Diabetes Father    Heart disease Father    Hyperlipidemia Father    Hypertension Father    Kidney disease Daughter        hydronephrosis   Arthritis Paternal Grandmother    COPD Paternal Grandmother    Emphysema Paternal Grandmother    Heart disease Paternal Grandmother    Cancer Paternal Grandfather        skin   COPD Paternal Grandfather    Emphysema Paternal Grandfather    Family history: Family history reviewed and not pertinent  Prior to Admission medications   Medication Sig Start Date End Date Taking? Authorizing Provider  ABILIFY 5 MG tablet TAKE 1 AND 1/2 TABLETS BY MOUTH AT BEDTIME 05/26/15   [provider]  ALPRAZolam (XANAX) 1 MG tablet TAKE 1 TABLET BY MOUTH 3 TIMES A DAY AS NEEDED FOR ANXIETY 06/26/15   [provider]  atorvastatin (LIPITOR) 10 MG tablet TAKE 1 TABLET BY MOUTH EVERYDAY AT BEDTIME 08/02/20   Delsa Grana, PA-C  budesonide-formoterol (SYMBICORT) 160-4.5 MCG/ACT inhaler Inhale 2 puffs into the lungs 2 (two)  times daily. 08/02/20   Delsa Grana, PA-C  buPROPion (WELLBUTRIN XL) 300 MG 24 hr tablet Take 300 mg by mouth every morning. 05/24/18   [provider]  empagliflozin (JARDIANCE) 10 MG TABS tablet Take 1 tablet (10 mg total) by mouth daily. 08/02/20   Delsa Grana, PA-C  metFORMIN (GLUCOPHAGE-XR) 500 MG 24 hr tablet Take 2 tablets (1,000 mg total) by mouth daily. 08/02/20   Delsa Grana, PA-C  SAPHRIS 10 MG SUBL Take 10 mg by mouth at bedtime. 02/21/17   [provider]  venlafaxine XR (EFFEXOR-XR) 75 MG 24 hr capsule Take 225 mg by mouth daily. 06/18/15   [provider]  VENTOLIN HFA 108 (90 Base) MCG/ACT inhaler INHALE 1-2 PUFFS INTO THE LUNGS EVERY 4 HOURS AS NEEDED FOR WHEEZING OR SHORTNESS OF BREATH. 08/26/21   Mecum, Erin E, PA-C  VRAYLAR  4.5 MG CAPS Take 1 capsule by mouth daily. Patient not taking: Reported on 10/19/2021 08/25/21   [provider]  VRAYLAR 6 MG CAPS Take 1 capsule by mouth daily. 09/26/21   [provider]  zolpidem (AMBIEN CR) 12.5 MG CR tablet Take 12.5 mg by mouth at bedtime. 08/25/21   [provider]   Physical Exam: Vitals:   10/19/21 1619 10/19/21 1745 10/19/21 1800 10/19/21 2052  BP:  116/64 (!) 127/94   Pulse: 96 93 92 94  Resp: (!) 23 (!) 23 (!) 22 19  Temp:      TempSrc:      SpO2: 95% 95% 96% 97%  Weight:      Height:       Constitutional: appears older than chronological age, NAD, calm, comfortable Eyes: PERRL, lids and conjunctivae normal ENMT: Mucous membranes are moist. Posterior pharynx clear of any exudate or lesions. Age-appropriate dentition. Hearing appropriate Neck: normal, supple, no masses, no thyromegaly Respiratory: clear to auscultation bilaterally, no wheezing, no crackles. Normal respiratory effort. No accessory muscle use.  Cardiovascular: Regular rate and rhythm, no murmurs / rubs / gallops. No extremity edema. 2+ pedal pulses. No carotid bruits.  Abdomen: Morbidly obese abdomen, no tenderness, no masses palpated, no hepatosplenomegaly. Bowel sounds positive.  Musculoskeletal: no clubbing / cyanosis. No joint deformity upper and lower extremities. Good ROM, no contractures, no atrophy. Normal muscle tone.  Skin: no rashes, lesions, ulcers. No induration Neurologic: Sensation intact. Strength 5/5 in all 4.  Psychiatric: Normal judgment and insight. Alert and oriented x 3. Normal mood.   EKG: independently reviewed, showing sinus rhythm with rate of 96, QTc 506  Chest x-ray on Admission: I personally reviewed and I agree with radiologist reading as below.  CT Angio Chest PE W and/or Wo Contrast  Result Date: 10/19/2021 CLINICAL DATA:  Shortness of breath and hypoxia over the last few days. Heart palpitations. Smoking history. Bilateral leg swelling.  EXAM: CT ANGIOGRAPHY CHEST WITH CONTRAST TECHNIQUE: Multidetector CT imaging of the chest was performed using the standard protocol during bolus administration of intravenous contrast. Multiplanar CT image reconstructions and MIPs were obtained to evaluate the vascular anatomy. RADIATION DOSE REDUCTION: This exam was performed according to the departmental dose-optimization program which includes automated exposure control, adjustment of the mA and/or kV according to patient size and/or use of iterative reconstruction technique. CONTRAST:  31mL OMNIPAQUE IOHEXOL 350 MG/ML SOLN COMPARISON:  Chest radiography same day.  CT angiography 03/10/2017 FINDINGS: Cardiovascular: Heart size is normal. No pericardial effusion. No visible coronary artery calcification or aortic atherosclerotic calcification. Pulmonary arterial opacification is moderate. There are no pulmonary  emboli. Mediastinum/Nodes: There is bilateral reactive hilar nodal prominence, right more than left. Lungs/Pleura: Multifocal patchy pneumonia in the right lung. Mild patchy pneumonia in the medial left lower lobe. No pleural effusion. Upper Abdomen: Fatty change of the liver. Musculoskeletal: Curvature and chronic degenerative change of the spine. Review of the MIP images confirms the above findings. IMPRESSION: No pulmonary emboli. Patchy bilateral bronchopneumonia, worse in the right lung than the left lung, with reactive hilar lymphadenopathy. Follow-up to clearing recommended. Electronically Signed   By: Nelson Chimes M.D.   On: 10/19/2021 18:40   DG Chest Port 1 View  Result Date: 10/19/2021 CLINICAL DATA:  Shortness of breath EXAM: PORTABLE CHEST 1 VIEW COMPARISON:  Previous studies including the examination of 10/31/2018 FINDINGS: Transverse diameter of heart is increased. Central pulmonary vessels are more prominent more so on the right side. There is no focal consolidation in the peripheral lung fields. There is pleural density in the lateral  aspect of right upper and right mid lung fields with no significant change. Pleural density seen in the periphery of left lung appears less prominent. Left costophrenic angle is indistinct. There is no pneumothorax. IMPRESSION: Cardiomegaly. Central pulmonary vessels are more prominent suggesting CHF. Increased interstitial markings are seen in the right parahilar region and right lower lung fields suggesting interstitial edema or interstitial pneumonitis. Possible small left pleural effusion. Pleural density in the periphery of right upper and right mid lung fields has not changed significantly. Electronically Signed   By: Elmer Picker M.D.   On: 10/19/2021 16:32    Labs on Admission: I have personally reviewed following labs  CBC: Recent Labs  Lab 10/19/21 1615  WBC 11.2*  NEUTROABS 8.1*  HGB 13.2  HCT 42.6  MCV 86.4  PLT 088   Basic Metabolic Panel: Recent Labs  Lab 10/19/21 1615  NA 134*  K 3.8  CL 95*  CO2 29  GLUCOSE 141*  BUN 10  CREATININE 0.70  CALCIUM 8.4*   GFR: Estimated Creatinine Clearance: 155.5 mL/min (by C-G formula based on SCr of 0.7 mg/dL).  Urine analysis:    Component Value Date/Time   COLORURINE YELLOW (A) 02/08/2019 0400   APPEARANCEUR HAZY (A) 02/08/2019 0400   APPEARANCEUR Turbid 12/01/2012 2149   LABSPEC 1.045 (H) 02/08/2019 0400   LABSPEC 1.028 12/01/2012 2149   PHURINE 5.0 02/08/2019 0400   GLUCOSEU NEGATIVE 02/08/2019 0400   GLUCOSEU Negative 12/01/2012 2149   HGBUR NEGATIVE 02/08/2019 0400   BILIRUBINUR NEGATIVE 02/08/2019 0400   BILIRUBINUR Negative 10/17/2018 0907   BILIRUBINUR Negative 12/01/2012 2149   KETONESUR NEGATIVE 02/08/2019 0400   PROTEINUR NEGATIVE 02/08/2019 0400   UROBILINOGEN negative (A) 10/17/2018 0907   NITRITE NEGATIVE 02/08/2019 0400   LEUKOCYTESUR TRACE (A) 02/08/2019 0400   LEUKOCYTESUR 3+ 12/01/2012 2149   Dr. Tobie Poet [facility]  If 7PM-7AM, please contact overnight-coverage provider If 7AM-7PM,  please contact day coverage provider [email or web address]  10/19/2021, 10:16 PM

## 2021-10-19 NOTE — ED Provider Notes (Signed)
[facility] Provider Note    Event Date/Time   First MD Initiated Contact with Patient 10/19/21 1601     (approximate)   History   Chest Pain and Shortness of Breath   HPI  Krista Mcgee is a 46 y.o. female  who, per PCP office note dated 08/02/2020 was being seen for left leg swelling, comes to the emergency department today because of concern for shortness of breath. It has been getting worse for the past couple of days. The patient has noticed worsening shortness of breath with exertion or lying flat. It has been accompanied by a non productive cough. The patient has also noticed swelling to her legs. The patient denies any fevers. States she has seen a pulmonologist a number of years ago and was given an inhaler at that time, but was never given the diagnosis of COPD.   Physical Exam   Triage Vital Signs: ED Triage Vitals  Enc Vitals Group     BP 10/19/21 1549 (!) 142/86     Pulse Rate 10/19/21 1549 (!) 101     Resp 10/19/21 1549 (!) 26     Temp 10/19/21 1549 98.4 F (36.9 C)     Temp Source 10/19/21 1549 Oral     SpO2 10/19/21 1549 (!) 83 %     Weight 10/19/21 1547 (!) 395 lb (179.2 kg)     Height 10/19/21 1547 5\' 7"  (1.702 m)     Head Circumference --      Peak Flow --      Pain Score 10/19/21 1547 3     Pain Loc --      Pain Edu? --      Excl. in [place]? --     Most recent vital signs: Vitals:   10/19/21 1549  BP: (!) 142/86  Pulse: (!) 101  Resp: (!) 26  Temp: 98.4 F (36.9 C)  SpO2: (!) 83%    General: Awake, no distress.  CV:  Good peripheral perfusion.  Resp:  Increased respiratory effort. Diffuse inspiratory and expiratory wheezing. Abd:  No distention.  MSK:  Bilateral lower extremity edema.     ED Results / Procedures / Treatments   Labs (all labs ordered are listed, but only abnormal results are displayed) Labs Reviewed  BASIC METABOLIC PANEL - Abnormal; Notable for the following components:      Result Value    Sodium 134 (*)    Chloride 95 (*)    Glucose, Bld 141 (*)    Calcium 8.4 (*)    All other components within normal limits  CBC WITH DIFFERENTIAL/PLATELET - Abnormal; Notable for the following components:   WBC 11.2 (*)    RDW 16.4 (*)    Neutro Abs 8.1 (*)    Abs Immature Granulocytes 0.11 (*)    All other components within normal limits  D-DIMER, QUANTITATIVE - Abnormal; Notable for the following components:   D-Dimer, Quant 1.01 (*)    All other components within normal limits  RESP PANEL BY RT-PCR (FLU A&B, COVID) ARPGX2  CULTURE, BLOOD (ROUTINE X 2)  CULTURE, BLOOD (ROUTINE X 2)  BRAIN NATRIURETIC PEPTIDE  LACTIC ACID, PLASMA  LACTIC ACID, PLASMA  HIV ANTIBODY (ROUTINE TESTING W REFLEX)  BASIC METABOLIC PANEL  CBC  TSH  PROCALCITONIN  PROCALCITONIN  POC URINE PREG, ED  TROPONIN I (HIGH SENSITIVITY)  TROPONIN I (HIGH SENSITIVITY)     EKG  I, Nance Pear, attending physician, personally viewed and interpreted this  EKG  EKG Time: 1557 Rate: 96 Rhythm: sinus rhythm Axis: normal Intervals: qtc 506 QRS: narrow, q waves v1 ST changes: no st elevation Impression: abnormal ekg    RADIOLOGY CXR I independently interpreted and visualized the CXR. My interpretation: Cardiomegaly Radiology interpretation:  IMPRESSION:  Cardiomegaly. Central pulmonary vessels are more prominent  suggesting CHF. Increased interstitial markings are seen in the  right parahilar region and right lower lung fields suggesting  interstitial edema or interstitial pneumonitis. Possible small left  pleural effusion. Pleural density in the periphery of right upper  and right mid lung fields has not changed significantly.   CT angio PE I independently interpreted and visualized the ct angio. My interpretation: No large PE. No pneumothorax Radiology interpretation:  IMPRESSION:  No pulmonary emboli.     Patchy bilateral bronchopneumonia, worse in the right lung than the  left lung, with  reactive hilar lymphadenopathy. Follow-up to  clearing recommended.       PROCEDURES:  Critical Care performed: No  Procedures   MEDICATIONS ORDERED IN ED: Medications - No data to display   IMPRESSION / MDM / [place] / ED COURSE  I reviewed the triage vital signs and the nursing notes.                              Differential diagnosis includes, but is not limited to, COPD, CHF, pneumonia, pneumothorax, viral disease.  Patient presented to the emergency department today because of concerns for shortness of breath.  Patient was found to be hypoxic on room air.  Says been present for the past couple days.  Did have concerns for heart failure given patient's reported orthopnea and swelling.  Because of this Lasix was ordered.  Patient's chest x-ray was consistent with CHF with enlarged heart and findings consistent with edema.  Given hypoxia and swelling did also send a D-dimer which was elevated.  Because of this a PE study was performed.  While this did not show PE they were concerned for possible bronchopneumonia.  Patient was started on IV antibiotics.  Did discuss findings with patient.  I discussed with the hospitalist, Dr. Tobie Poet who will plan on admission.  FINAL CLINICAL IMPRESSION(S) / ED DIAGNOSES   Final diagnoses:  Shortness of breath  Hypoxia     Note:  This document was prepared using Dragon voice recognition software and may include unintentional dictation errors.    Nance Pear, MD 10/19/21 [phone]

## 2021-10-19 NOTE — Assessment & Plan Note (Signed)
-   With increased orthopnea and swelling of her bilateral lower extremity - Complete echo ordered

## 2021-10-19 NOTE — Assessment & Plan Note (Signed)
-   At risk for OSA - BiPAP nightly ordered

## 2021-10-20 ENCOUNTER — Observation Stay (HOSPITAL_COMMUNITY)
Admit: 2021-10-20 | Discharge: 2021-10-20 | Disposition: A | Discharge status: Home / Self Care | Payer: Medicaid Other | Attending: Internal Medicine | Admitting: Internal Medicine

## 2021-10-20 DIAGNOSIS — E662 Morbid (severe) obesity with alveolar hypoventilation: Secondary | ICD-10-CM | POA: Diagnosis present

## 2021-10-20 DIAGNOSIS — F17210 Nicotine dependence, cigarettes, uncomplicated: Secondary | ICD-10-CM | POA: Diagnosis present

## 2021-10-20 DIAGNOSIS — E7800 Pure hypercholesterolemia, unspecified: Secondary | ICD-10-CM | POA: Diagnosis present

## 2021-10-20 DIAGNOSIS — Z28310 Unvaccinated for covid-19: Secondary | ICD-10-CM | POA: Diagnosis not present

## 2021-10-20 DIAGNOSIS — I5033 Acute on chronic diastolic (congestive) heart failure: Secondary | ICD-10-CM | POA: Diagnosis present

## 2021-10-20 DIAGNOSIS — J123 Human metapneumovirus pneumonia: Secondary | ICD-10-CM | POA: Diagnosis present

## 2021-10-20 DIAGNOSIS — R0602 Shortness of breath: Secondary | ICD-10-CM | POA: Diagnosis present

## 2021-10-20 DIAGNOSIS — R0609 Other forms of dyspnea: Secondary | ICD-10-CM | POA: Diagnosis not present

## 2021-10-20 DIAGNOSIS — I509 Heart failure, unspecified: Secondary | ICD-10-CM

## 2021-10-20 DIAGNOSIS — F3342 Major depressive disorder, recurrent, in full remission: Secondary | ICD-10-CM | POA: Diagnosis present

## 2021-10-20 DIAGNOSIS — J9622 Acute and chronic respiratory failure with hypercapnia: Secondary | ICD-10-CM | POA: Diagnosis present

## 2021-10-20 DIAGNOSIS — F17290 Nicotine dependence, other tobacco product, uncomplicated: Secondary | ICD-10-CM | POA: Diagnosis present

## 2021-10-20 DIAGNOSIS — J9612 Chronic respiratory failure with hypercapnia: Secondary | ICD-10-CM | POA: Diagnosis not present

## 2021-10-20 DIAGNOSIS — Z7984 Long term (current) use of oral hypoglycemic drugs: Secondary | ICD-10-CM | POA: Diagnosis not present

## 2021-10-20 DIAGNOSIS — I110 Hypertensive heart disease with heart failure: Secondary | ICD-10-CM | POA: Diagnosis present

## 2021-10-20 DIAGNOSIS — E119 Type 2 diabetes mellitus without complications: Secondary | ICD-10-CM | POA: Diagnosis present

## 2021-10-20 DIAGNOSIS — Z7951 Long term (current) use of inhaled steroids: Secondary | ICD-10-CM | POA: Diagnosis not present

## 2021-10-20 DIAGNOSIS — Z20822 Contact with and (suspected) exposure to covid-19: Secondary | ICD-10-CM | POA: Diagnosis present

## 2021-10-20 DIAGNOSIS — J9621 Acute and chronic respiratory failure with hypoxia: Secondary | ICD-10-CM | POA: Diagnosis present

## 2021-10-20 DIAGNOSIS — Z79899 Other long term (current) drug therapy: Secondary | ICD-10-CM | POA: Diagnosis not present

## 2021-10-20 DIAGNOSIS — G4700 Insomnia, unspecified: Secondary | ICD-10-CM | POA: Diagnosis present

## 2021-10-20 DIAGNOSIS — L680 Hirsutism: Secondary | ICD-10-CM | POA: Diagnosis present

## 2021-10-20 DIAGNOSIS — E781 Pure hyperglyceridemia: Secondary | ICD-10-CM | POA: Diagnosis present

## 2021-10-20 DIAGNOSIS — E876 Hypokalemia: Secondary | ICD-10-CM | POA: Diagnosis present

## 2021-10-20 DIAGNOSIS — F419 Anxiety disorder, unspecified: Secondary | ICD-10-CM | POA: Diagnosis present

## 2021-10-20 DIAGNOSIS — K760 Fatty (change of) liver, not elsewhere classified: Secondary | ICD-10-CM | POA: Diagnosis present

## 2021-10-20 DIAGNOSIS — Z9851 Tubal ligation status: Secondary | ICD-10-CM | POA: Diagnosis not present

## 2021-10-20 DIAGNOSIS — Z6841 Body Mass Index (BMI) 40.0 and over, adult: Secondary | ICD-10-CM | POA: Diagnosis not present

## 2021-10-20 DIAGNOSIS — J9601 Acute respiratory failure with hypoxia: Secondary | ICD-10-CM | POA: Diagnosis not present

## 2021-10-20 HISTORY — DX: Acute respiratory failure with hypoxia: J96.01

## 2021-10-20 LAB — BASIC METABOLIC PANEL
Anion gap: 16 — ABNORMAL HIGH (ref 5–15)
BUN: 11 mg/dL (ref 6–20)
CO2: 28 mmol/L (ref 22–32)
Calcium: 8.7 mg/dL — ABNORMAL LOW (ref 8.9–10.3)
Chloride: 92 mmol/L — ABNORMAL LOW (ref 98–111)
Creatinine, Ser: 0.78 mg/dL (ref 0.44–1.00)
GFR, Estimated: >60 mL/min (ref >60)
Glucose, Bld: 126 mg/dL — ABNORMAL HIGH (ref 70–99)
Potassium: 4.3 mmol/L (ref 3.5–5.1)
Sodium: 136 mmol/L (ref 135–145)

## 2021-10-20 LAB — ECHOCARDIOGRAM COMPLETE
Area-P 1/2: 3.42 cm2
Height: 67 in
Weight: 6320 oz

## 2021-10-20 LAB — CBC
HCT: 44.5 % (ref 36.0–46.0)
Hemoglobin: 13.6 g/dL (ref 12.0–15.0)
MCH: 26.9 pg (ref 26.0–34.0)
MCHC: 30.6 g/dL (ref 30.0–36.0)
MCV: 88.1 fL (ref 80.0–100.0)
Platelets: 206 10*3/uL (ref 150–400)
RBC: 5.05 MIL/uL (ref 3.87–5.11)
RDW: 16.5 % — ABNORMAL HIGH (ref 11.5–15.5)
WBC: 10.5 10*3/uL (ref 4.0–10.5)
nRBC: 0 % (ref 0.0–0.2)

## 2021-10-20 LAB — HIV ANTIBODY (ROUTINE TESTING W REFLEX): HIV Screen 4th Generation wRfx: NONREACTIVE

## 2021-10-20 LAB — PROCALCITONIN: Procalcitonin: <0.1 ng/mL

## 2021-10-20 MED ORDER — FUROSEMIDE 10 MG/ML IJ SOLN
40.0000 mg | Freq: Two times a day (BID) | INTRAMUSCULAR | Status: DC
  Administered 2021-10-20 – 2021-10-23 (×6): 40 mg via INTRAVENOUS
  Filled 2021-10-20 (×6): qty 4

## 2021-10-20 MED ORDER — PERFLUTREN LIPID MICROSPHERE
1.0000 mL | INTRAVENOUS | Status: AC | PRN
  Administered 2021-10-20: 3 mL via INTRAVENOUS
  Filled 2021-10-20: qty 10

## 2021-10-20 NOTE — Assessment & Plan Note (Addendum)
The patient presented with respiratory distress, tachypnea, accesssory muscle use and hypoxia with Sao2 of 83% on room air.  She required 4L O2 by [place] on admission.   She is currently saturating at 90% on 2L   There is no evidence for CHF as an etiology. Echocardiogram is unremarkable and demonstrated an EF of 55-60%. Diastolic function is indeterminate. RV size and function are normal.  CT Chest demonstrated a patchy bilateral bronchopneumonia Right > Left with reactive hilar lymphadenopathy.   The patient was without WBC elevation. Procalcitonin was <0.10. So no real evidence for bacterial pneumonia.  Viral respiratory panel was positive for metapneumovirus. The patient has a viral pneumonia.

## 2021-10-20 NOTE — ED Notes (Signed)
Patient's monitor began ringing off because her oxygen sats dropped to 72% sustained with a good pleth. This nurse went to bedside immediately to assess the patient and found her sound asleep. I woke her and it took several minutes for her sats to get above 90% again. Patient was at 4lpm oxygen via [place]. I placed the patient on a NRB at 10lpm while I contact the provider. I am concerned that when the patient goes back to sleep her sats will drop into the 70s again. Message has been sent to the provider and I am awaiting further.

## 2021-10-20 NOTE — Assessment & Plan Note (Signed)
Noted. Likely related to super morbid obesity. Recommend sensible weight loss with dietary modification and increase of activity.

## 2021-10-20 NOTE — Assessment & Plan Note (Signed)
Noted. Continue Lipitor as at home. Recommend a low fat diet.

## 2021-10-20 NOTE — ED Notes (Signed)
Bed rail lowered and bedside commode placed bedside bed.

## 2021-10-20 NOTE — Assessment & Plan Note (Signed)
Noted. Complicates all cares. Strongly recommend sensible weight loss through dietary modification and increase of physical activity.

## 2021-10-20 NOTE — ED Notes (Signed)
Patient resting comfortably on stretcher in room with eyes closed. RR even and unlabored while on Bipap. Patient in no obvious pain or distress. Patient does not verbalize any needs or complaints at this time.

## 2021-10-20 NOTE — ED Notes (Signed)
Patient is resting comfortably on stretcher with eyes closed and remains on Bipap. Patient appears to be tolerating it well. Her sats have returned to 94% while sleeping. Patient verbalizes no needs or complaints at this time.

## 2021-10-20 NOTE — Assessment & Plan Note (Signed)
Smokes cigaretts and vapes. Strongly recommend cessation of both. This is certainly complicating management of her current illness.

## 2021-10-20 NOTE — ED Notes (Signed)
Provider has ordered qHS Bipap on this patient. Respiratory has been notified.

## 2021-10-20 NOTE — ED Notes (Signed)
Informed RN bed assigned 

## 2021-10-20 NOTE — Progress Notes (Signed)
*  PRELIMINARY RESULTS* Echocardiogram 2D Echocardiogram has been performed.  Wallie Char Baneza Bartoszek 10/20/2021, 11:42 AM

## 2021-10-20 NOTE — ED Notes (Signed)
Patient resting comfortably on stretcher in room with eyes closed. RR even and unlabored. Patient remains on oxygen at 4lpm via [place]. Oxygen sats remain 94-96%. Patient verbalizes no needs or complaints.

## 2021-10-20 NOTE — ED Notes (Signed)
While still awaiting word from admitting provider, I have notified respiratory to bring a high flow nasal cannula. Will attempt this and see how she does.

## 2021-10-20 NOTE — Assessment & Plan Note (Signed)
Present on CT abdomen and pelvis. Likely due to BMI of 61.87.

## 2021-10-20 NOTE — Assessment & Plan Note (Signed)
Noted. Continue Wellbutrin, Vraylar, and xanax.

## 2021-10-20 NOTE — ED Notes (Signed)
Patient resting comfortably on stretcher in room. RR even and unlabored. Patient remains on oxygen at 4lpm via [place]. Oxygen sats remain 94-96%. Patient verbalizes no needs or complaints at this time. Patient still does not want to leave her blood pressure cuff on. Patient's daughter and boyfriend are at bedside.

## 2021-10-20 NOTE — Assessment & Plan Note (Signed)
Noted. Continue Ambien as at home.

## 2021-10-20 NOTE — Assessment & Plan Note (Addendum)
No real evidence for CHF. Echocardiogram demonstrates an EF of 55-60%. No wall motion abnormalities. Diastolic parameters were indeterminate. Right ventricle was normal is size and systolic function.

## 2021-10-20 NOTE — Progress Notes (Signed)
PROGRESS NOTE  Krista Mcgee TGG:[id number] DOB: 02/25/1976 DOA: 10/19/2021 PCP: Delsa Grana, PA-C  Brief History   The patient is a 46 yr old woman with a BMI of 61.87. She presented to [facility] ED on 10/19/2021 with complaints of shortness of breath and chest pain. She was found to be hypoxic with Sao2 of 83% on room air and tachypnea. EKG was negative for ischemic changes. Troponin x 3 were negative.   CT chest demonstrated bilateral patchy infiltrates suggestive of broncho-pneumonia right greater than left with reactive hilar lymphadenopathy. However, the patient has no fever, no leukocytosis, and a negative procalcitonin. CXR suggests pulmonary edema and pulmonary vascular congestion.   In the ED the patient was given lasiix 60 mg IV x 1. She was also given Rocephin and azithromycin.   [facility] hospital[facility] were consulted to admit the patient for further evaluation and treatment.  The patient was admitted to a telemetry bed. She underwent echocardiogram that demonstrated EF of 55-60% with indeterminate diastolic function, normal RV size and systolic function. No valvular abnormalities.   Viral respiratory panel has been ordered.  Consultants  None  Procedures  None  Antibiotics   Anti-infectives (From admission, onward)    Start     Dose/Rate Route Frequency Ordered Stop   10/19/21 2100  cefTRIAXone (ROCEPHIN) 2 g in sodium chloride 0.9 % 100 mL IVPB  Status:  Discontinued        2 g 200 mL/hr over 30 Minutes Intravenous Every 24 hours 10/19/21 2008 10/19/21 2203   10/19/21 2000  azithromycin (ZITHROMAX) 500 mg in sodium chloride 0.9 % 250 mL IVPB        500 mg 250 mL/hr over 60 Minutes Intravenous  Once 10/19/21 1946 10/19/21 2146   10/19/21 2000  cefTRIAXone (ROCEPHIN) 1 g in sodium chloride 0.9 % 100 mL IVPB  Status:  Discontinued        1 g 200 mL/hr over 30 Minutes Intravenous  Once 10/19/21 1946 10/19/21 2004      Subjective  The patient is resting comfortably. No new  complaints.  Objective   Vitals:  Vitals:   10/20/21 1515 10/20/21 1630  BP: 126/74 102/88  Pulse: 75 82  Resp: 19   Temp: 98.5 F (36.9 C) 98.4 F (36.9 C)  SpO2: 96% 97%    Exam:  Constitutional:  The patient is awake, alert, and oriented x 3. No acute distress. Respiratory:  No increased work of breathing. Positive for wheezes and rales No rhonchi No tactile fremitus Cardiovascular:  Regular rate and rhythm No murmurs, ectopy, or gallups. No lateral PMI. No thrills. Abdomen:  Abdomen is soft, non-tender, non-distended No hernias, masses, or organomegaly Normoactive bowel sounds.  Musculoskeletal:  No cyanosis, clubbing, or edema Skin:  No rashes, lesions, ulcers palpation of skin: no induration or nodules Neurologic:  CN 2-12 intact Sensation all 4 extremities intact Psychiatric:  Mental status Mood, affect appropriate Orientation to person, place, time  judgment and insight appear intact   I have personally reviewed the following:   Today's Data  Vitals  Lab Data  CBC BMP Troponin Lactic acid  Micro Data  Blood culture x 2 has had no growth  Imaging  CXR CTA chest  Cardiology Data  EKG Echocardiogram  Other Data    Scheduled Meds:  ARIPiprazole  7.5 mg Oral QHS   atorvastatin  10 mg Oral QHS   buPROPion  300 mg Oral q morning   cariprazine  6 mg Oral Daily  enoxaparin (LOVENOX) injection  0.5 mg/kg Subcutaneous Q24H   furosemide  40 mg Intravenous Q12H   mometasone-formoterol  2 puff Inhalation BID    Principal Problem:   Acute hypoxemic respiratory failure (HCC) Active Problems:   Insomnia   Major depressive disorder, recurrent, in full remission with anxious distress (HCC)   Hirsutism   High triglycerides   Hepatic steatosis   Morbid obesity with BMI of 60.0-69.9, adult (HCC)   Tobacco use   Acute exacerbation of CHF (congestive heart failure) ([place])   LOS: 0 days   A & P  Assessment and Plan: * Acute hypoxemic  respiratory failure ([place])- (present on admission) The patient presented with respiratory distress, tachypnea, accesssory muscle use and hypoxia with Sao2 of 83% on room air.  She is currently saturating at 97% on room air.   Etiology is uncertain. Procalcitonin is less than 0.10. She has no fever or leukocytosis. Will not continue antibiotics as I believe that bacterial etiology is unlikely.  Pneumonitis due to vaping is a possibility. There is likely some element of HFPEF. Will continued lasix. Nebulizer treatments will be available.  Viral respiratory panel has been ordered.  Consider pulmonology consult if patient does not improve.  Acute exacerbation of CHF (congestive heart failure) (HCC) HFPEF. The patient's CXR on admission suggested CHF exacerbation with interstitial edema and pulmonary vascular congestion. The patient received lasix 60 mg in the ED. Echocardiogram has been performed. It has demonstrated EF of 55-60% with indeterminate diastolic parameters, and normal RV systolic function and size. Continue diuresis.  Tobacco use- (present on admission) Smokes cigaretts and vapes. Strongly recommend cessation of both. This is certainly complicating management of her current illness.   Morbid obesity with BMI of 60.0-69.9, adult ([place]) Noted. Complicates all cares. Strongly recommend sensible weight loss through dietary modification and increase of physical activity.  Hepatic steatosis- (present on admission) Present on CT abdomen and pelvis. Likely due to BMI of 61.87.  High triglycerides- (present on admission) Noted. Continue Lipitor as at home. Recommend a low fat diet.   Hirsutism- (present on admission) Noted. Likely related to super morbid obesity. Recommend sensible weight loss with dietary modification and increase of activity.  Major depressive disorder, recurrent, in full remission with anxious distress ([place])- (present on admission) Noted. Continue Wellbutrin,  Vraylar, and xanax.   Insomnia- (present on admission) Noted. Continue Ambien as at home.   I have seen and examined this patient myself. I have spent 38 minutes in her evaluation and care.  DVT prophylaxis: Lovenox Code Status: Full Code Family Communication: Family is at bedside Disposition Plan: tbd    Jenavee Laguardia, DO [facility] Direct contact: see [email or web address]  7PM-7AM contact night coverage as above 10/20/2021, 5:19 PM  LOS: 0 days

## 2021-10-20 NOTE — ED Notes (Signed)
Hospitalist at bedside 

## 2021-10-20 NOTE — ED Notes (Signed)
Patient resting comfortably on stretcher in room with eyes closed. RR even and unlabored. Patient remains on oxygen at 4lpm via [place]. Oxygen sats remain 94-96%. Patient verbalizes no needs or complaints.

## 2021-10-20 NOTE — ED Notes (Signed)
Pt moved from 32 to 5 and placed back on 4L [place].

## 2021-10-21 DIAGNOSIS — J9601 Acute respiratory failure with hypoxia: Secondary | ICD-10-CM | POA: Diagnosis not present

## 2021-10-21 DIAGNOSIS — J129 Viral pneumonia, unspecified: Secondary | ICD-10-CM

## 2021-10-21 HISTORY — DX: Viral pneumonia, unspecified: J12.9

## 2021-10-21 LAB — RESPIRATORY PANEL BY PCR

## 2021-10-21 LAB — PROCALCITONIN: Procalcitonin: <0.1 ng/mL

## 2021-10-21 NOTE — Progress Notes (Signed)
°  Transition of Care [facility]) Screening Note   Patient Details  Name: BRIAH NARY Date of Birth: [birth year]   Transition of [facility]) CM/SW Contact:    Alberteen Sam, LCSW Phone Number: 10/21/2021, 8:47 AM    Transition of Care Department [facility]) has reviewed patient and no TOC needs have been identified at this time. We will continue to monitor patient advancement through interdisciplinary progression rounds. If new patient transition needs arise, please place a TOC consult.  [place], [place]

## 2021-10-21 NOTE — Progress Notes (Signed)
PROGRESS NOTE  Krista Mcgee BWI:[id number] DOB: 1976-06-28 DOA: 10/19/2021 PCP: Delsa Grana, PA-C  Brief History   The patient is a 46 yr old woman with a BMI of 61.87. She presented to [facility] ED on 10/19/2021 with complaints of shortness of breath and chest pain. She was found to be hypoxic with Sao2 of 83% on room air and tachypnea. EKG was negative for ischemic changes. Troponin x 3 were negative.   CT chest demonstrated bilateral patchy infiltrates suggestive of broncho-pneumonia right greater than left with reactive hilar lymphadenopathy. However, the patient has no fever, no leukocytosis, and a negative procalcitonin. CXR suggests pulmonary edema and pulmonary vascular congestion.   In the ED the patient was given lasiix 60 mg IV x 1. She was also given Rocephin and azithromycin.   [facility] were consulted to admit the patient for further evaluation and treatment.  The patient was admitted to a telemetry bed. She underwent echocardiogram that demonstrated EF of 55-60% with indeterminate diastolic function, normal RV size and systolic function. No valvular abnormalities.   Viral respiratory panel is positive for metapneumovirus.  Consultants  None  Procedures  None  Antibiotics   Anti-infectives (From admission, onward)    Start     Dose/Rate Route Frequency Ordered Stop   10/19/21 2100  cefTRIAXone (ROCEPHIN) 2 g in sodium chloride 0.9 % 100 mL IVPB  Status:  Discontinued        2 g 200 mL/hr over 30 Minutes Intravenous Every 24 hours 10/19/21 2008 10/19/21 2203   10/19/21 2000  azithromycin (ZITHROMAX) 500 mg in sodium chloride 0.9 % 250 mL IVPB        500 mg 250 mL/hr over 60 Minutes Intravenous  Once 10/19/21 1946 10/19/21 2146   10/19/21 2000  cefTRIAXone (ROCEPHIN) 1 g in sodium chloride 0.9 % 100 mL IVPB  Status:  Discontinued        1 g 200 mL/hr over 30 Minutes Intravenous  Once 10/19/21 1946 10/19/21 2004      Subjective  The patient is resting  comfortably. No new complaints.  Objective   Vitals:  Vitals:   10/21/21 0457 10/21/21 0500  BP: 127/69 124/74  Pulse: 95   Resp: 20   Temp:    SpO2: 90%     Exam:  Constitutional:  The patient is awake, alert, and oriented x 3. No acute distress. Respiratory:  No increased work of breathing. Positive for wheezes and rales No rhonchi No tactile fremitus Cardiovascular:  Regular rate and rhythm No murmurs, ectopy, or gallups. No lateral PMI. No thrills. Abdomen:  Abdomen is soft, non-tender, non-distended No hernias, masses, or organomegaly Normoactive bowel sounds.  Musculoskeletal:  No cyanosis, clubbing, or edema Skin:  No rashes, lesions, ulcers palpation of skin: no induration or nodules Neurologic:  CN 2-12 intact Sensation all 4 extremities intact Psychiatric:  Mental status Mood, affect appropriate Orientation to person, place, time  judgment and insight appear intact   I have personally reviewed the following:   Today's Data  Vitals  Lab Data  CBC BMP Troponin Lactic acid  Micro Data  Blood culture x 2 has had no growth  Imaging  CXR CTA chest  Cardiology Data  EKG Echocardiogram  Other Data    Scheduled Meds:  ARIPiprazole  7.5 mg Oral QHS   atorvastatin  10 mg Oral QHS   buPROPion  300 mg Oral q morning   cariprazine  6 mg Oral Daily   enoxaparin (LOVENOX) injection  0.5 mg/kg Subcutaneous Q24H   furosemide  40 mg Intravenous Q12H   mometasone-formoterol  2 puff Inhalation BID    Principal Problem:   Acute hypoxemic respiratory failure (HCC) Active Problems:   Insomnia   Major depressive disorder, recurrent, in full remission with anxious distress (HCC)   Hirsutism   High triglycerides   Hepatic steatosis   Morbid obesity with BMI of 60.0-69.9, adult (HCC)   Tobacco use   Acute exacerbation of CHF (congestive heart failure) ([place])   Viral pneumonia   LOS: 1 day   A & P  Assessment and Plan: * Acute hypoxemic  respiratory failure ([place])- (present on admission) The patient presented with respiratory distress, tachypnea, accesssory muscle use and hypoxia with Sao2 of 83% on room air.  She required 4L O2 by [place] on admission.   She is currently saturating at 90% on 2L   There is no evidence for CHF as an etiology. Echocardiogram is unremarkable and demonstrated an EF of 55-60%. Diastolic function is indeterminate. RV size and function are normal.  CT Chest demonstrated a patchy bilateral bronchopneumonia Right > Left with reactive hilar lymphadenopathy.   The patient was without WBC elevation. Procalcitonin was <0.10. So no real evidence for bacterial pneumonia.  Viral respiratory panel was positive for metapneumovirus. The patient has a viral pneumonia.  Viral pneumonia- (present on admission) The patient presented with respiratory distress, tachypnea, accesssory muscle use and hypoxia with Sao2 of 83% on room air.  She required 4L O2 by [place] on admission.   She is currently saturating at 90% on 2L   There is no evidence for CHF as an etiology. Echocardiogram is unremarkable and demonstrated an EF of 55-60%. Diastolic function is indeterminate. RV size and function are normal.  CT Chest demonstrated a patchy bilateral bronchopneumonia Right > Left with reactive hilar lymphadenopathy.   The patient was without WBC elevation. Procalcitonin was <0.10. So no real evidence for bacterial pneumonia.  Viral respiratory panel was positive for metapneumovirus. The patient has a viral pneumonia.  Acute exacerbation of CHF (congestive heart failure) (HCC) No real evidence for CHF. Echocardiogram demonstrates an EF of 55-60%. No wall motion abnormalities. Diastolic parameters were indeterminate. Right ventricle was normal is size and systolic function.   Tobacco use- (present on admission) Smokes cigaretts and vapes. Strongly recommend cessation of both. This is certainly complicating management of her  current illness.   Morbid obesity with BMI of 60.0-69.9, adult ([place]) Noted. Complicates all cares. Strongly recommend sensible weight loss through dietary modification and increase of physical activity.  Hepatic steatosis- (present on admission) Present on CT abdomen and pelvis. Likely due to BMI of 61.87.  High triglycerides- (present on admission) Noted. Continue Lipitor as at home. Recommend a low fat diet.   Hirsutism- (present on admission) Noted. Likely related to super morbid obesity. Recommend sensible weight loss with dietary modification and increase of activity.  Major depressive disorder, recurrent, in full remission with anxious distress ([place])- (present on admission) Noted. Continue Wellbutrin, Vraylar, and xanax.   Insomnia- (present on admission) Noted. Continue Ambien as at home.   I have seen and examined this patient myself. I have spent 38 minutes in her evaluation and care.  DVT prophylaxis: Lovenox Code Status: Full Code Family Communication: Family is at bedside Disposition Plan: tbd    Draedyn Weidinger, DO [facility] Direct contact: see [email or web address]  7PM-7AM contact night coverage as above 10/21/2021, 4:01 PM  LOS: 0 days

## 2021-10-21 NOTE — Assessment & Plan Note (Signed)
The patient presented with respiratory distress, tachypnea, accesssory muscle use and hypoxia with Sao2 of 83% on room air.  She required 4L O2 by [place] on admission.   She is currently saturating at 90% on 2L   There is no evidence for CHF as an etiology. Echocardiogram is unremarkable and demonstrated an EF of 55-60%. Diastolic function is indeterminate. RV size and function are normal.  CT Chest demonstrated a patchy bilateral bronchopneumonia Right > Left with reactive hilar lymphadenopathy.   The patient was without WBC elevation. Procalcitonin was <0.10. So no real evidence for bacterial pneumonia.  Viral respiratory panel was positive for metapneumovirus. The patient has a viral pneumonia.

## 2021-10-22 ENCOUNTER — Encounter: Payer: Self-pay | Admitting: Internal Medicine

## 2021-10-22 DIAGNOSIS — I5033 Acute on chronic diastolic (congestive) heart failure: Secondary | ICD-10-CM

## 2021-10-22 DIAGNOSIS — J9621 Acute and chronic respiratory failure with hypoxia: Principal | ICD-10-CM

## 2021-10-22 DIAGNOSIS — J9612 Chronic respiratory failure with hypercapnia: Secondary | ICD-10-CM | POA: Diagnosis present

## 2021-10-22 DIAGNOSIS — J9611 Chronic respiratory failure with hypoxia: Secondary | ICD-10-CM | POA: Diagnosis present

## 2021-10-22 LAB — BASIC METABOLIC PANEL
Anion gap: 12 (ref 5–15)
BUN: 20 mg/dL (ref 6–20)
CO2: 37 mmol/L — ABNORMAL HIGH (ref 22–32)
Calcium: 9.6 mg/dL (ref 8.9–10.3)
Chloride: 88 mmol/L — ABNORMAL LOW (ref 98–111)
Creatinine, Ser: 0.8 mg/dL (ref 0.44–1.00)
GFR, Estimated: >60 mL/min (ref >60)
Glucose, Bld: 175 mg/dL — ABNORMAL HIGH (ref 70–99)
Potassium: 3.4 mmol/L — ABNORMAL LOW (ref 3.5–5.1)
Sodium: 137 mmol/L (ref 135–145)

## 2021-10-22 LAB — BLOOD GAS, ARTERIAL
Acid-Base Excess: 21.1 mmol/L — ABNORMAL HIGH (ref 0.0–2.0)
Bicarbonate: 47.9 mmol/L — ABNORMAL HIGH (ref 20.0–28.0)
FIO2: 0.28 %
O2 Saturation: 95.7 %
Patient temperature: 37
pCO2 arterial: 60 mmHg — ABNORMAL HIGH (ref 32–48)
pH, Arterial: 7.51 — ABNORMAL HIGH (ref 7.35–7.45)
pO2, Arterial: 73 mmHg — ABNORMAL LOW (ref 83–108)

## 2021-10-22 LAB — CBC WITH DIFFERENTIAL/PLATELET
Abs Immature Granulocytes: 0.06 10*3/uL (ref 0.00–0.07)
Basophils Absolute: 0.1 10*3/uL (ref 0.0–0.1)
Basophils Relative: 1 %
Eosinophils Absolute: 0.5 10*3/uL (ref 0.0–0.5)
Eosinophils Relative: 5 %
HCT: 47.5 % — ABNORMAL HIGH (ref 36.0–46.0)
Hemoglobin: 14.9 g/dL (ref 12.0–15.0)
Immature Granulocytes: 1 %
Lymphocytes Relative: 28 %
Lymphs Abs: 3 10*3/uL (ref 0.7–4.0)
MCH: 26.7 pg (ref 26.0–34.0)
MCHC: 31.4 g/dL (ref 30.0–36.0)
MCV: 85.1 fL (ref 80.0–100.0)
Monocytes Absolute: 0.5 10*3/uL (ref 0.1–1.0)
Monocytes Relative: 4 %
Neutro Abs: 6.8 10*3/uL (ref 1.7–7.7)
Neutrophils Relative %: 61 %
Platelets: 235 10*3/uL (ref 150–400)
RBC: 5.58 MIL/uL — ABNORMAL HIGH (ref 3.87–5.11)
RDW: 16.1 % — ABNORMAL HIGH (ref 11.5–15.5)
WBC: 11 10*3/uL — ABNORMAL HIGH (ref 4.0–10.5)
nRBC: 0 % (ref 0.0–0.2)

## 2021-10-22 NOTE — Progress Notes (Addendum)
Progress Note    Krista Mcgee  EXH:[id number] DOB: May 03, 1976  DOA: 10/19/2021 PCP: Delsa Grana, PA-C      Brief Narrative:    Medical records reviewed and are as summarized below:  Krista Mcgee is a 46 y.o. female with medical history significant for morbid obesity, hypertension, type II DM, depression, anxiety, presented to the hospital because of shortness of breath.  She was found to have oxygen saturation of 83% on room air and bronchopneumonia.  Respiratory viral panel showed metapneumovirus which was likely the cause of her pneumonia.     Assessment/Plan:   Principal Problem:   Acute on chronic respiratory failure with hypoxia (HCC) Active Problems:   Insomnia   Major depressive disorder, recurrent, in full remission with anxious distress (HCC)   Hirsutism   High triglycerides   Hepatic steatosis   Morbid obesity with BMI of 60.0-69.9, adult (HCC)   Tobacco use   Acute exacerbation of CHF (congestive heart failure) (HCC)   Viral pneumonia   Chronic hypercapnic respiratory failure (HCC)    Body mass index is 55.94 kg/m.  (Morbid obesity): This complicates overall care and prognosis.  Weight loss advised.   Acute on chronic hypoxic respiratory failure, Chronic hypercapnic respiratory failure: She is still requiring 2 L/min oxygen.  She will need home oxygen at discharge.  ABG showed pH 7.51, PCO2 60, PO2 73 on FiO2 of 28%.  Case discussed with social worker to arrange for trilogy machine/BiPAP at discharge.  Bedside spirometry has been ordered.   Suspect she may have underlying obstructive sleep apnea.  She said her husband said she snores and stops breathing in her sleep.  Outpatient follow-up with pulmonologist recommended for sleep study.  Bronchopneumonia, likely viral: Continue oxygen therapy.  Antibiotics have been discontinued.  Acute exacerbation of chronic diastolic CHF with peripheral edema: Peripheral edema is improving.  Continue IV  Lasix.  2D echo showed EF estimated at 55 to 38% but LV diastolic parameters were indeterminate.  Hypokalemia: Replete potassium and monitor levels  Other comorbidities include depression, hyperlipidemia, hepatic steatosis   Diet Order             Diet heart healthy/carb modified Room service appropriate? Yes; Fluid consistency: Thin  Diet effective now                       Consultants: None  Procedures: None    Medications:    ARIPiprazole  7.5 mg Oral QHS   atorvastatin  10 mg Oral QHS   buPROPion  300 mg Oral q morning   cariprazine  6 mg Oral Daily   enoxaparin (LOVENOX) injection  0.5 mg/kg Subcutaneous Q24H   furosemide  40 mg Intravenous Q12H   mometasone-formoterol  2 puff Inhalation BID   Continuous Infusions:   Anti-infectives (From admission, onward)    Start     Dose/Rate Route Frequency Ordered Stop   10/19/21 2100  cefTRIAXone (ROCEPHIN) 2 g in sodium chloride 0.9 % 100 mL IVPB  Status:  Discontinued        2 g 200 mL/hr over 30 Minutes Intravenous Every 24 hours 10/19/21 2008 10/19/21 2203   10/19/21 2000  azithromycin (ZITHROMAX) 500 mg in sodium chloride 0.9 % 250 mL IVPB        500 mg 250 mL/hr over 60 Minutes Intravenous  Once 10/19/21 1946 10/19/21 2146   10/19/21 2000  cefTRIAXone (ROCEPHIN) 1 g in sodium chloride 0.9 % 100  mL IVPB  Status:  Discontinued        1 g 200 mL/hr over 30 Minutes Intravenous  Once 10/19/21 1946 10/19/21 15-Nov-2002              Family Communication/Anticipated D/C date and plan/Code Status   DVT prophylaxis: Place TED hose Start: 10/19/21 11-14-2001     Code Status: Full Code  Family Communication: None Disposition Plan: Plan to discharge home tomorrow   Status is: Inpatient Remains inpatient appropriate because: Shortness of breath, IV Lasix               Subjective:   Interval events noted.  She complains of shortness of breath with minimal exertion.  Objective:    Vitals:    10/22/21 0308 10/22/21 0500 10/22/21 0745 10/22/21 1126  BP: (!) 130/59  124/70 131/82  Pulse: 72  75 82  Resp: 16  18 19   Temp: 97.8 F (36.6 C)  97.9 F (36.6 C) 97.9 F (36.6 C)  TempSrc:   Oral   SpO2: 100%  94% 94%  Weight:  (!) 162 kg    Height:       No data found.   Intake/Output Summary (Last 24 hours) at 10/22/2021 1815 Last data filed at 10/22/2021 1300 Gross per 24 hour  Intake 240 ml  Output 2300 ml  Net -11/15/58 ml   Filed Weights   10/19/21 1547 10/21/21 0500 10/22/21 0500  Weight: (!) 179.2 kg (!) 162.9 kg (!) 162 kg    Exam:  GEN: NAD SKIN: Warm and dry EYES: EOMI ENT: MMM CV: RRR PULM: CTA B ABD: soft, obese, NT, +BS CNS: AAO x 3, non focal EXT: Trace bilateral leg edema        Data Reviewed:   I have personally reviewed following labs and imaging studies:  Labs: Labs show the following:   Basic Metabolic Panel: Recent Labs  Lab 10/19/21 1615 10/20/21 0523 10/22/21 0658  NA 134* 136 137  K 3.8 4.3 3.4*  CL 95* 92* 88*  CO2 29 28 37*  GLUCOSE 141* 126* 175*  BUN 10 11 20   CREATININE 0.70 0.78 0.80  CALCIUM 8.4* 8.7* 9.6   GFR Estimated Creatinine Clearance: 145.7 mL/min (by C-G formula based on SCr of 0.8 mg/dL). Liver Function Tests: No results for input(s): AST, ALT, ALKPHOS, BILITOT, PROT, ALBUMIN in the last 168 hours. No results for input(s): LIPASE, AMYLASE in the last 168 hours. No results for input(s): AMMONIA in the last 168 hours. Coagulation profile No results for input(s): INR, PROTIME in the last 168 hours.  CBC: Recent Labs  Lab 10/19/21 1615 10/20/21 0523 10/22/21 0658  WBC 11.2* 10.5 11.0*  NEUTROABS 8.1*  --  6.8  HGB 13.2 13.6 14.9  HCT 42.6 44.5 47.5*  MCV 86.4 88.1 85.1  PLT 216 206 235   Cardiac Enzymes: No results for input(s): CKTOTAL, CKMB, CKMBINDEX, TROPONINI in the last 168 hours. BNP (last 3 results) No results for input(s): PROBNP in the last 8760 hours. CBG: No results for input(s):  GLUCAP in the last 168 hours. D-Dimer: No results for input(s): DDIMER in the last 72 hours. Hgb A1c: No results for input(s): HGBA1C in the last 72 hours. Lipid Profile: No results for input(s): CHOL, HDL, LDLCALC, TRIG, CHOLHDL, LDLDIRECT in the last 72 hours. Thyroid function studies: Recent Labs    10/19/21 11/14/2008  TSH 1.910   Anemia work up: No results for input(s): VITAMINB12, FOLATE, FERRITIN, TIBC, IRON, RETICCTPCT in the  last 72 hours. Sepsis Labs: Recent Labs  Lab 10/19/21 1615 10/19/21 2010 10/19/21 2055 10/20/21 0523 10/21/21 0624 10/22/21 0658  PROCALCITON  --  <0.10  --  <0.10 <0.10  --   WBC 11.2*  --   --  10.5  --  11.0*  LATICACIDVEN  --  0.8 0.9  --   --   --     Microbiology Recent Results (from the past 240 hour(s))  Resp Panel by RT-PCR (Flu A&B, Covid) Nasopharyngeal Swab     Status: None   Collection Time: 10/19/21  4:07 PM   Specimen: Nasopharyngeal Swab; Nasopharyngeal(NP) swabs in vial transport medium  Result Value Ref Range Status   SARS Coronavirus 2 by RT PCR NEGATIVE NEGATIVE Final    Comment: (NOTE) SARS-CoV-2 target nucleic acids are NOT DETECTED.  The SARS-CoV-2 RNA is generally detectable in upper respiratory specimens during the acute phase of infection. The lowest concentration of SARS-CoV-2 viral copies this assay can detect is 138 copies/mL. A negative result does not preclude SARS-Cov-2 infection and should not be used as the sole basis for treatment or other patient management decisions. A negative result may occur with  improper specimen collection/handling, submission of specimen other than nasopharyngeal swab, presence of viral mutation(s) within the areas targeted by this assay, and inadequate number of viral copies(<138 copies/mL). A negative result must be combined with clinical observations, patient history, and epidemiological information. The expected result is Negative.  Fact Sheet for Patients:   EntrepreneurPulse.com.au  Fact Sheet for Healthcare Providers:  IncredibleEmployment.be  This test is no t yet approved or cleared by the [place] FDA and  has been authorized for detection and/or diagnosis of SARS-CoV-2 by FDA under an Emergency Use Authorization (EUA). This EUA will remain  in effect (meaning this test can be used) for the duration of the COVID-19 declaration under Section 564(b)(1) of the Act, 21 U.S.C.section 360bbb-3(b)(1), unless the authorization is terminated  or revoked sooner.       Influenza A by PCR NEGATIVE NEGATIVE Final   Influenza B by PCR NEGATIVE NEGATIVE Final    Comment: (NOTE) The Xpert Xpress SARS-CoV-2/FLU/RSV plus assay is intended as an aid in the diagnosis of influenza from Nasopharyngeal swab specimens and should not be used as a sole basis for treatment. Nasal washings and aspirates are unacceptable for Xpert Xpress SARS-CoV-2/FLU/RSV testing.  Fact Sheet for Patients: EntrepreneurPulse.com.au  Fact Sheet for Healthcare Providers: IncredibleEmployment.be  This test is not yet approved or cleared by the [place] FDA and has been authorized for detection and/or diagnosis of SARS-CoV-2 by FDA under an Emergency Use Authorization (EUA). This EUA will remain in effect (meaning this test can be used) for the duration of the COVID-19 declaration under Section 564(b)(1) of the Act, 21 U.S.C. section 360bbb-3(b)(1), unless the authorization is terminated or revoked.  Performed at [facility], [address] [place], [place] [address]   Blood culture (routine x 2)     Status: None (Preliminary result)   Collection Time: 10/19/21  8:10 PM   Specimen: BLOOD  Result Value Ref Range Status   Specimen Description BLOOD LFOA  Final   Special Requests BOTTLES DRAWN AEROBIC AND ANAEROBIC [place]  Final   Culture   Final    NO GROWTH 3 DAYS Performed at  [facility], [address], [place], [place] [address]    Report Status PENDING  Incomplete  Blood culture (routine x 2)     Status: None (Preliminary  result)   Collection Time: 10/19/21  8:52 PM   Specimen: BLOOD  Result Value Ref Range Status   Specimen Description BLOOD [facility]  Final   Special Requests BOTTLES DRAWN AEROBIC AND ANAEROBIC BCLV  Final   Culture   Final    NO GROWTH 3 DAYS Performed at [facility], [address] [place], [place] [address]    Report Status PENDING  Incomplete  Respiratory (~20 pathogens) panel by PCR     Status: Abnormal   Collection Time: 10/20/21  9:10 PM   Specimen: Nasopharyngeal Swab; Respiratory  Result Value Ref Range Status   Adenovirus NOT DETECTED NOT DETECTED Final   Coronavirus 229E NOT DETECTED NOT DETECTED Final    Comment: (NOTE) The Coronavirus on the Respiratory Panel, DOES NOT test for the novel  Coronavirus (2019 nCoV)    Coronavirus HKU1 NOT DETECTED NOT DETECTED Final   Coronavirus NL63 NOT DETECTED NOT DETECTED Final   Coronavirus OC43 NOT DETECTED NOT DETECTED Final   Metapneumovirus DETECTED (A) NOT DETECTED Final   Rhinovirus / Enterovirus NOT DETECTED NOT DETECTED Final   Influenza A NOT DETECTED NOT DETECTED Final   Influenza B NOT DETECTED NOT DETECTED Final   Parainfluenza Virus 1 NOT DETECTED NOT DETECTED Final   Parainfluenza Virus 2 NOT DETECTED NOT DETECTED Final   Parainfluenza Virus 3 NOT DETECTED NOT DETECTED Final   Parainfluenza Virus 4 NOT DETECTED NOT DETECTED Final   Respiratory Syncytial Virus NOT DETECTED NOT DETECTED Final   Bordetella pertussis NOT DETECTED NOT DETECTED Final   Bordetella Parapertussis NOT DETECTED NOT DETECTED Final   Chlamydophila pneumoniae NOT DETECTED NOT DETECTED Final   Mycoplasma pneumoniae NOT DETECTED NOT DETECTED Final    Comment: Performed at [facility], [place]. [address] [place], [place] [address]    Procedures and diagnostic studies:  No  results found.             LOS: 2 days   Jennamarie Goings  [facility]   Pager on [email or web address]. If 7PM-7AM, please contact night-coverage at [email or web address]     10/22/2021, 6:15 PM

## 2021-10-22 NOTE — Progress Notes (Signed)
Spirometry with graph placed on chart. ?

## 2021-10-22 NOTE — Progress Notes (Signed)
SATURATION QUALIFICATIONS: (This note is used to comply with regulatory documentation for home oxygen) ? ?Patient Saturations on Room Air at Rest = 90% ? ?Patient Saturations on Room Air while Ambulating = 85% ? ?Patient Saturations on 2 Liters of oxygen while Ambulating = 92% ? ?Please briefly explain why patient needs home oxygen: ?Patient maintains ambulating sats on 2L [place] ?

## 2021-10-23 ENCOUNTER — Telehealth: Payer: Self-pay | Admitting: Internal Medicine

## 2021-10-23 DIAGNOSIS — Z6841 Body Mass Index (BMI) 40.0 and over, adult: Secondary | ICD-10-CM

## 2021-10-23 DIAGNOSIS — E662 Morbid (severe) obesity with alveolar hypoventilation: Secondary | ICD-10-CM

## 2021-10-23 LAB — BASIC METABOLIC PANEL
Anion gap: 12 (ref 5–15)
BUN: 22 mg/dL — ABNORMAL HIGH (ref 6–20)
CO2: 36 mmol/L — ABNORMAL HIGH (ref 22–32)
Calcium: 9.2 mg/dL (ref 8.9–10.3)
Chloride: 87 mmol/L — ABNORMAL LOW (ref 98–111)
Creatinine, Ser: 0.86 mg/dL (ref 0.44–1.00)
GFR, Estimated: >60 mL/min (ref >60)
Glucose, Bld: 158 mg/dL — ABNORMAL HIGH (ref 70–99)
Potassium: 3.3 mmol/L — ABNORMAL LOW (ref 3.5–5.1)
Sodium: 135 mmol/L (ref 135–145)

## 2021-10-23 LAB — CBC WITH DIFFERENTIAL/PLATELET
Abs Immature Granulocytes: 0.08 10*3/uL — ABNORMAL HIGH (ref 0.00–0.07)
Basophils Absolute: 0.1 10*3/uL (ref 0.0–0.1)
Basophils Relative: 0 %
Eosinophils Absolute: 0.5 10*3/uL (ref 0.0–0.5)
Eosinophils Relative: 4 %
HCT: 47.5 % — ABNORMAL HIGH (ref 36.0–46.0)
Hemoglobin: 15.1 g/dL — ABNORMAL HIGH (ref 12.0–15.0)
Immature Granulocytes: 1 %
Lymphocytes Relative: 32 %
Lymphs Abs: 3.8 10*3/uL (ref 0.7–4.0)
MCH: 27.1 pg (ref 26.0–34.0)
MCHC: 31.8 g/dL (ref 30.0–36.0)
MCV: 85.3 fL (ref 80.0–100.0)
Monocytes Absolute: 0.5 10*3/uL (ref 0.1–1.0)
Monocytes Relative: 4 %
Neutro Abs: 7.1 10*3/uL (ref 1.7–7.7)
Neutrophils Relative %: 59 %
Platelets: 227 10*3/uL (ref 150–400)
RBC: 5.57 MIL/uL — ABNORMAL HIGH (ref 3.87–5.11)
RDW: 15.9 % — ABNORMAL HIGH (ref 11.5–15.5)
WBC: 12 10*3/uL — ABNORMAL HIGH (ref 4.0–10.5)
nRBC: 0 % (ref 0.0–0.2)

## 2021-10-23 LAB — MAGNESIUM: Magnesium: 2 mg/dL (ref 1.7–2.4)

## 2021-10-23 MED ORDER — BUDESONIDE-FORMOTEROL FUMARATE 160-4.5 MCG/ACT IN AERO: 2.0000 | INHALATION_SPRAY | Freq: Two times a day (BID) | RESPIRATORY_TRACT | 0 refills | Status: DC

## 2021-10-23 MED ORDER — POTASSIUM CHLORIDE CRYS ER 20 MEQ PO TBCR
40.0000 meq | EXTENDED_RELEASE_TABLET | Freq: Once | ORAL | Status: AC
  Administered 2021-10-23: 40 meq via ORAL
  Filled 2021-10-23: qty 2

## 2021-10-23 MED ORDER — ALBUTEROL SULFATE HFA 108 (90 BASE) MCG/ACT IN AERS: 1.0000 | INHALATION_SPRAY | Freq: Four times a day (QID) | RESPIRATORY_TRACT | 0 refills | Status: DC | PRN

## 2021-10-23 NOTE — Discharge Summary (Addendum)
Physician Discharge Summary  Krista Mcgee:[id number] DOB: 11-03-1975 DOA: 10/19/2021  PCP: Delsa Grana, PA-C  Admit date: 10/19/2021 Discharge date: 10/23/2021  Discharge disposition: Home   Recommendations for Outpatient Follow-Up:   Follow-up with PCP in 1 week Follow-up with pulmonologist in 2 weeks for sleep study   Discharge Diagnosis:   Principal Problem:   Acute on chronic respiratory failure with hypoxia ([place]) Active Problems:   Insomnia   Major depressive disorder, recurrent, in full remission with anxious distress (HCC)   Hirsutism   High triglycerides   Hepatic steatosis   Morbid obesity with BMI of 60.0-69.9, adult (HCC)   Tobacco use   Acute exacerbation of CHF (congestive heart failure) (HCC)   Viral pneumonia   Chronic hypercapnic respiratory failure ([place])   Obesity hypoventilation syndrome ([place])    Discharge Condition: Stable.  Diet recommendation:  Diet Order             Diet - low sodium heart healthy           Diet heart healthy/carb modified Room service appropriate? Yes; Fluid consistency: Thin  Diet effective now                     Code Status: Full Code     Hospital Course:   Krista Mcgee is a 46 y.o. female with medical history significant for morbid obesity, hypertension, type II DM, depression, anxiety, presented to the hospital because of shortness of breath.   She was found to have oxygen saturation of 83% on room air and bronchopneumonia.  She was treated with oxygen via nasal cannula, BiPAP and empiric IV antibiotics.  Respiratory viral panel showed metapneumovirus which was likely the cause of her pneumonia.  Antibiotics were discontinued.    She has morbid obesity and ABG showed chronic hypercapnic respiratory failure.  Bedside spirometry showed FEV1/FVC ratio of 77.4. Chronic hypercapnia is likely from obesity hypoventilation syndrome causing restrictive thoracic disease.  She will be discharged home on  trilogy machine for obesity hypoventilation syndrome.  She also has chronic hypoxic respiratory failure and needs 2 L/min oxygen via nasal cannula.  Follow-up with pulmonologist was recommended for sleep study.  Her condition has improved and she is deemed stable for discharge to home today.       Discharge Exam:    Vitals:   10/23/21 0410 10/23/21 0500 10/23/21 0810 10/23/21 1110  BP: 113/65  110/62 126/66  Pulse: 68  72 70  Resp: 12  16 18   Temp: 98.1 F (36.7 C)  98.3 F (36.8 C) 98.1 F (36.7 C)  TempSrc:   Axillary   SpO2: 98%  97% 96%  Weight:  (!) 161.8 kg    Height:         GEN: NAD SKIN: Warm and dry EYES: No pallor or icterus ENT: MMM CV: RRR PULM: CTA B ABD: soft, obese, NT, +BS CNS: AAO x 3, non focal EXT: No edema or tenderness   The results of significant diagnostics from this hospitalization (including imaging, microbiology, ancillary and laboratory) are listed below for reference.     Procedures and Diagnostic Studies:   ECHOCARDIOGRAM COMPLETE  Result Date: 10/20/2021    ECHOCARDIOGRAM REPORT   Patient Name:   Krista Mcgee Date of Exam: 10/20/2021 Medical Rec #:  [id number]        Height:       67.0 in Accession #:    [id number]  Weight:       395.0 lb Date of Birth:  [birth year]        BSA:          2.701 m Patient Age:    45 years         BP:           117/62 mmHg Patient Gender: F                HR:           77 bpm. Exam Location:  [facility] Procedure: 2D Echo, Color Doppler, Cardiac Doppler and Intracardiac            Opacification Agent Indications:     R06.00 Dyspnea  History:         Patient has no prior history of Echocardiogram examinations.                  Risk Factors:Hypertension and Diabetes.  Sonographer:     Charmayne Sheer Referring Phys:  [id number] AMY N COX Diagnosing Phys: Kathlyn Sacramento MD  Sonographer Comments: Technically challenging study due to limited acoustic windows and patient is morbidly obese. IMPRESSIONS  1. Left ventricular  ejection fraction, by estimation, is 55 to 60%. The left ventricle has normal function. Left ventricular endocardial border not optimally defined to evaluate regional wall motion. Left ventricular diastolic parameters are indeterminate.  2. Right ventricular systolic function is normal. The right ventricular size is normal. Tricuspid regurgitation signal is inadequate for assessing PA pressure.  3. The mitral valve was not well visualized. No evidence of mitral valve regurgitation. No evidence of mitral stenosis.  4. The aortic valve was not well visualized. Aortic valve regurgitation is not visualized. No aortic stenosis is present.  5. Technically challenging study due to limited acoustic windows and patient is morbidly obese.          FINDINGS  Left Ventricle: Left ventricular ejection fraction, by estimation, is 55 to 60%. The left ventricle has normal function. Left ventricular endocardial border not optimally defined to evaluate regional wall motion. Definity contrast agent was given IV to delineate the left ventricular endocardial borders. The left ventricular internal cavity size was normal in size. There is no left ventricular hypertrophy. Left ventricular diastolic parameters are indeterminate. Right Ventricle: The right ventricular size is normal. No increase in right ventricular wall thickness. Right ventricular systolic function is normal. Tricuspid regurgitation signal is inadequate for assessing PA pressure. Left Atrium: Left atrial size was not well visualized. Right Atrium: Right atrial size was not well visualized. Pericardium: There is no evidence of pericardial effusion. Mitral Valve: The mitral valve was not well visualized. No evidence of mitral valve regurgitation. No evidence of mitral valve stenosis. MV peak gradient, 4.2 mmHg. The mean mitral valve gradient is 2.0 mmHg. Tricuspid Valve: The tricuspid valve is not well visualized. Tricuspid valve regurgitation is not demonstrated. No  evidence of tricuspid stenosis. Aortic Valve: The aortic valve was not well visualized. Aortic valve regurgitation is not visualized. No aortic stenosis is present. Pulmonic Valve: The pulmonic valve was not well visualized. Pulmonic valve regurgitation is not visualized. No evidence of pulmonic stenosis. Aorta: The aortic root was not well visualized. Venous: The inferior vena cava was not well visualized. IAS/Shunts: No atrial level shunt detected by color flow Doppler.  LEFT VENTRICLE PLAX 2D LVOT diam:     2.30 cm LVOT Area:     4.15 cm   AORTA Ao Root diam: 2.70 cm MITRAL  VALVE MV Area (PHT): 3.42 cm    SHUNTS MV Peak grad:  4.2 mmHg    Systemic Diam: 2.30 cm MV Mean grad:  2.0 mmHg MV Vmax:       1.02 m/s MV Vmean:      72.5 cm/s MV Decel Time: 222 msec MV E velocity: 84.80 cm/s MV A velocity: 93.00 cm/s MV E/A ratio:  0.91 Kathlyn Sacramento MD Electronically signed by Kathlyn Sacramento MD Signature Date/Time: 10/20/2021/3:21:18 PM    Final      Labs:   Basic Metabolic Panel: Recent Labs  Lab 10/19/21 1615 10/20/21 0523 10/22/21 0658 10/23/21 0609  NA 134* 136 137 135  K 3.8 4.3 3.4* 3.3*  CL 95* 92* 88* 87*  CO2 29 28 37* 36*  GLUCOSE 141* 126* 175* 158*  BUN 10 11 20  22*  CREATININE 0.70 0.78 0.80 0.86  CALCIUM 8.4* 8.7* 9.6 9.2  MG  --   --   --  2.0   GFR Estimated Creatinine Clearance: 135.4 mL/min (by C-G formula based on SCr of 0.86 mg/dL). Liver Function Tests: No results for input(s): AST, ALT, ALKPHOS, BILITOT, PROT, ALBUMIN in the last 168 hours. No results for input(s): LIPASE, AMYLASE in the last 168 hours. No results for input(s): AMMONIA in the last 168 hours. Coagulation profile No results for input(s): INR, PROTIME in the last 168 hours.  CBC: Recent Labs  Lab 10/19/21 1615 10/20/21 0523 10/22/21 0658 10/23/21 0609  WBC 11.2* 10.5 11.0* 12.0*  NEUTROABS 8.1*  --  6.8 7.1  HGB 13.2 13.6 14.9 15.1*  HCT 42.6 44.5 47.5* 47.5*  MCV 86.4 88.1 85.1 85.3  PLT  216 206 235 227   Cardiac Enzymes: No results for input(s): CKTOTAL, CKMB, CKMBINDEX, TROPONINI in the last 168 hours. BNP: Invalid input(s): POCBNP CBG: No results for input(s): GLUCAP in the last 168 hours. D-Dimer No results for input(s): DDIMER in the last 72 hours. Hgb A1c No results for input(s): HGBA1C in the last 72 hours. Lipid Profile No results for input(s): CHOL, HDL, LDLCALC, TRIG, CHOLHDL, LDLDIRECT in the last 72 hours. Thyroid function studies No results for input(s): TSH, T4TOTAL, T3FREE, THYROIDAB in the last 72 hours.  Invalid input(s): FREET3 Anemia work up No results for input(s): VITAMINB12, FOLATE, FERRITIN, TIBC, IRON, RETICCTPCT in the last 72 hours. Microbiology Recent Results (from the past 240 hour(s))  Resp Panel by RT-PCR (Flu A&B, Covid) Nasopharyngeal Swab     Status: None   Collection Time: 10/19/21  4:07 PM   Specimen: Nasopharyngeal Swab; Nasopharyngeal(NP) swabs in vial transport medium  Result Value Ref Range Status   SARS Coronavirus 2 by RT PCR NEGATIVE NEGATIVE Final    Comment: (NOTE) SARS-CoV-2 target nucleic acids are NOT DETECTED.  The SARS-CoV-2 RNA is generally detectable in upper respiratory specimens during the acute phase of infection. The lowest concentration of SARS-CoV-2 viral copies this assay can detect is 138 copies/mL. A negative result does not preclude SARS-Cov-2 infection and should not be used as the sole basis for treatment or other patient management decisions. A negative result may occur with  improper specimen collection/handling, submission of specimen other than nasopharyngeal swab, presence of viral mutation(s) within the areas targeted by this assay, and inadequate number of viral copies(<138 copies/mL). A negative result must be combined with clinical observations, patient history, and epidemiological information. The expected result is Negative.  Fact Sheet for Patients:   EntrepreneurPulse.com.au  Fact Sheet for Healthcare Providers:  IncredibleEmployment.be  This test  is no t yet approved or cleared by the [place] and  has been authorized for detection and/or diagnosis of SARS-CoV-2 by FDA under an Emergency Use Authorization (EUA). This EUA will remain  in effect (meaning this test can be used) for the duration of the COVID-19 declaration under Section 564(b)(1) of the Act, 21 U.S.C.section 360bbb-3(b)(1), unless the authorization is terminated  or revoked sooner.       Influenza A by PCR NEGATIVE NEGATIVE Final   Influenza B by PCR NEGATIVE NEGATIVE Final    Comment: (NOTE) The Xpert Xpress SARS-CoV-2/FLU/RSV plus assay is intended as an aid in the diagnosis of influenza from Nasopharyngeal swab specimens and should not be used as a sole basis for treatment. Nasal washings and aspirates are unacceptable for Xpert Xpress SARS-CoV-2/FLU/RSV testing.  Fact Sheet for Patients: EntrepreneurPulse.com.au  Fact Sheet for Healthcare Providers: IncredibleEmployment.be  This test is not yet approved or cleared by the [place] FDA and has been authorized for detection and/or diagnosis of SARS-CoV-2 by FDA under an Emergency Use Authorization (EUA). This EUA will remain in effect (meaning this test can be used) for the duration of the COVID-19 declaration under Section 564(b)(1) of the Act, 21 U.S.C. section 360bbb-3(b)(1), unless the authorization is terminated or revoked.  Performed at [facility], [address] [place], [place] [address]   Blood culture (routine x 2)     Status: None (Preliminary result)   Collection Time: 10/19/21  8:10 PM   Specimen: BLOOD  Result Value Ref Range Status   Specimen Description BLOOD LFOA  Final   Special Requests BOTTLES DRAWN AEROBIC AND ANAEROBIC [place]  Final   Culture   Final    NO GROWTH 4 DAYS Performed at  [facility], [address] [place], [place] [address]    Report Status PENDING  Incomplete  Blood culture (routine x 2)     Status: None (Preliminary result)   Collection Time: 10/19/21  8:52 PM   Specimen: BLOOD  Result Value Ref Range Status   Specimen Description BLOOD [facility]  Final   Special Requests BOTTLES DRAWN AEROBIC AND ANAEROBIC BCLV  Final   Culture   Final    NO GROWTH 4 DAYS Performed at [facility], [address] [place],  [address]    Report Status PENDING  Incomplete  Respiratory (~20 pathogens) panel by PCR     Status: Abnormal   Collection Time: 10/20/21  9:10 PM   Specimen: Nasopharyngeal Swab; Respiratory  Result Value Ref Range Status   Adenovirus NOT DETECTED NOT DETECTED Final   Coronavirus 229E NOT DETECTED NOT DETECTED Final    Comment: (NOTE) The Coronavirus on the Respiratory Panel, DOES NOT test for the novel  Coronavirus (2019 nCoV)    Coronavirus HKU1 NOT DETECTED NOT DETECTED Final   Coronavirus NL63 NOT DETECTED NOT DETECTED Final   Coronavirus OC43 NOT DETECTED NOT DETECTED Final   Metapneumovirus DETECTED (A) NOT DETECTED Final   Rhinovirus / Enterovirus NOT DETECTED NOT DETECTED Final   Influenza A NOT DETECTED NOT DETECTED Final   Influenza B NOT DETECTED NOT DETECTED Final   Parainfluenza Virus 1 NOT DETECTED NOT DETECTED Final   Parainfluenza Virus 2 NOT DETECTED NOT DETECTED Final   Parainfluenza Virus 3 NOT DETECTED NOT DETECTED Final   Parainfluenza Virus 4 NOT DETECTED NOT DETECTED Final   Respiratory Syncytial Virus NOT DETECTED NOT DETECTED Final   Bordetella pertussis NOT DETECTED NOT DETECTED Final  Bordetella Parapertussis NOT DETECTED NOT DETECTED Final   Chlamydophila pneumoniae NOT DETECTED NOT DETECTED Final   Mycoplasma pneumoniae NOT DETECTED NOT DETECTED Final    Comment: Performed at [facility], [place] [address] [place], [place] [address]     Discharge Instructions:   Discharge  Instructions     Diet - low sodium heart healthy   Complete by: As directed    For home use only DME Bipap   Complete by: As directed    Length of Need: Lifetime   For home use only DME oxygen   Complete by: As directed    Length of Need: Lifetime   Mode or (Route): Nasal cannula   Liters per Minute: 2   Frequency: Continuous (stationary and portable oxygen unit needed)   Oxygen conserving device: Yes   Oxygen delivery system: Gas   Increase activity slowly   Complete by: As directed       Allergies as of 10/23/2021   No Known Allergies      Medication List     STOP taking these medications    empagliflozin 10 MG Tabs tablet Commonly known as: Jardiance   metFORMIN 500 MG 24 hr tablet Commonly known as: GLUCOPHAGE-XR   Saphris 10 MG Subl Generic drug: Asenapine Maleate   venlafaxine XR 75 MG 24 hr capsule Commonly known as: EFFEXOR-XR       TAKE these medications    Abilify 5 MG tablet Generic drug: ARIPiprazole TAKE 1 AND 1/2 TABLETS BY MOUTH AT BEDTIME   albuterol 108 (90 Base) MCG/ACT inhaler Commonly known as: Ventolin HFA Inhale 1-2 puffs into the lungs every 6 (six) hours as needed for wheezing or shortness of breath. What changed: See the new instructions.   ALPRAZolam 1 MG tablet Commonly known as: XANAX TAKE 1 TABLET BY MOUTH 3 TIMES A DAY AS NEEDED FOR ANXIETY   atorvastatin 10 MG tablet Commonly known as: LIPITOR TAKE 1 TABLET BY MOUTH EVERYDAY AT BEDTIME   budesonide-formoterol 160-4.5 MCG/ACT inhaler Commonly known as: SYMBICORT Inhale 2 puffs into the lungs 2 (two) times daily.   buPROPion 300 MG 24 hr tablet Commonly known as: WELLBUTRIN XL Take 300 mg by mouth every morning.   Vraylar 6 MG Caps Generic drug: Cariprazine HCl Take 1 capsule by mouth daily. What changed: Another medication with the same name was removed. Continue taking this medication, and follow the directions you see here.   zolpidem 12.5 MG CR  tablet Commonly known as: AMBIEN CR Take 12.5 mg by mouth at bedtime.               Durable Medical Equipment  (From admission, onward)           Start     Ordered   10/22/21 0000  For home use only DME oxygen       Question Answer Comment  Length of Need Lifetime   Mode or (Route) Nasal cannula   Liters per Minute 2   Frequency Continuous (stationary and portable oxygen unit needed)   Oxygen conserving device Yes   Oxygen delivery system Gas      10/22/21 1414   10/22/21 0000  For home use only DME Bipap       Question:  Length of Need  Answer:  Lifetime   10/22/21 1414            Follow-up Information     Flora Lipps, MD. Schedule an appointment as soon as possible for  a visit in 2 week(s).   Specialties: Pulmonary Disease, Cardiology Why: Follow-up for sleep study chronic hypoxic and hypercapnic respiratory failure Contact information: [address] [place] [address] [phone]                   If you experience worsening of your admission symptoms, develop shortness of breath, life threatening emergency, suicidal or homicidal thoughts you must seek medical attention immediately by calling 911 or calling your MD immediately  if symptoms less severe.   You must read complete instructions/literature along with all the possible adverse reactions/side effects for all the medicines you take and that have been prescribed to you. Take any new medicines after you have completely understood and accept all the possible adverse reactions/side effects.    Please note   You were cared for by a hospitalist during your hospital stay. If you have any questions about your discharge medications or the care you received while you were in the hospital after you are discharged, you can call the unit and asked to speak with the hospitalist on call if the hospitalist that took care of you is not available. Once you are discharged, your primary care  physician will handle any further medical issues. Please note that NO REFILLS for any discharge medications will be authorized once you are discharged, as it is imperative that you return to your primary care physician (or establish a relationship with a primary care physician if you do not have one) for your aftercare needs so that they can reassess your need for medications and monitor your lab values.       Time coordinating discharge: 30 and 30 minutes  Signed:  Babara Buffalo  [facility] 10/23/2021, 12:25 PM   Pager on [email or web address]. If 7PM-7AM, please contact night-coverage at [email or web address]

## 2021-10-23 NOTE — TOC Transition Note (Signed)
Transition of Care (TOC) - CM/SW Discharge Note ? ? ?Patient Details  ?Name: LATONYA NELON ?MRN: [id number] ?Date of Birth: 11-01-1975 ? ?Transition of Care (TOC) CM/SW Contact:  ?Alberteen Sam, LCSW ?Phone Number: ?10/23/2021, 11:56 AM ? ? ?Clinical Narrative:    ? ?Patient to discharge home today with self care.  ? ?Patient has had 2l O2 delivered to room via [employer] and they are to complete home set up. Trilogy has also been ordered through [employer].  ? ?No further discharge needs identified at this time.  ? ? ?Final next level of care: Home/Self Care ?Barriers to Discharge: No Barriers Identified ? ? ?Patient Goals and CMS Choice ?Patient states their goals for this hospitalization and ongoing recovery are:: to go home ?CMS Medicare.gov Compare Post Acute Care list provided to:: Patient ?Choice offered to / list presented to : Patient ? ?Discharge Placement ?  ?           ?  ?  ?  ?Patient and family notified of of transfer: 10/23/21 ? ?Discharge Plan and Services ?  ?  ?           ?DME Arranged: NIV, Oxygen ?DME Agency: [employer] ?Date DME Agency Contacted: 10/23/21 ?Time DME Agency Contacted: 0037 ?Representative spoke with at DME Agency: Thedore Mins ?  ?  ?  ?  ?  ? ?Social Determinants of Health (SDOH) Interventions ?  ? ? ?Readmission Risk Interventions ?No flowsheet data found. ? ? ? ? ?

## 2021-10-23 NOTE — Consult Note (Signed)
? ?  Heart Failure Nurse Navigator Note ? ?HFpEF 55 to 60%.  Unable to determine diastolic function. ? ? ?Patient presented to the emergency room with complaints of worsening shortness of breath and a cough.  Along with complaints of chest discomfort. ? ? ?Comorbidities: ? ?Hepatic steatosis ?Morbid obesity with a BMI of 62 ?Hirsutism ?Tobacco abuse and vaping ?Major depressive disorder ?Obesity ventilation syndrome ? ?Medications: ? ?Lipitor 10 mg daily ?NicoDerm patch ? ?She is not discharged home on a diuretic. ? ?Labs: ? ?Sodium 135, potassium 3.3, chloride 87, CO2 36, BUN 22, creatinine 0.86, magnesium 2. ?Weight is 161.8 kg ?Blood pressure 126/66 ?Intake 1200 mL ?Output 3000 mL ? ? ?Initial meeting with patient today she was lying in bed in no acute distress.  She is aware that she was admitted with pneumonia and heart failure.  Discussed what heart failure means. ? ?She states that she lives at home with her husband and granddaughter.  She was 1 that likes salt but discussed eating low-sodium, also discussed foods to avoid. ? ?Talked about daily weights and what to report.  When questioned she was not sure if she had a scale at home or not.  Supplied her with a scale from TOC. ? ?Discussed outpatient heart failure clinic, she has an appointment on March 10 at 10 AM.  She has a 35% no-show ratio which is 14 out of 40 appointments. ? ?She was given the living with heart failure teaching booklet, information on low-sodium, zone magnet and weight sheet. ? ?Pricilla Riffle RN [facility] ?

## 2021-10-23 NOTE — Progress Notes (Signed)
Ms. Dines presents with acute on chronic hypoxic and hypercapnic respiratory failure with morbid obesity/Obesity Hypoventilation Syndrome that is causing thoracic restrictive disease.  The use of the NIV will treat patient?s high PC02 levels (60 on 10/22/21 while using BiPAP during admission) and ventilatory defects seen during further testing on 10/22/21.  NIV use can reduce risk of exacerbations and future hospitalizations when used at night and during the day.  All alternate devices [phone] and [id number]) have been proven ineffective to provide essential volume control necessary to maintain acceptable CO2 levels.  An NIV with IVAPS is necessary to prevent patient from life-threatening harm.  Interruption or failure to provide NIV would quickly lead to exacerbation of the patient?s condition, hospital admission, and likely harm to the patient. Continued use is preferred.  Patient is able to protect their airways and clear secretions on their own. ? Pricilla Riffle,  ?[phone] ? ?

## 2021-10-23 NOTE — Telephone Encounter (Signed)
Appt scheduled 11/20/2021 at 4:30. ?Patient is aware and voiced her understanding.  ?Nothing further needed.  ? ?

## 2021-10-23 NOTE — Progress Notes (Signed)
Patient provided with discharge education and materials, verbalized understanding. IV access and telemetry monitor removed, no issues noted.  ?

## 2021-10-24 LAB — CULTURE, BLOOD (ROUTINE X 2)
Culture: NO GROWTH
Culture: NO GROWTH

## 2021-10-29 NOTE — Progress Notes (Signed)
Patient: Krista Mcgee   DOB: 02/18/1976   MRN: [id number] ? ?Krista Mcgee is a 46 yo female with a PMHx significant for morbid obesity, hypertension, type II DM, depression, anxiety. ? ?Admitted 2/26-10/23/21 with acute on chronic respiratory failure, requiring BiPAP, found to have viral PNA.  ? ?Echo reviewed from 10/20/21 and showed an EF of is 55 to 60%. The left ventricle has normal function. Left ventricular endocardial border not optimally defined to evaluate regional wall motion. Left ventricular diastolic parameters are indeterminate.  ? ?She presents today for her initial visit to the heart failure clinic with a chief complaint of fatigue, SOB, intermittent chest pain, pedal edema and weight gain. She states these symptoms  have been present leading up to her hospital admission and initially improved at discharge, but noticed the weight gain, pedal edema and SOB have worsened over the last 48 hours. She denies palpitations, cough, wheezing, dizziness, abdominal swelling, or orthopnea. ? ?She was provided a scale at discharge, but it is not working any longer. She is watching her sodium intake, and trying to cut back on how much fluid she consumes. She mentions that the attending discussed sending her home with a diuretic, but ultimately she did not get prescribed one at discharge. ? ?He last recorded weight when her scale was working was 354 on 10/25/21, today in office she is 373. She reports her baseline weight is 350. ? ?Past Medical History:  ?Diagnosis Date  ? Anxiety   ? Depression   ? Diabetes mellitus without complication ([place])   ? Hypertension   ? ?Past Surgical History:  ?Procedure Laterality Date  ? CHOLECYSTECTOMY    ? INCONTINENCE SURGERY    ? OOPHORECTOMY    ? TUBAL LIGATION    ? ?Family History  ?Problem Relation Age of Onset  ? Cancer Mother   ?     ovarian  ? Other Father   ?     tumor in his ear drum  ? Diabetes Father   ? Heart disease Father   ? Hyperlipidemia Father   ? Hypertension Father    ? Kidney disease Daughter   ?     hydronephrosis  ? Arthritis Paternal Grandmother   ? COPD Paternal Grandmother   ? Emphysema Paternal Grandmother   ? Heart disease Paternal Grandmother   ? Cancer Paternal Grandfather   ?     skin  ? COPD Paternal Grandfather   ? Emphysema Paternal Grandfather   ? ?Social History  ? ?Tobacco Use  ? Smoking status: Every Day  ? Smokeless tobacco: Never  ? Tobacco comments:  ?  has not smoked in about 42yr.  ?Substance Use Topics  ? Alcohol use: Yes  ?  Alcohol/week: 0.0 standard drinks  ?  Comment: occasionally  ? ?No Known Allergies ? ?Prior to Admission medications   ?Medication Sig Start Date End Date Taking? Authorizing Provider  ?ABILIFY 5 MG tablet TAKE 1 AND 1/2 TABLETS BY MOUTH AT BEDTIME 05/26/15   [provider]  ?albuterol (VENTOLIN HFA) 108 (90 Base) MCG/ACT inhaler Inhale 1-2 puffs into the lungs every 6 (six) hours as needed for wheezing or shortness of breath. 10/23/21   AJennye Boroughs MD  ?ALPRAZolam (Duanne Moron 1 MG tablet TAKE 1 TABLET BY MOUTH 3 TIMES A DAY AS NEEDED FOR ANXIETY 06/26/15   [provider]  ?atorvastatin (LIPITOR) 10 MG tablet TAKE 1 TABLET BY MOUTH EVERYDAY AT BEDTIME ?Patient not taking: Reported on 10/19/2021 08/02/20   TDelsa Grana  PA-C  ?budesonide-formoterol (SYMBICORT) 160-4.5 MCG/ACT inhaler Inhale 2 puffs into the lungs 2 (two) times daily. 10/23/21   Jennye Boroughs, MD  ?buPROPion (WELLBUTRIN XL) 300 MG 24 hr tablet Take 300 mg by mouth every morning. 05/24/18   [provider]  ?VRAYLAR 6 MG CAPS Take 1 capsule by mouth daily. 09/26/21   [provider]  ?zolpidem (AMBIEN CR) 12.5 MG CR tablet Take 12.5 mg by mouth at bedtime. 08/25/21   [provider]  ? ?Review of Systems  ?Constitutional:  Positive for malaise/fatigue.  ?HENT: Negative.    ?Eyes:  Negative for blurred vision and double vision.  ?Respiratory:  Positive for shortness of breath. Negative for cough and wheezing.   ?Cardiovascular:   Positive for chest pain (intermittently over last 2 days) and leg swelling. Negative for palpitations and orthopnea.  ?Gastrointestinal: Negative.   ?Genitourinary: Negative.   ?Musculoskeletal:  Positive for back pain.  ?Skin: Negative.   ?Neurological:  Negative for dizziness, loss of consciousness, weakness and headaches.  ?Endo/Heme/Allergies: Negative.   ?Psychiatric/Behavioral:  The patient is not nervous/anxious.    ?Physical Exam ?Vitals and nursing note reviewed.  ?Constitutional:   ?   General: She is not in acute distress. ?   Appearance: She is obese.  ?Cardiovascular:  ?   Rate and Rhythm: Normal rate and regular rhythm.  ?   Heart sounds: No murmur heard. ?Pulmonary:  ?   Breath sounds: Normal breath sounds. No wheezing, rhonchi or rales.  ?Abdominal:  ?   Palpations: Abdomen is soft.  ?Musculoskeletal:  ?   Right lower leg: Edema (+1) present.  ?   Left lower leg: Edema (+ 1) present.  ?Skin: ?   General: Skin is warm and dry.  ?Neurological:  ?   General: No focal deficit present.  ?   Mental Status: She is alert and oriented to person, place, and time.  ?Psychiatric:     ?   Mood and Affect: Mood normal.  ?  ?Vitals:  ? 10/30/21 1031 10/30/21 1325  ?BP: (!) 157/93 137/72  ?Pulse: 81   ?Resp: 18   ?SpO2: 97%   ?Weight: (!) 373 lb (169.2 kg)   ?Height: '5\' 6"'$  (1.676 m)   ? ?Wt Readings from Last 3 Encounters:  ?10/30/21 (!) 373 lb (169.2 kg)  ?10/23/21 (!) 356 lb 11.3 oz (161.8 kg)  ?08/02/20 (!) 370 lb 8 oz (168.1 kg)  ? ?Lab Results  ?Component Value Date  ? CREATININE 0.86 10/23/2021  ? CREATININE 0.80 10/22/2021  ? CREATININE 0.78 10/20/2021  ? ?Assessment and Plan: ? ?Heart failure with preserved ejection fraction ?- NYHA III ?- she is volume overloaded today  ?- was weighing daily but her scale stopped working, provided new scale and reiterated how to weigh and when to report weight gain (overnight weight gain >2 pounds or weekly weight gain >5 pounds) ?- weight in office is 373, reported  baseline is 350 ?- trying to decrease sodium intake, provided low sodium cookbook ?- discussed total fluid intake to be ~ 64 ounces/day, she is consuming around 100 ounces/day ?- previously on Jardiance for DM but stopped as her DM was "under control" ?- will restart Jardiance '10mg'$  today, discussed proper hygiene as she reported a yeast infection once while on it before ?- provided free 30 day coupon for Jardiance ?- will start lasix 40 mg QD ?- will start potassium chloride 20 mEq QD ?- will check BMP today ?- plans to get established with  cardiology [facility]) ?- BNP 10/19/21 94 ? ?2. HTN ?- BP mildly elevated (137/72) ?- BMP 10/23/21 Sodium 135, Potassium 3.3, Creatinine 0/86, GFR > 60 ?- will see  PCP Reece Packer) 11/05/21 ? ?3. Chronic hypercarbic respiratory failure ?- discharged home with trilogy vent and 2 lpm O2 ?- wearing vent nightly and reports she has slept very well, encouraged continued use and the + benefits it will have on her heart  ?- will see pulmonology Melvyn Novas) 11/20/21 ? ?4. Tobacco abuse ?- total cessation 11 days ago, congratulated her on this accomplishment ? ? ?Return in 10 days, or sooner if needed. ? ?Medications were not brought for review, but verbally reviewed each medication. ?

## 2021-10-30 ENCOUNTER — Ambulatory Visit (HOSPITAL_BASED_OUTPATIENT_CLINIC_OR_DEPARTMENT_OTHER): Payer: Medicaid Other | Admitting: Family

## 2021-10-30 ENCOUNTER — Encounter: Payer: Self-pay | Admitting: Family

## 2021-10-30 ENCOUNTER — Other Ambulatory Visit
Admission: RE | Admit: 2021-10-30 | Discharge: 2021-10-30 | Disposition: A | Discharge status: Home / Self Care | Payer: Medicaid Other | Source: Ambulatory Visit | Attending: Family | Admitting: Family

## 2021-10-30 ENCOUNTER — Other Ambulatory Visit: Payer: Self-pay

## 2021-10-30 VITALS — BP 137/72 | HR 81 | Resp 18 | Ht 66.0 in | Wt 373.0 lb

## 2021-10-30 DIAGNOSIS — I5032 Chronic diastolic (congestive) heart failure: Secondary | ICD-10-CM | POA: Insufficient documentation

## 2021-10-30 DIAGNOSIS — E119 Type 2 diabetes mellitus without complications: Secondary | ICD-10-CM | POA: Insufficient documentation

## 2021-10-30 DIAGNOSIS — I110 Hypertensive heart disease with heart failure: Secondary | ICD-10-CM | POA: Insufficient documentation

## 2021-10-30 DIAGNOSIS — J9612 Chronic respiratory failure with hypercapnia: Secondary | ICD-10-CM | POA: Insufficient documentation

## 2021-10-30 DIAGNOSIS — F32A Depression, unspecified: Secondary | ICD-10-CM | POA: Insufficient documentation

## 2021-10-30 DIAGNOSIS — F419 Anxiety disorder, unspecified: Secondary | ICD-10-CM | POA: Insufficient documentation

## 2021-10-30 DIAGNOSIS — I10 Essential (primary) hypertension: Secondary | ICD-10-CM

## 2021-10-30 DIAGNOSIS — Z720 Tobacco use: Secondary | ICD-10-CM | POA: Diagnosis not present

## 2021-10-30 DIAGNOSIS — F17211 Nicotine dependence, cigarettes, in remission: Secondary | ICD-10-CM | POA: Insufficient documentation

## 2021-10-30 LAB — BASIC METABOLIC PANEL
Anion gap: 7 (ref 5–15)
BUN: 13 mg/dL (ref 6–20)
CO2: 28 mmol/L (ref 22–32)
Calcium: 8.7 mg/dL — ABNORMAL LOW (ref 8.9–10.3)
Chloride: 101 mmol/L (ref 98–111)
Creatinine, Ser: 0.66 mg/dL (ref 0.44–1.00)
GFR, Estimated: >60 mL/min (ref >60)
Glucose, Bld: 143 mg/dL — ABNORMAL HIGH (ref 70–99)
Potassium: 4.1 mmol/L (ref 3.5–5.1)
Sodium: 136 mmol/L (ref 135–145)

## 2021-10-30 MED ORDER — EMPAGLIFLOZIN 10 MG PO TABS: 10.0000 mg | ORAL_TABLET | Freq: Every day | ORAL | 5 refills | Status: DC

## 2021-10-30 MED ORDER — FUROSEMIDE 40 MG PO TABS: 40.0000 mg | ORAL_TABLET | Freq: Every day | ORAL | 5 refills | Status: DC

## 2021-10-30 MED ORDER — POTASSIUM CHLORIDE CRYS ER 20 MEQ PO TBCR: 20.0000 meq | EXTENDED_RELEASE_TABLET | Freq: Every day | ORAL | 5 refills | Status: DC

## 2021-10-30 NOTE — Patient Instructions (Addendum)
If you have voicemail, please make sure your mailbox is cleaned out so that we may leave a message and please make sure to listen to any voicemails.   ? ?Weigh daily, report weight gain of 3 lbs/day or 5 lbs/week. ? ?Limit TOTAL fluid intake to ~ 64 ounce/day. ? ?Limit TOTAL sodium to 2,000 mg/day.  ? ?Start taking lasix once/day. Start taking potassium once/day. Start taking Jardiance once/day. ? ?Follow up with getting a pulmonologist and a cardiologist. ? ?Return in 10 days.  ?

## 2021-10-31 ENCOUNTER — Ambulatory Visit: Payer: Medicaid Other | Admitting: Family

## 2021-11-05 ENCOUNTER — Inpatient Hospital Stay: Payer: Medicaid Other | Admitting: Family Medicine

## 2021-11-10 ENCOUNTER — Ambulatory Visit: Payer: Medicaid Other | Admitting: Family Medicine

## 2021-11-10 ENCOUNTER — Encounter: Payer: Self-pay | Admitting: Family Medicine

## 2021-11-10 VITALS — BP 142/76 | HR 113 | Temp 98.3°F | Resp 16 | Ht 67.0 in | Wt 376.1 lb

## 2021-11-10 DIAGNOSIS — E119 Type 2 diabetes mellitus without complications: Secondary | ICD-10-CM

## 2021-11-10 DIAGNOSIS — D229 Melanocytic nevi, unspecified: Secondary | ICD-10-CM

## 2021-11-10 DIAGNOSIS — J9621 Acute and chronic respiratory failure with hypoxia: Secondary | ICD-10-CM

## 2021-11-10 DIAGNOSIS — Z720 Tobacco use: Secondary | ICD-10-CM

## 2021-11-10 DIAGNOSIS — I5033 Acute on chronic diastolic (congestive) heart failure: Secondary | ICD-10-CM

## 2021-11-10 DIAGNOSIS — E785 Hyperlipidemia, unspecified: Secondary | ICD-10-CM

## 2021-11-10 DIAGNOSIS — Z6841 Body Mass Index (BMI) 40.0 and over, adult: Secondary | ICD-10-CM

## 2021-11-10 DIAGNOSIS — J9612 Chronic respiratory failure with hypercapnia: Secondary | ICD-10-CM

## 2021-11-10 DIAGNOSIS — J129 Viral pneumonia, unspecified: Secondary | ICD-10-CM

## 2021-11-10 DIAGNOSIS — B079 Viral wart, unspecified: Secondary | ICD-10-CM

## 2021-11-10 DIAGNOSIS — E662 Morbid (severe) obesity with alveolar hypoventilation: Secondary | ICD-10-CM

## 2021-11-10 DIAGNOSIS — F3342 Major depressive disorder, recurrent, in full remission: Secondary | ICD-10-CM

## 2021-11-10 DIAGNOSIS — G4701 Insomnia due to medical condition: Secondary | ICD-10-CM

## 2021-11-10 NOTE — Assessment & Plan Note (Signed)
Continue BiPAP. Recommend weight loss.  ?

## 2021-11-10 NOTE — Assessment & Plan Note (Signed)
Improved with lasix. Lungs clear today. Obtaining labs. ?

## 2021-11-10 NOTE — Assessment & Plan Note (Signed)
Multifactorial, 2/2 viral pneumonia, volume overload from diastolic HF, OHS. Improved with lasix, BiPAP, continue. Lungs clear today, euvolemic. Awaiting sleep studies. Continue to follow with Cardiology, Pulm. Work on weight loss. Discussed lifestyle changes today. Obtaining labs. F/u in 3 months. ?

## 2021-11-10 NOTE — Patient Instructions (Addendum)
It was great to see you! ? ?Our plans for today:  ?- See below for diet recommendations in diabetes. Any movement you are able to do can be helpful in trying to lose weight. ?- Let us know if you don't hear about an appointment with Dermatology in the next few weeks. ?- You can try a over the counter wart remover for the spot on your right cheek. It may take several applications of freezing to prevent recurrence.  ?- Come back in 3 months. ? ?We are checking some labs today, we will release these results to your MyChart. ? ?Take care and seek immediate care sooner if you develop any concerns.  ? ?Dr. Ky Barban ? ?Here is an example of what a healthy plate looks like: ? ? ? ?? Make half your plate fruits and vegetables. ? ?   ? Focus on whole fruits. ? ?   ? Vary your veggies. ? ?? Make half your grains whole grains. -  ?   ? Look for the word ?whole? at the beginning of the ingredients list ?   ? Some whole-grain ingredients include whole oats, whole-wheat flour,        whole-grain corn, whole-grain brown rice, and whole rye. ? ?? Move to low-fat and fat-free milk or yogurt. ? ?? Vary your protein routine. - Meat, fish, poultry (chicken, [place]), eggs, beans (kidney, pinto), dairy. ? ?? Drink and eat less sodium, saturated fat, and added sugars. ? ? ?Diet Recommendations for Diabetes  ? ?1. Eat at least 3 meals and 1-2 snacks per day. Never go more than 4-5 hours while awake without eating. Eat breakfast within the first hour of getting up.   ?2. Limit starchy foods to TWO per meal and ONE per snack. ONE portion of a starchy  food is equal to the following:  ? - ONE slice of bread (or its equivalent, such as half of a hamburger bun).  ? - 1/2 cup of a "scoopable" starchy food such as potatoes or rice.  ? - 15 grams of Total Carbohydrate as shown on food label.  ?3. Include at every meal: a protein food, a carb food, and vegetables and/or fruit.  ? - Obtain twice the volume of vegetables as protein or carbohydrate  foods for both lunch and dinner.  ? - Fresh or frozen vegetables are best.  ? - Keep frozen vegetables on hand for a quick vegetable serving.    ?  ? ?Starchy (carb) foods: Bread, rice, pasta, potatoes, corn, cereal, grits, crackers, bagels, muffins, all baked goods.  (Fruits, milk, and yogurt also have carbohydrate, but most of these foods will not spike your blood sugar as most starchy foods will.)  A few fruits do cause high blood sugars; use small portions of bananas (limit to 1/2 at a time), grapes, watermelon, oranges, and most tropical fruits.   ? ?Protein foods: Meat, fish, poultry, eggs, dairy foods, and beans such as pinto and kidney beans (beans also provide carbohydrate).  ?  ?  ? ?

## 2021-11-10 NOTE — Assessment & Plan Note (Signed)
Contributing to diastolic HF, OHS, chronic pain. Work on lifestyle changes through diet and exercise as able, f/u in 3 months to assess progress and discuss bariatric referral. Obtaining labs today.  ?

## 2021-11-10 NOTE — Assessment & Plan Note (Signed)
2/2 OHS, improved with BiPAP, continue. Await sleep study with Pulm. ?

## 2021-11-10 NOTE — Progress Notes (Signed)
? ?SUBJECTIVE:  ? ?CHIEF COMPLAINT / HPI:  ? ?HOSPITAL FOLLOW UP ?Hospital/facility: [facility] 2/26-3/2 ?Diagnosis: AoC resp failure with hypoxia 2/2 viral PNA, OHS ?Procedures/tests:  ?- ECHO - technically challenging. Wall motion and diastolic function assessment indeterminate. LVEF 55-60% with normal systolic function. ?- RVP +metapneumovirus ?Consultants: Pulm ?New medications:  ?- supplemental O2, BiPAP ?Discharge instructions:   ?Status: better ?- has f/u with Pulm for sleep study, 3/30 ?- saw HF clinic 3/9, started on lasix '40mg'$  and jardiance. Leg swelling and breathing improved.  ?- still SOB with exertion ?- reports compliance with symbicort. Using albuterol with prolonged distances. ?- walked from waiting room to scale and got out of breath. Not new, ongoing for about a year.  ?- reports better sleep with BiPAP ?- expresses interest in losing weight, interested in bariatric surgery.  ?- exercise difficult due to chronic knee and back pain and breathing difficulties. ? ?Mole ?- has multiple moles she wants removed. No abnormal or changing appearance but are bothersome with wearing mask and clothes. Wants derm referral.  ? ?OBJECTIVE:  ? ?BP (!) 142/76   Pulse (!) 113   Temp 98.3 ?F (36.8 ?C)   Resp 16   Ht '5\' 7"'$  (1.702 m)   Wt (!) 376 lb 1.6 oz (170.6 kg)   LMP 09/07/2015 (LMP Unknown) Comment: oophorectomy, no period since  SpO2 97% Comment: 2L  BMI 58.91 kg/m?   ?Gen: well appearing, in NAD. Obese. ?Card: RRR ?Lungs: CTAB ?Skin: multiple benign moles to face without suspicious appearance. Wart to R cheek.  ?Ext: WWP, no edema ? ?Cryotherapy  ?Procedure: Cryodestruction of: wart on R cheek ?Consent obtained and verified. ?Time-out conducted. ?Noted no overlying erythema, induration, or other signs of local infection. ?Completed without difficulty using Cryo-Gun. ?Advised to call if fevers/chills, erythema, induration, drainage, or persistent bleeding. ? ? ? ?ASSESSMENT/PLAN:  ? ?Problem List Items  Addressed This Visit   ? ?  ? Cardiovascular and Mediastinum  ? Acute exacerbation of CHF (congestive heart failure) ([place])  ?  Improved with lasix. Lungs clear today. Obtaining labs. ?  ?  ?  ? Respiratory  ? Acute on chronic respiratory failure with hypoxia ([facility])  ?  Multifactorial, 2/2 viral pneumonia, volume overload from diastolic HF, OHS. Improved with lasix, BiPAP, continue. Lungs clear today, euvolemic. Awaiting sleep studies. Continue to follow with Cardiology, Pulm. Work on weight loss. Discussed lifestyle changes today. Obtaining labs. F/u in 3 months. ?  ?  ? Chronic hypercapnic respiratory failure ([place])  ? Viral pneumonia  ?  ? Endocrine  ? Type 2 diabetes mellitus ([facility]) - Primary (Chronic)  ? Relevant Orders  ? Hemoglobin A1c  ? Lipid panel  ? Basic Metabolic Panel (BMET)  ?  ? Other  ? Insomnia  ?  2/2 OHS, improved with BiPAP, continue. Await sleep study with Pulm. ?  ?  ? Morbid obesity with BMI of 50.0-59.9, adult ([place])  ?  Contributing to diastolic HF, OHS, chronic pain. Work on lifestyle changes through diet and exercise as able, f/u in 3 months to assess progress and discuss bariatric referral. Obtaining labs today.  ?  ?  ? Obesity hypoventilation syndrome ([place])  ?  Continue BiPAP. Recommend weight loss.  ?  ?  ? Tobacco use  ? ?Other Visit Diagnoses   ? ? Benign mole      ? Relevant Orders  ? Ambulatory referral to Dermatology  ? Wart of face      ? ?  ? ? ?  ? ? ?  Myles Gip, DO ?

## 2021-11-11 LAB — LIPID PANEL
Cholesterol: 206 mg/dL — ABNORMAL HIGH (ref <200)
HDL: 35 mg/dL — ABNORMAL LOW (ref >=50)
LDL Cholesterol (Calc): 137 mg/dL (calc) — ABNORMAL HIGH
Non-HDL Cholesterol (Calc): 171 mg/dL (calc) — ABNORMAL HIGH (ref <130)
Total CHOL/HDL Ratio: 5.9 (calc) — ABNORMAL HIGH (ref <5.0)
Triglycerides: 203 mg/dL — ABNORMAL HIGH (ref <150)

## 2021-11-11 LAB — HEMOGLOBIN A1C
Hgb A1c MFr Bld: 7.3 % of total Hgb — ABNORMAL HIGH (ref <5.7)
Mean Plasma Glucose: 163 mg/dL
eAG (mmol/L): 9 mmol/L

## 2021-11-11 LAB — BASIC METABOLIC PANEL
BUN: 11 mg/dL (ref 7–25)
CO2: 28 mmol/L (ref 20–32)
Calcium: 9.1 mg/dL (ref 8.6–10.2)
Chloride: 102 mmol/L (ref 98–110)
Creat: 0.82 mg/dL (ref 0.50–0.99)
Glucose, Bld: 213 mg/dL — ABNORMAL HIGH (ref 65–99)
Potassium: 4.5 mmol/L (ref 3.5–5.3)
Sodium: 137 mmol/L (ref 135–146)

## 2021-11-11 MED ORDER — METFORMIN HCL ER 500 MG PO TB24: 1000.0000 mg | ORAL_TABLET | Freq: Every day | ORAL | 1 refills | Status: DC

## 2021-11-11 MED ORDER — ATORVASTATIN CALCIUM 10 MG PO TABS: ORAL_TABLET | ORAL | 3 refills | Status: DC

## 2021-11-11 NOTE — Addendum Note (Signed)
Addended by: Myles Gip on: 11/11/2021 08:48 AM ? ? Modules accepted: Orders ? ?

## 2021-11-11 NOTE — Addendum Note (Signed)
Addended by: Myles Gip on: 11/11/2021 04:11 PM ? ? Modules accepted: Orders ? ?

## 2021-11-13 ENCOUNTER — Ambulatory Visit: Payer: Medicaid Other | Admitting: Family

## 2021-11-13 ENCOUNTER — Telehealth: Payer: Self-pay | Admitting: Family

## 2021-11-13 NOTE — Telephone Encounter (Signed)
Patient did not show for her Heart Failure Clinic appointment on 11/13/21. Will attempt to reschedule.   ?

## 2021-11-20 ENCOUNTER — Ambulatory Visit (INDEPENDENT_AMBULATORY_CARE_PROVIDER_SITE_OTHER): Payer: Medicaid Other | Admitting: Internal Medicine

## 2021-11-20 ENCOUNTER — Encounter: Payer: Self-pay | Admitting: Internal Medicine

## 2021-11-20 VITALS — BP 138/86 | HR 99 | Temp 97.8°F | Ht 66.0 in | Wt 375.4 lb

## 2021-11-20 DIAGNOSIS — R0609 Other forms of dyspnea: Secondary | ICD-10-CM | POA: Diagnosis not present

## 2021-11-20 DIAGNOSIS — E662 Morbid (severe) obesity with alveolar hypoventilation: Secondary | ICD-10-CM | POA: Diagnosis not present

## 2021-11-20 DIAGNOSIS — J9621 Acute and chronic respiratory failure with hypoxia: Secondary | ICD-10-CM | POA: Diagnosis not present

## 2021-11-20 DIAGNOSIS — F17210 Nicotine dependence, cigarettes, uncomplicated: Secondary | ICD-10-CM

## 2021-11-20 DIAGNOSIS — Z6841 Body Mass Index (BMI) 40.0 and over, adult: Secondary | ICD-10-CM

## 2021-11-20 DIAGNOSIS — J9611 Chronic respiratory failure with hypoxia: Secondary | ICD-10-CM

## 2021-11-20 DIAGNOSIS — J9612 Chronic respiratory failure with hypercapnia: Secondary | ICD-10-CM

## 2021-11-20 DIAGNOSIS — G4733 Obstructive sleep apnea (adult) (pediatric): Secondary | ICD-10-CM | POA: Diagnosis not present

## 2021-11-20 MED ORDER — BREZTRI AEROSPHERE 160-9-4.8 MCG/ACT IN AERO: 2.0000 | INHALATION_SPRAY | Freq: Two times a day (BID) | RESPIRATORY_TRACT | 0 refills | Status: DC

## 2021-11-20 NOTE — Patient Instructions (Addendum)
Plan A = Automatic = Always=    Symbicort 160 (Breztri) Take 2 puffs first thing in am and then another 2 puffs about 12 hours later.  ?  ?Work on inhaler technique:  relax and gently blow all the way out then take a nice smooth full deep breath back in, triggering the inhaler at same time you start breathing in.  Hold for up to 5 seconds if you can. Blow out thru nose. Rinse and gargle with water when done.  If mouth or throat bother you at all,  try brushing teeth/gums/tongue with arm and hammer toothpaste/ make a slurry and gargle and spit out.  ? ?   ? ?Plan B = Backup (to supplement plan A, not to replace it) ?Only use your albuterol inhaler as a rescue medication to be used if you can't catch your breath by resting or doing a relaxed purse lip breathing pattern.  ?- The less you use it, the better it will work when you need it. ?- Ok to use the inhaler up to 2 puffs  every 4 hours if you must but call for appointment if use goes up over your usual need ?- Don't leave home without it !!  (think of it like the spare tire for your car)  ? ?We will refer you to adapt for BEST fit evaluation for portable 02 and we will walk you off and 02 as well  ? ?The key is to stop smoking completely before smoking completely stops you! ?  ? ?You will need follow up here by one of our sleep doctors and we will do a cpap/bipap titration study in the meantime  ? ? ?  ?

## 2021-11-20 NOTE — Progress Notes (Signed)
? ?Krista Mcgee, female    DOB: Jan 29, 1976    MRN: [id number] ? ? ?Brief patient profile:  ?21  yowf   active smoker  with MO/djd referred to pulmonary clinic in [facility]  11/20/2021 [facility] post admit ? ? ?[place] pfts restrictve only in 2019  with wt = 355 ? ?Admit date: 10/19/2021 ?Discharge date: 10/23/2021 ? ? ?Principal Problem: ?  Acute on chronic respiratory failure with hypoxia ([place]) ?  Insomnia ?  Major depressive disorder, recurrent, in full remission with anxious distress ([place]) ?  Hirsutism ?  High triglycerides ?  Hepatic steatosis ?  Morbid obesity with BMI of 60.0-69.9, adult ([place]) ?  Tobacco use ?  Acute exacerbation of CHF (congestive heart failure) ([place]) ?  Viral pneumonia ?  Chronic hypercapnic respiratory failure ([place]) ?  Obesity hypoventilation syndrome ([place]) ?  ?  ?  ?Hospital Course:  ?  ?Krista Mcgee is a 46 y.o. female with medical history significant for morbid obesity, hypertension, type II DM, depression, anxiety, presented to the hospital because of shortness of breath. ?  ?She was found to have oxygen saturation of 83% on room air and bronchopneumonia.  She was treated with oxygen via nasal cannula, BiPAP and empiric IV antibiotics.  Respiratory viral panel showed metapneumovirus which was likely the cause of her pneumonia.  Antibiotics were discontinued. ?   ?She has morbid obesity and ABG showed chronic hypercapnic respiratory failure.  Bedside spirometry showed FEV1/FVC ratio of 77.4. Chronic hypercapnia is likely from obesity hypoventilation syndrome causing restrictive thoracic disease.  She will be discharged home on trilogy machine for obesity hypoventilation syndrome.  She also has chronic hypoxic respiratory failure and needs 2 L/min oxygen via nasal cannula.  Follow-up with pulmonologist was recommended for sleep study.  ?  ?  ?History of Present Illness  ?11/20/2021  Pulmonary/ 1st office eval/ Greta Yung / [employer] new start 02 and bipap @ wt 375 ?Chief Complaint   ?Patient presents with  ? Hospitalization Follow-up  ?  Recent admission. Started on 2L and bipap ?C/o sob with exertion and dry cough.   ?Dyspnea:  onset 2018  ?Baseline prev on no 02 / 50 ft hc into store than ride scooter for shopping x years  ?Cough: none  ?Sleep: bipap bed is flat lots of pillows  ?SABA use: was not using symb prior to admit,  now on symb 160 2 each am  ? ?No obvious day to day or daytime variability or assoc excess/ purulent sputum or mucus plugs or hemoptysis or cp or chest tightness, subjective wheeze or overt sinus or hb symptoms.  ? ?Sleeping on bipap now without nocturnal  or early am exacerbation  of respiratory  c/o's or need for noct saba. Also denies any obvious fluctuation of symptoms with weather or environmental changes or other aggravating or alleviating factors except as outlined above  ? ?No unusual exposure hx or h/o childhood pna/ asthma or knowledge of premature birth. ? ?Current Allergies, Complete Past Medical History, Past Surgical History, Family History, and Social History were reviewed in [employer] record. ? ?ROS  The following are not active complaints unless bolded ?Hoarseness, sore throat, dysphagia, dental problems, itching, sneezing,  nasal congestion or discharge of excess mucus or purulent secretions, ear ache,   fever, chills, sweats, unintended wt loss or wt gain, classically pleuritic or exertional cp,  orthopnea pnd or arm/hand swelling  or leg swelling, presyncope, palpitations, abdominal pain, anorexia, nausea, vomiting,  diarrhea  or change in bowel habits or change in bladder habits, change in stools or change in urine, dysuria, hematuria,  rash, arthralgias, visual complaints, headache, numbness, weakness or ataxia or problems with walking or coordination,  change in mood or  memory. ?      ?   ? ?Past Medical History:  ?Diagnosis Date  ? Anxiety   ? Depression   ? Diabetes mellitus without complication ([place])   ? Hypertension    ? ? ?Outpatient Medications Prior to Visit  ?Medication Sig Dispense Refill  ? ABILIFY 5 MG tablet TAKE 1 AND 1/2 TABLETS BY MOUTH AT BEDTIME  5  ? albuterol (VENTOLIN HFA) 108 (90 Base) MCG/ACT inhaler Inhale 1-2 puffs into the lungs every 6 (six) hours as needed for wheezing or shortness of breath. 1 each 0  ? ALPRAZolam (XANAX) 1 MG tablet TAKE 1 TABLET BY MOUTH 3 TIMES A DAY AS NEEDED FOR ANXIETY  2  ? atorvastatin (LIPITOR) 10 MG tablet TAKE 1 TABLET BY MOUTH EVERYDAY. 90 tablet 3  ? budesonide-formoterol (SYMBICORT) 160-4.5 MCG/ACT inhaler Inhale 2 puffs into the lungs 2 (two) times daily. 1 each 0  ? buPROPion (WELLBUTRIN XL) 300 MG 24 hr tablet Take 300 mg by mouth every morning.  2  ? empagliflozin (JARDIANCE) 10 MG TABS tablet Take 1 tablet (10 mg total) by mouth daily before breakfast. 30 tablet 5  ? furosemide (LASIX) 40 MG tablet Take 1 tablet (40 mg total) by mouth daily. 30 tablet 5  ? metFORMIN (GLUCOPHAGE-XR) 500 MG 24 hr tablet Take 2 tablets (1,000 mg total) by mouth daily with breakfast. 180 tablet 1  ? potassium chloride SA (KLOR-CON M) 20 MEQ tablet Take 1 tablet (20 mEq total) by mouth daily. And additional if needed 50 tablet 5  ? venlafaxine XR (EFFEXOR-XR) 75 MG 24 hr capsule Take 225 mg by mouth every morning.    ? VRAYLAR 6 MG CAPS Take 1 capsule by mouth daily.    ? zolpidem (AMBIEN CR) 12.5 MG CR tablet Take 12.5 mg by mouth at bedtime.    ? ?No facility-administered medications prior to visit.  ? ? ? ?Objective:  ?  ? ?BP 138/86 (BP Location: Left Arm, Cuff Size: Large)   Pulse 99   Temp 97.8 ?F (36.6 ?C) (Temporal)   Ht '5\' 6"'$  (1.676 m)   Wt (!) 375 lb 6.4 oz (170.3 kg)   LMP 09/07/2015 (LMP Unknown) Comment: oophorectomy, no period since  SpO2 93%   BMI 60.59 kg/m?  ? ?SpO2: 93 % ?O2 Type: Continuous O2 ?O2 Flow Rate (L/min): 2 L/min ? ?Amb massively obese amb wf  ? ? HEENT : pt wearing mask not removed for exam due to covid -19 concerns.  ? ? ?NECK :  without JVD/Nodes/TM/ nl  carotid upstrokes bilaterally ? ? ?LUNGS: no acc muscle use,  Nl contour chest which is clear to A and P bilaterally without cough on insp or exp maneuvers ? ? ?CV:  RRR  no s3 or murmur or increase in P2, and 2+ pitting/ ? Lymphedema as well sym both LE's  ? ?ABD:  Massively and nontender   No bruits or organomegaly appreciated, bowel sounds nl ? ?MS:  Nl gait/ ext warm without deformities, calf tenderness, cyanosis or clubbing ?No obvious joint restrictions  ? ?SKIN: warm and dry with venous stasis dermatitis both LEs ? ?NEURO:  alert, approp, nl sensorium with  no motor or cerebellar deficits apparent.  ? ? ?   ?  Assessment  ? ?DOE (dyspnea on exertion) ?PFTs 03/08/18: FVC: 2.71 L (74 %pred), FEV1: 2.20 L (72 %pred), FEV1/FVC: 81%, TLC: 3.73 L (66 %pred), DLCO 53 %pred, DLCO/VA 141% predicted  @ wt 355  ? ?Pattern is c/w OHS in smoker with possible AB in active smoker  rx with symbicort 160 for now though not sure it's needed ? ?- The proper method of use, as well as anticipated side effects, of a metered-dose inhaler were discussed and demonstrated to the patient using teach back method. Improved effectiveness after extensive coaching during this visit to a level of approximately 75% from a baseline of 25 % > continue hfa for now/ sample of breztr x one given just to teach technique   ? ? ?Obesity hypoventilation syndrome ([place]) ?Continue trilogy as is, f/u sleep medicine with sleep study/ titration in meantime ? ? ?Chronic respiratory failure with hypoxia and hypercapnia ([facility]) ?New start on 02 2lpm 24/7 and bipap hs as of d/c 10/23/21  ?- referred to sleep medicine 11/20/2021 >>> ? ?Hypoxemic component likely related to basilar atx ? ? ?Morbid obesity with BMI of 60.0-69.9, adult ([place]) ?Body mass index is 60.59 kg/m?.    ?Lab Results  ?Component Value Date  ? TSH 1.910 10/19/2021  ? ?Contributing to doe and risk of GERD/DVT ?>>>   reviewed the need and the process to achieve and maintain neg calorie balance > defer  f/u primary care including intermittently monitoring thyroid status    ? ? ?Cigarette smoker ?Counseled re importance of smoking cessation but did not meet time criteria for separate billing   ? ? ?Each main

## 2021-11-21 ENCOUNTER — Encounter: Payer: Self-pay | Admitting: Internal Medicine

## 2021-11-21 DIAGNOSIS — F17210 Nicotine dependence, cigarettes, uncomplicated: Secondary | ICD-10-CM | POA: Insufficient documentation

## 2021-11-21 NOTE — Assessment & Plan Note (Signed)
Continue trilogy as is, f/u sleep medicine with sleep study/ titration in meantime ?

## 2021-11-21 NOTE — Assessment & Plan Note (Addendum)
Counseled re importance of smoking cessation but did not meet time criteria for separate billing   ? ? ?    ?  ? ?Each maintenance medication was reviewed in detail including emphasizing most importantly the difference between maintenance and prns and under what circumstances the prns are to be triggered using an action plan format where appropriate. ? ?Total time for H and P, chart review, counseling, reviewing hfa  device(s) and generating customized AVS unique to this initial office visit / same day charting = 48 min ?     ?

## 2021-11-21 NOTE — Assessment & Plan Note (Signed)
Body mass index is 60.59 kg/m?.    ?Lab Results  ?Component Value Date  ? TSH 1.910 10/19/2021  ?  ? ? ?Contributing to doe and risk of GERD/DVT ?>>>   reviewed the need and the process to achieve and maintain neg calorie balance > defer f/u primary care including intermittently monitoring thyroid status    ?

## 2021-11-21 NOTE — Assessment & Plan Note (Signed)
New start on 02 2lpm 24/7 and bipap hs as of d/c 10/23/21  ?- referred to sleep medicine 11/20/2021 >>> ? ?Hypoxemic component likely related to basilar atx ?

## 2021-11-21 NOTE — Assessment & Plan Note (Addendum)
PFTs 03/08/18: FVC: 2.71 L (74 %pred), FEV1: 2.20 L (72 %pred), FEV1/FVC: 81%, TLC: 3.73 L (66 %pred), DLCO 53 %pred, DLCO/VA 141% predicted  @ wt 355  ? ?Pattern is c/w OHS in smoker with possible AB in active smoker  rx with symbicort 160 for now though not sure it's needed ? ?- The proper method of use, as well as anticipated side effects, of a metered-dose inhaler were discussed and demonstrated to the patient using teach back method. Improved effectiveness after extensive coaching during this visit to a level of approximately 75% from a baseline of 25 % > continue hfa for now/ sample of breztr x one given just to teach technique   ?

## 2021-11-25 ENCOUNTER — Inpatient Hospital Stay: Payer: Medicaid Other | Admitting: Primary Care

## 2021-12-12 ENCOUNTER — Telehealth: Payer: Self-pay

## 2021-12-12 NOTE — Telephone Encounter (Signed)
Copied from [place] [id number]. Topic: General - Other >> Dec 12, 2021 11:08 AM Jodie Echevaria wrote: Reason for CRM: Merry Proud with [employer] called in to inquire about a CMN form faxed over on 12/09/2021 [place] will be on the CMN form any questions please call Merry Proud at Ph#  [phone] ext# [phone]

## 2021-12-12 NOTE — Telephone Encounter (Signed)
Have you seen this ?

## 2022-01-05 ENCOUNTER — Telehealth: Payer: Self-pay

## 2022-01-05 NOTE — Telephone Encounter (Signed)
Lm for patient to ask if she is currently wearing bipap. What is her DME? Does she have a SD card? ?

## 2022-01-06 NOTE — Telephone Encounter (Signed)
Lm x2 for patient.  Will close encounter per office protocol.   

## 2022-01-08 ENCOUNTER — Ambulatory Visit: Payer: Medicaid Other | Admitting: Adult Health

## 2022-02-10 ENCOUNTER — Ambulatory Visit: Payer: Medicaid Other | Admitting: Family Medicine

## 2022-02-10 DIAGNOSIS — Z5181 Encounter for therapeutic drug level monitoring: Secondary | ICD-10-CM

## 2022-02-10 DIAGNOSIS — E119 Type 2 diabetes mellitus without complications: Secondary | ICD-10-CM

## 2022-02-10 DIAGNOSIS — E785 Hyperlipidemia, unspecified: Secondary | ICD-10-CM

## 2022-05-07 ENCOUNTER — Ambulatory Visit: Payer: Medicaid Other | Admitting: Dermatology

## 2022-08-07 ENCOUNTER — Ambulatory Visit (INDEPENDENT_AMBULATORY_CARE_PROVIDER_SITE_OTHER): Payer: Medicaid Other | Admitting: Family Medicine

## 2022-08-07 ENCOUNTER — Encounter: Payer: Self-pay | Admitting: Family Medicine

## 2022-08-07 VITALS — BP 132/80 | HR 93 | Temp 97.5°F | Resp 16 | Ht 64.5 in | Wt 355.3 lb

## 2022-08-07 DIAGNOSIS — H6123 Impacted cerumen, bilateral: Secondary | ICD-10-CM | POA: Diagnosis not present

## 2022-08-07 DIAGNOSIS — H7391 Unspecified disorder of tympanic membrane, right ear: Secondary | ICD-10-CM

## 2022-08-07 NOTE — Progress Notes (Signed)
Patient ID: Krista Mcgee, female    DOB: [birth year], 46 y.o.   MRN: [id number]  PCP: Delsa Grana, PA-C  Chief Complaint  Patient presents with   Hearing Problem    Pt believes she has hearing loss in right ear unsure if its wax blocking. Onset for 3 months    Subjective:   Krista Mcgee is a 46 y.o. female, presents to clinic with CC of the following:  HPI  Right ear hearing decrease x 3 months, ongling for months, hx of cerumen impaction, she has tried removal with drops, irrigation, curettes, she uses q-tips, some mild pain on and off right ear, no drainage   Patient Active Problem List   Diagnosis Date Noted   Cigarette smoker 11/21/2021   Obesity hypoventilation syndrome ([place]) 10/23/2021   Chronic respiratory failure with hypoxia and hypercapnia ([place]) 10/22/2021   Viral pneumonia 10/21/2021   Acute respiratory failure with hypoxia ([place]) 10/20/2021   Acute exacerbation of CHF (congestive heart failure) ([place]) 10/20/2021   Morbid obesity with BMI of 60.0-69.9, adult ([place]) 10/19/2021   Acute on chronic respiratory failure with hypoxia (HCC) 10/19/2021   Tobacco use 10/19/2021   Inappropriately high serum insulin 10/25/2018   Leukocytosis 10/17/2018   Fluttering heart 07/10/2018   Abnormal EKG 07/10/2018   Hepatic steatosis 04/18/2018   Diverticulosis 04/18/2018   Lumbar disc disease with radiculopathy 03/03/2018   Knee pain, bilateral 03/03/2018   Hip pain, chronic, right 03/03/2018   Low HDL (under 40) 03/29/2017   High triglycerides 03/29/2017   Type 2 diabetes mellitus ([place]) 03/19/2017   Hirsutism 03/19/2017   Pap smear for cervical cancer screening 03/19/2017   Vitamin D deficiency 03/10/2017   DOE (dyspnea on exertion) 03/09/2017   Medication monitoring encounter 03/09/2017   Morbid obesity with BMI of 50.0-59.9, adult ([place]) 07/29/2015   Insomnia 07/29/2015   Major depressive disorder, recurrent, in full remission with anxious distress ([place]) 07/29/2015    Snoring 07/29/2015   Encounter for cholesteral screening for cardiovascular disease 07/29/2015   Other fatigue 07/29/2015      Current Outpatient Medications:    ABILIFY 5 MG tablet, TAKE 1 AND 1/2 TABLETS BY MOUTH AT BEDTIME, Disp: , Rfl: 5   albuterol (VENTOLIN HFA) 108 (90 Base) MCG/ACT inhaler, Inhale 1-2 puffs into the lungs every 6 (six) hours as needed for wheezing or shortness of breath., Disp: 1 each, Rfl: 0   ALPRAZolam (XANAX) 1 MG tablet, TAKE 1 TABLET BY MOUTH 3 TIMES A DAY AS NEEDED FOR ANXIETY, Disp: , Rfl: 2   atorvastatin (LIPITOR) 10 MG tablet, TAKE 1 TABLET BY MOUTH EVERYDAY., Disp: 90 tablet, Rfl: 3   Budeson-Glycopyrrol-Formoterol (BREZTRI AEROSPHERE) 160-9-4.8 MCG/ACT AERO, Inhale 2 puffs into the lungs in the morning and at bedtime., Disp: 5.9 g, Rfl: 0   budesonide-formoterol (SYMBICORT) 160-4.5 MCG/ACT inhaler, Inhale 2 puffs into the lungs 2 (two) times daily., Disp: 1 each, Rfl: 0   buPROPion (WELLBUTRIN XL) 300 MG 24 hr tablet, Take 300 mg by mouth every morning., Disp: , Rfl: 2   desvenlafaxine (PRISTIQ) 100 MG 24 hr tablet, Take 100 mg by mouth daily., Disp: , Rfl:    empagliflozin (JARDIANCE) 10 MG TABS tablet, Take 1 tablet (10 mg total) by mouth daily before breakfast., Disp: 30 tablet, Rfl: 5   metFORMIN (GLUCOPHAGE-XR) 500 MG 24 hr tablet, Take 2 tablets (1,000 mg total) by mouth daily with breakfast., Disp: 180 tablet, Rfl: 1   potassium chloride SA (KLOR-CON M)  20 MEQ tablet, Take 1 tablet (20 mEq total) by mouth daily. And additional if needed, Disp: 50 tablet, Rfl: 5   venlafaxine XR (EFFEXOR-XR) 75 MG 24 hr capsule, Take 225 mg by mouth every morning., Disp: , Rfl:    VRAYLAR 6 MG CAPS, Take 1 capsule by mouth daily., Disp: , Rfl:    zolpidem (AMBIEN CR) 12.5 MG CR tablet, Take 12.5 mg by mouth at bedtime., Disp: , Rfl:    furosemide (LASIX) 40 MG tablet, Take 1 tablet (40 mg total) by mouth daily., Disp: 30 tablet, Rfl: 5   No Known  Allergies   Social History   Tobacco Use   Smoking status: Some Days    Packs/day: 1.00    Years: 26.00    Total pack years: 26.00    Types: Cigarettes   Smokeless tobacco: Former   Tobacco comments:    3 cigarettes in the past 3 weeks. 3/30/203  Vaping Use   Vaping Use: Never used  Substance Use Topics   Alcohol use: Yes    Alcohol/week: 0.0 standard drinks of alcohol    Comment: occasionally   Drug use: No      Chart Review Today: I personally reviewed active problem list, medication list, allergies, family history, social history, health maintenance, notes from last encounter, lab results, imaging with the patient/caregiver today.   Review of Systems  Constitutional: Negative.   HENT: Negative.    Eyes: Negative.   Respiratory: Negative.    Cardiovascular: Negative.   Gastrointestinal: Negative.   Endocrine: Negative.   Genitourinary: Negative.   Musculoskeletal: Negative.   Skin: Negative.   Allergic/Immunologic: Negative.   Neurological: Negative.   Hematological: Negative.   Psychiatric/Behavioral: Negative.    All other systems reviewed and are negative.      Objective:   Vitals:   08/07/22 1453  BP: 132/80  Pulse: 93  Resp: 16  Temp: (!) 97.5 F (36.4 C)  TempSrc: Oral  SpO2: 96%  Weight: (!) 355 lb 4.8 oz (161.2 kg)  Height: 5' 4.5" (1.638 m)    Body mass index is 60.05 kg/m.  Physical Exam Vitals and nursing note reviewed.  Constitutional:      Appearance: She is well-developed. She is obese.  HENT:     Head: Normocephalic and atraumatic.     Right Ear: Ear canal and external ear normal. There is impacted cerumen.     Left Ear: Ear canal and external ear normal. There is impacted cerumen.     Ears:     Comments: Visualized portions of both outer year and first part of canal normal appearing, b/l cerumen impaction with copious dark brown to black cerumen    Nose: Nose normal.  Eyes:     General:        Right eye: No discharge.         Left eye: No discharge.     Conjunctiva/sclera: Conjunctivae normal.  Neck:     Trachea: No tracheal deviation.  Cardiovascular:     Rate and Rhythm: Normal rate and regular rhythm.  Pulmonary:     Effort: Pulmonary effort is normal. No respiratory distress.     Breath sounds: No stridor.  Musculoskeletal:        General: Normal range of motion.  Skin:    General: Skin is warm and dry.     Findings: No rash.  Neurological:     Mental Status: She is alert.     Motor: No abnormal muscle  tone.     Coordination: Coordination normal.  Psychiatric:        Behavior: Behavior normal.     Indication: Cerumen impaction of the ear(s)  Medical necessity statement: On physical examination, cerumen impairs clinically significant portions of the external auditory canal, and tympanic membrane. Noted obstructive, copious cerumen that cannot be removed without magnification and instrumentations requiring MD/APP skills/procedure  Consent: Discussed benefits and risks of procedure and verbal consent obtained  Procedure:  Cerumen Disimpaction  Patient was prepped for the procedure.  Utilized an otoscope to assess and take note of the ear canal, the tympanic membrane, and the presence, amount, and placement of the cerumen.  Gentle ear lavage with warm water and hydrogen peroxide performed on the right and left ear.   Soft plastic curette was also utilized to remove cerumen with direct visualization.   Post procedure examination: shows cerumen was completely removed. Patient tolerated procedure well.  There were no complications and following the disimpaction the tympanic membrane was  visible on the right , TM intact, Left TM not visible with persistent obstructing cerumen Auditory canal on right are inflamed, left normal appearing Pt tolerated procedure, reported improvement of symptoms after removal of cerumen.  Increased right hearing TM abnormal with central opaque yellow to white and  translucent edges with normal cone of light - suspected cholesteatoma - central on right TM roughly 75% of TM   The patient is made aware that they may experience temporary vertigo, temporary hearing loss, and temporary discomfort.  If these symptom last for more than 24 hours to follow up in clinic.     Assessment & Plan:     ICD-10-CM   1. Bilateral impacted cerumen  H61.23 EAR CERUMEN REMOVAL   right successfully removed and TM visualized hearing improved - see below - persistent left impaction - drops at home and return if repeated procedure needed    2. Abnormal tympanic membrane of right ear  H73.91    suspected cholesteatoma to majority of right TM         Delsa Grana, PA-C 08/07/22 3:00 PM

## 2022-08-07 NOTE — Patient Instructions (Signed)

## 2022-09-28 ENCOUNTER — Telehealth: Payer: Self-pay

## 2022-09-28 NOTE — Telephone Encounter (Signed)
Lm for patient to ask if she currently wears cpap. If so, who is DME or does she has SD card in machine?

## 2022-09-29 ENCOUNTER — Ambulatory Visit: Payer: Medicaid Other | Admitting: Nurse Practitioner

## 2022-09-29 NOTE — Telephone Encounter (Signed)
Spoke to patient. She stated that she wears bipap nightly. She would like to reschedule appt due to grandchild being sick. She is willing to travel to [place] to be seen by the end of the month. Appt scheduled 10/08/2022 at 9:00. Address provided.  Nothing further needed.

## 2022-09-29 NOTE — Telephone Encounter (Signed)
Lm x2 for patient.  Will close encounter per office protocol.   

## 2022-10-08 ENCOUNTER — Ambulatory Visit: Payer: Medicaid Other | Admitting: Nurse Practitioner

## 2022-10-13 ENCOUNTER — Ambulatory Visit: Payer: Medicaid Other | Admitting: Family Medicine

## 2022-10-13 ENCOUNTER — Encounter: Payer: Self-pay | Admitting: Family Medicine

## 2022-10-13 VITALS — BP 134/86 | HR 98 | Temp 98.1°F | Resp 16 | Ht 64.5 in | Wt 367.3 lb

## 2022-10-13 DIAGNOSIS — E1165 Type 2 diabetes mellitus with hyperglycemia: Secondary | ICD-10-CM | POA: Diagnosis not present

## 2022-10-13 DIAGNOSIS — J9612 Chronic respiratory failure with hypercapnia: Secondary | ICD-10-CM

## 2022-10-13 DIAGNOSIS — E538 Deficiency of other specified B group vitamins: Secondary | ICD-10-CM | POA: Insufficient documentation

## 2022-10-13 DIAGNOSIS — E785 Hyperlipidemia, unspecified: Secondary | ICD-10-CM | POA: Diagnosis not present

## 2022-10-13 DIAGNOSIS — R0609 Other forms of dyspnea: Secondary | ICD-10-CM

## 2022-10-13 DIAGNOSIS — R5383 Other fatigue: Secondary | ICD-10-CM

## 2022-10-13 DIAGNOSIS — F3342 Major depressive disorder, recurrent, in full remission: Secondary | ICD-10-CM

## 2022-10-13 DIAGNOSIS — Z720 Tobacco use: Secondary | ICD-10-CM

## 2022-10-13 DIAGNOSIS — I509 Heart failure, unspecified: Secondary | ICD-10-CM

## 2022-10-13 DIAGNOSIS — Z5181 Encounter for therapeutic drug level monitoring: Secondary | ICD-10-CM

## 2022-10-13 DIAGNOSIS — E66813 Obesity, class 3: Secondary | ICD-10-CM

## 2022-10-13 DIAGNOSIS — Z1231 Encounter for screening mammogram for malignant neoplasm of breast: Secondary | ICD-10-CM

## 2022-10-13 DIAGNOSIS — Z6841 Body Mass Index (BMI) 40.0 and over, adult: Secondary | ICD-10-CM

## 2022-10-13 DIAGNOSIS — E662 Morbid (severe) obesity with alveolar hypoventilation: Secondary | ICD-10-CM

## 2022-10-13 DIAGNOSIS — E559 Vitamin D deficiency, unspecified: Secondary | ICD-10-CM

## 2022-10-13 DIAGNOSIS — Z79899 Other long term (current) drug therapy: Secondary | ICD-10-CM | POA: Insufficient documentation

## 2022-10-13 DIAGNOSIS — R202 Paresthesia of skin: Secondary | ICD-10-CM

## 2022-10-13 DIAGNOSIS — J9611 Chronic respiratory failure with hypoxia: Secondary | ICD-10-CM

## 2022-10-13 NOTE — Assessment & Plan Note (Signed)
per provider in [place] - reviewed PDMP  Unclear what else she is taking - we will need to request authorization to get OV notes/med list and clarify who is managing meds and clarify diagnosis Hx of MDD and insomnia on chart - dx likely missing if someone is prescribing chronic benzos - not sure if she is on wellbutrin or other meds on list since most meds do not show she has gotten them filled in the past year

## 2022-10-13 NOTE — Assessment & Plan Note (Signed)
Stopped smoking and vaping completely in the past 2 months

## 2022-10-13 NOTE — Assessment & Plan Note (Signed)
Multifactorial - OHV, chronic respiratory failure, COPD?, CHF, morbid obesity, deconditioning

## 2022-10-13 NOTE — Assessment & Plan Note (Signed)
Signed orders for supplies at home She is using 2L O2 for when ambulating Reviewed her dx and need for pulm f/up and continued care

## 2022-10-13 NOTE — Assessment & Plan Note (Signed)
Dx previously on chart is diastolic heart failure, she has not really est w/ and followed with cardiology since hospitalization last year She was previously put on lasix- but cannot get refills from cardiology due to no OV and labs Reest with cardiology She has not sudden or recent increase of LE edema, orthopnea, PND or DOE - though chronic sx

## 2022-10-13 NOTE — Assessment & Plan Note (Signed)
Mild deficiency in the past that we will recheck today

## 2022-10-13 NOTE — Assessment & Plan Note (Signed)
Not currently on statins, but she has been on lipitor before and tolerated it With T2DM she should be back on - discussed rechecking labs and getting back on meds

## 2022-10-13 NOTE — Assessment & Plan Note (Addendum)
Uncontrolled - she has not been into office regularly since 2020 to manage DM foot exam and labs done today She has been on metformin, jardiance, ozempic before, currently not on meds and no home glucometer Discussed care - need for statin, ACEI/ARB, DM eye exam Some things done today, she will return in the next couple weeks to review labs and decide management going forward. Lab Results  Component Value Date   HGBA1C 7.3 (H) 11/10/2021   After checking labs/renal function will try to get her back on metformin (discussed and pt agreed today) and then try to add on ozempic - would help weight, she was also previously on jardiance (other meds may be indicated with pulm/cardiac hx/changes)

## 2022-10-13 NOTE — Assessment & Plan Note (Signed)
She has been working on diet Interested in bariatric referral

## 2022-10-13 NOTE — Assessment & Plan Note (Signed)
Per pulm - but she needs to reestablish She is interested in addressing and improving weight - asks for help with referrals

## 2022-10-13 NOTE — Assessment & Plan Note (Signed)
She is seeing specialists who is refilling controlled substances but its unclear what other meds she is currently on  Multiple meds on chart - abilify, vraylar, wellbutrin, pristique, xanax etc    10/13/2022   10:21 AM 08/07/2022    2:47 PM 11/10/2021   10:54 AM  Depression screen PHQ 2/9  Decreased Interest 0 0 3  Down, Depressed, Hopeless 0 0 3  PHQ - 2 Score 0 0 6  Altered sleeping 0 0 0  Tired, decreased energy 0 0 0  Change in appetite 0 0 0  Feeling bad or failure about yourself  0 0 0  Trouble concentrating 0 0 0  Moving slowly or fidgety/restless 0 0 0  Suicidal thoughts 0 0 0  PHQ-9 Score 0 0 6  Difficult doing work/chores Not difficult at all Not difficult at all Not difficult at all   With meds in chart history she likely had mood disorder or other dx which we do not have updated - will need to discuss with pt and update and next visit and ask if she will allow [place] to get/share records with her managing specialists

## 2022-10-13 NOTE — Progress Notes (Signed)
Patient ID: Krista Mcgee, female    DOB: Jul 29, 1976, 47 y.o.   MRN: [id number]  PCP: Delsa Grana, PA-C  Chief Complaint  Patient presents with   [facility]    Needs medical paperwork completed    Subjective:   Krista Mcgee is a 47 y.o. female, presents to clinic with CC of the following:  HPI  Here for f/up and needs medical paperwork completed Hx of chronic respiratory failure, OHS, OSA, CHF I saw pt Dec 2021 and then not again until about 3 months ago for acute ear complaints She saw Dr. Ky Barban last year March Hospitalization 10/16/2021 for respiratory failure - other wise her last PCP was Dr. Sanda Klein and around [place] pandemic in 2020 she was largely lost to routine f/up care here at PCP office and PCP moved out of the area.  Multiple ED and hospitalizations in 2020 and 2021  When I saw her once in office in 2021 she presented for LE edema - at that time I noted hx of smoking, prior pulm consult managed on symbicort and albuterol with morbid obesity and DOE that was gradually worsening dyspnea even at rest  I do not know when she first needed chronic O2 After 09/2021 admission it looks like she did f/up with cardiology for new dx of CHF and pulm but has not seen them regularly since She did f/up with Dr. Ky Barban in office  [place]: [facility] 2/26-3/2 Diagnosis: AoC resp failure with hypoxia 2/2 viral PNA, OHS Procedures/tests:  - ECHO - technically challenging. Wall motion and diastolic function assessment indeterminate. LVEF 55-60% with normal systolic function. - RVP +metapneumovirus Consultants: Pulm New medications:  - supplemental O2, BiPAP Discharge instructions:   Status: better - has f/u with Pulm for sleep study, 3/30 - saw HF clinic 3/9, started on lasix 29m and jardiance. Leg swelling and breathing improved.  - still SOB with exertion - reports compliance with symbicort. Using albuterol with prolonged distances. - walked from  waiting room to scale and got out of breath. Not new, ongoing for about a year.  - reports better sleep with BiPAP - expresses interest in losing weight, interested in bariatric surgery.  - exercise difficult due to chronic knee and back pain and breathing difficulties.  After that she did not f/up with cardiology over the past year I see one pulmonology f/up with Dr. WMelvyn Novas3/30/2023 They note the following: Dyspnea:  onset 2018  Baseline prev on no 02 / 50 ft hc into store than ride scooter for shopping x years  DOE (dyspnea on exertion) PFTs 03/08/18: FVC: 2.71 L (74 %pred), FEV1: 2.20 L (72 %pred), FEV1/FVC: 81%, TLC: 3.73 L (66 %pred), DLCO 53 %pred, DLCO/VA 141% predicted  @ wt 355    Pattern is c/w OHS in smoker with possible AB in active smoker  rx with symbicort 160 for now though not sure it's needed   - The proper method of use, as well as anticipated side effects, of a metered-dose inhaler were discussed and demonstrated to the patient using teach back method. Improved effectiveness after extensive coaching during this visit to a level of approximately 75% from a baseline of 25 % > continue hfa for now/ sample of breztr x one given just to teach technique       Obesity hypoventilation syndrome ([place] Continue trilogy as is, f/u sleep medicine with sleep study/ titration in meantime     Chronic respiratory failure with hypoxia and hypercapnia ([place]  New start on 02 2lpm 24/7 and bipap hs as of d/c 10/23/21  - referred to sleep medicine 11/20/2021 >>>   Hypoxemic component likely related to basilar atx     Morbid obesity with BMI of 60.0-69.9, adult ([place]) Body mass index is 60.59 kg/m.         Lab Results  Component Value Date    TSH 1.910 10/19/2021    Contributing to doe and risk of GERD/DVT >>>   reviewed the need and the process to achieve and maintain neg calorie balance > defer f/u primary care including intermittently monitoring thyroid status        Cigarette  smoker Counseled re importance of smoking cessation but did not meet time criteria for separate billing  Last pulm OV - stated she needed O2 2L with ambulation, need for portable O2 and and home O2 condenser  Smoker - quit completely a few months ago - though at least 28 pack year hx     Patient Active Problem List   Diagnosis Date Noted   Vitamin B12 deficiency 10/13/2022   Chronic prescription benzodiazepine use 10/13/2022   Obesity hypoventilation syndrome ([place]) 10/23/2021   Chronic respiratory failure with hypoxia and hypercapnia ([place]) 10/22/2021   Congestive heart failure ([place]) 10/20/2021   Class 3 severe obesity with serious comorbidity and body mass index (BMI) of 60.0 to 69.9 in adult ([place]) 10/19/2021   Tobacco use 10/19/2021   Hepatic steatosis 04/18/2018   Lumbar disc disease with radiculopathy 03/03/2018   Knee pain, bilateral 03/03/2018   Hip pain, chronic, right 03/03/2018   Hyperlipidemia 03/29/2017   Type 2 diabetes mellitus ([place]) 03/19/2017   Hirsutism 03/19/2017   Vitamin D deficiency 03/10/2017   DOE (dyspnea on exertion) 03/09/2017   Insomnia 07/29/2015   Major depressive disorder, recurrent, in full remission with anxious distress ([place]) 07/29/2015    Pt unlikely to be on most of these medications - verified she is getting xanax rx regularly and dosed TID   Current Outpatient Medications:    ABILIFY 5 MG tablet, TAKE 1 AND 1/2 TABLETS BY MOUTH AT BEDTIME, Disp: , Rfl: 5   albuterol (VENTOLIN HFA) 108 (90 Base) MCG/ACT inhaler, Inhale 1-2 puffs into the lungs every 6 (six) hours as needed for wheezing or shortness of breath., Disp: 1 each, Rfl: 0   ALPRAZolam (XANAX) 1 MG tablet, TAKE 1 TABLET BY MOUTH 3 TIMES A DAY AS NEEDED FOR ANXIETY, Disp: , Rfl: 2   atorvastatin (LIPITOR) 10 MG tablet, TAKE 1 TABLET BY MOUTH EVERYDAY., Disp: 90 tablet, Rfl: 3   Budeson-Glycopyrrol-Formoterol (BREZTRI AEROSPHERE) 160-9-4.8 MCG/ACT AERO, Inhale 2 puffs into the lungs in  the morning and at bedtime., Disp: 5.9 g, Rfl: 0   budesonide-formoterol (SYMBICORT) 160-4.5 MCG/ACT inhaler, Inhale 2 puffs into the lungs 2 (two) times daily., Disp: 1 each, Rfl: 0   buPROPion (WELLBUTRIN XL) 300 MG 24 hr tablet, Take 300 mg by mouth every morning., Disp: , Rfl: 2   desvenlafaxine (PRISTIQ) 100 MG 24 hr tablet, Take 100 mg by mouth daily., Disp: , Rfl:    empagliflozin (JARDIANCE) 10 MG TABS tablet, Take 1 tablet (10 mg total) by mouth daily before breakfast., Disp: 30 tablet, Rfl: 5   metFORMIN (GLUCOPHAGE-XR) 500 MG 24 hr tablet, Take 2 tablets (1,000 mg total) by mouth daily with breakfast., Disp: 180 tablet, Rfl: 1   potassium chloride SA (KLOR-CON M) 20 MEQ tablet, Take 1 tablet (20 mEq total) by mouth daily. And additional if  needed, Disp: 50 tablet, Rfl: 5   venlafaxine XR (EFFEXOR-XR) 75 MG 24 hr capsule, Take 225 mg by mouth every morning., Disp: , Rfl:    VRAYLAR 6 MG CAPS, Take 1 capsule by mouth daily., Disp: , Rfl:    zolpidem (AMBIEN CR) 12.5 MG CR tablet, Take 12.5 mg by mouth at bedtime., Disp: , Rfl:    furosemide (LASIX) 40 MG tablet, Take 1 tablet (40 mg total) by mouth daily., Disp: 30 tablet, Rfl: 5   No Known Allergies   Social History   Tobacco Use   Smoking status: Former    Packs/day: 1.00    Years: 28.00    Total pack years: 28.00    Types: Cigarettes    Quit date: 08/12/2022    Years since quitting: 0.1   Smokeless tobacco: Former   Tobacco comments:    Vaping with nicotine 1 year with light cirgarette smoking, prior to that 1 ppd cig smoking started smoking 47 years old so roughly 1 ppd 27-28 years   Vaping Use   Vaping Use: Never used  Substance Use Topics   Alcohol use: Yes    Alcohol/week: 0.0 standard drinks of alcohol    Comment: occasionally   Drug use: No      Chart Review Today: I personally reviewed active problem list, medication list, allergies, family history, social history, health maintenance, notes from last  encounter, lab results, imaging with the patient/caregiver today.   Review of Systems  Constitutional: Negative.   HENT: Negative.    Eyes: Negative.   Respiratory: Negative.    Cardiovascular: Negative.   Gastrointestinal: Negative.   Endocrine: Negative.   Genitourinary: Negative.   Musculoskeletal: Negative.   Skin: Negative.   Allergic/Immunologic: Negative.   Neurological: Negative.   Hematological: Negative.   Psychiatric/Behavioral: Negative.    All other systems reviewed and are negative.      Objective:   Vitals:   10/13/22 1023  BP: 134/86  Pulse: 98  Resp: 16  Temp: 98.1 F (36.7 C)  TempSrc: Oral  SpO2: 96%  Weight: (!) 367 lb 4.8 oz (166.6 kg)  Height: 5' 4.5" (1.638 m)    Body mass index is 62.07 kg/m.  Physical Exam Vitals and nursing note reviewed.  Constitutional:      General: She is not in acute distress.    Appearance: Normal appearance. She is well-developed and well-groomed. She is morbidly obese. She is not ill-appearing, toxic-appearing or diaphoretic.     Interventions: Nasal cannula in place.  HENT:     Head: Normocephalic and atraumatic.     Right Ear: External ear normal.     Left Ear: External ear normal.  Eyes:     General: No scleral icterus.       Right eye: No discharge.        Left eye: No discharge.     Conjunctiva/sclera: Conjunctivae normal.  Cardiovascular:     Rate and Rhythm: Normal rate and regular rhythm.     Pulses: Normal pulses.     Heart sounds: Normal heart sounds. No murmur heard.    No friction rub. No gallop.  Pulmonary:     Effort: Pulmonary effort is normal. No respiratory distress.     Breath sounds: Normal breath sounds. No stridor. No wheezing or rales.  Skin:    General: Skin is warm.     Coloration: Skin is not pale.  Neurological:     Mental Status: She is alert. Mental status is  at baseline.  Psychiatric:        Mood and Affect: Mood normal.        Behavior: Behavior normal.       Results for orders placed or performed in visit on [id number]  Basic Metabolic Panel (BMET)  Result Value Ref Range   Glucose, Bld 213 (H) 65 - 99 mg/dL   BUN 11 7 - 25 mg/dL   Creat 0.82 0.50 - 0.99 mg/dL   BUN/Creatinine Ratio NOT APPLICABLE 6 - 22 (calc)   Sodium 137 135 - 146 mmol/L   Potassium 4.5 3.5 - 5.3 mmol/L   Chloride 102 98 - 110 mmol/L   CO2 28 20 - 32 mmol/L   Calcium 9.1 8.6 - 10.2 mg/dL  Lipid panel  Result Value Ref Range   Cholesterol 206 (H) <200 mg/dL   HDL 35 (L) > OR = 50 mg/dL   Triglycerides 203 (H) <150 mg/dL   LDL Cholesterol (Calc) 137 (H) mg/dL (calc)   Total CHOL/HDL Ratio 5.9 (H) <5.0 (calc)   Non-HDL Cholesterol (Calc) 171 (H) <130 mg/dL (calc)  Hemoglobin A1c  Result Value Ref Range   Hgb A1c MFr Bld 7.3 (H) <5.7 % of total Hgb   Mean Plasma Glucose 163 mg/dL   eAG (mmol/L) 9.0 mmol/L   Lab Results  Component Value Date   VITAMINB12 358 03/09/2017       Assessment & Plan:   Pt presents to get O2 supply orders signed - needed face to face visit She has largely been lost to all care since prior PCP left in 2020 - sig health changes and worsening over the past couple years Last OV for routine f/up was one year ago - but that was really a hospital f/up and she has not really addressed her chronic conditions or followed with specialists for years Multiple uncontrolled conditions She verbally reported not being on DM meds, statin We did not discuss mental health or associated meds - but those do seem to be refilled regularly (at least the benzos that I can verify on PDMP)   Pt will get all labs today and return in a few weeks to address labs and get tx plan for chronic conditions She also needs to reestablish with pulm and cardiology  Problem List Items Addressed This Visit       Cardiovascular and Mediastinum   Congestive heart failure (HCC)    Dx previously on chart is diastolic heart failure, she has not really est w/ and followed with  cardiology since hospitalization last year She was previously put on lasix- but cannot get refills from cardiology due to no OV and labs Reest with cardiology She has not sudden or recent increase of LE edema, orthopnea, PND or DOE - though chronic sx      Relevant Orders   Ambulatory referral to Cardiology     Respiratory   Chronic respiratory failure with hypoxia and hypercapnia ([place]) - Primary    Signed orders for supplies at home She is using 2L O2 for when ambulating Reviewed her dx and need for pulm f/up and continued care      Relevant Orders   CBC with Differential/Platelet   COMPLETE METABOLIC PANEL WITH GFR   Ambulatory referral to Pulmonology     Endocrine   Type 2 diabetes mellitus ([place]) (Chronic)    Uncontrolled - she has not been into office regularly since 2020 to manage DM foot exam and labs done today She has been on  metformin, jardiance, ozempic before, currently not on meds and no home glucometer Discussed care - need for statin, ACEI/ARB, DM eye exam Some things done today, she will return in the next couple weeks to review labs and decide management going forward. Lab Results  Component Value Date   HGBA1C 7.3 (H) 11/10/2021  After checking labs/renal function will try to get her back on metformin (discussed and pt agreed today) and then try to add on ozempic - would help weight, she was also previously on jardiance (other meds may be indicated with pulm/cardiac hx/changes)      Relevant Orders   CBC with Differential/Platelet   COMPLETE METABOLIC PANEL WITH GFR   Hemoglobin A1c   Lipid panel   Microalbumin / creatinine urine ratio   Ambulatory referral to Cardiology   Ambulatory referral to Ophthalmology     Other   Major depressive disorder, recurrent, in full remission with anxious distress ([place])    She is seeing specialists who is refilling controlled substances but its unclear what other meds she is currently on  Multiple meds on chart -  abilify, vraylar, wellbutrin, pristique, xanax etc    10/13/2022   10:21 AM 08/07/2022    2:47 PM 11/10/2021   10:54 AM  Depression screen PHQ 2/9  Decreased Interest 0 0 3  Down, Depressed, Hopeless 0 0 3  PHQ - 2 Score 0 0 6  Altered sleeping 0 0 0  Tired, decreased energy 0 0 0  Change in appetite 0 0 0  Feeling bad or failure about yourself  0 0 0  Trouble concentrating 0 0 0  Moving slowly or fidgety/restless 0 0 0  Suicidal thoughts 0 0 0  PHQ-9 Score 0 0 6  Difficult doing work/chores Not difficult at all Not difficult at all Not difficult at all  With meds in chart history she likely had mood disorder or other dx which we do not have updated - will need to discuss with pt and update and next visit and ask if she will allow [place] to get/share records with her managing specialists       DOE (dyspnea on exertion)    Multifactorial - OHV, chronic respiratory failure, COPD?, CHF, morbid obesity, deconditioning      Relevant Orders   CBC with Differential/Platelet   COMPLETE METABOLIC PANEL WITH GFR   Ambulatory referral to Cardiology   Vitamin D deficiency   Relevant Orders   COMPLETE METABOLIC PANEL WITH GFR   VITAMIN D 25 Hydroxy (Vit-D Deficiency, Fractures)   Hyperlipidemia    Not currently on statins, but she has been on lipitor before and tolerated it With T2DM she should be back on - discussed rechecking labs and getting back on meds      Relevant Orders   CBC with Differential/Platelet   COMPLETE METABOLIC PANEL WITH GFR   Lipid panel   Ambulatory referral to Cardiology   Class 3 severe obesity with serious comorbidity and body mass index (BMI) of 60.0 to 69.9 in adult [facility])    She has been working on diet Interested in bariatric referral      Relevant Orders   CBC with Differential/Platelet   COMPLETE METABOLIC PANEL WITH GFR   Hemoglobin A1c   TSH   Ambulatory referral to Pulmonology   Ambulatory referral to Cardiology   Tobacco use    Stopped smoking  and vaping completely in the past 2 months      Relevant Orders   Ambulatory referral  to Pulmonology   Obesity hypoventilation syndrome ([place])    Per pulm - but she needs to reestablish She is interested in addressing and improving weight - asks for help with referrals      Relevant Orders   CBC with Differential/Platelet   COMPLETE METABOLIC PANEL WITH GFR   Ambulatory referral to Pulmonology   Vitamin B12 deficiency    Mild deficiency in the past that we will recheck today      Relevant Orders   Vitamin B12   Chronic prescription benzodiazepine use    per provider in [place] - reviewed PDMP  Unclear what else she is taking - we will need to request authorization to get OV notes/med list and clarify who is managing meds and clarify diagnosis Hx of MDD and insomnia on chart - dx likely missing if someone is prescribing chronic benzos - not sure if she is on wellbutrin or other meds on list since most meds do not show she has gotten them filled in the past year      Other Visit Diagnoses     Paresthesia       upper and lower extremities   Fatigue, unspecified type       multiple uncontrolled conditions and deficiencies   Encounter for medication monitoring       Relevant Orders   CBC with Differential/Platelet   COMPLETE METABOLIC PANEL WITH GFR   Hemoglobin A1c   TSH   Lipid panel   Microalbumin / creatinine urine ratio   VITAMIN D 25 Hydroxy (Vit-D Deficiency, Fractures)   Encounter for screening mammogram for malignant neoplasm of breast       Relevant Orders   MM 3D SCREEN BREAST BILATERAL       Return in about 1 month (around 11/11/2022) for Routine follow-up - DM/HLD/lab review with me .     Delsa Grana, PA-C 10/13/22 1:04 PM

## 2022-10-14 LAB — COMPLETE METABOLIC PANEL WITH GFR
AG Ratio: 1.2 (calc) (ref 1.0–2.5)
ALT: 19 U/L (ref 6–29)
AST: 24 U/L (ref 10–30)
Albumin: 3.6 g/dL (ref 3.6–5.1)
Alkaline phosphatase (APISO): 80 U/L (ref 31–125)
BUN: 12 mg/dL (ref 7–25)
CO2: 28 mmol/L (ref 20–32)
Calcium: 8.8 mg/dL (ref 8.6–10.2)
Chloride: 101 mmol/L (ref 98–110)
Creat: 0.77 mg/dL (ref 0.50–0.99)
Globulin: 3.1 g/dL (calc) (ref 1.9–3.7)
Glucose, Bld: 296 mg/dL — ABNORMAL HIGH (ref 65–99)
Potassium: 4.2 mmol/L (ref 3.5–5.3)
Sodium: 137 mmol/L (ref 135–146)
Total Bilirubin: 0.4 mg/dL (ref 0.2–1.2)
Total Protein: 6.7 g/dL (ref 6.1–8.1)
eGFR: 97 mL/min/{1.73_m2} (ref >=60)

## 2022-10-14 LAB — VITAMIN D 25 HYDROXY (VIT D DEFICIENCY, FRACTURES): Vit D, 25-Hydroxy: 10 ng/mL — ABNORMAL LOW (ref 30–100)

## 2022-10-14 LAB — LIPID PANEL
Cholesterol: 190 mg/dL (ref <200)
HDL: 32 mg/dL — ABNORMAL LOW (ref >=50)
LDL Cholesterol (Calc): 117 mg/dL (calc) — ABNORMAL HIGH
Non-HDL Cholesterol (Calc): 158 mg/dL (calc) — ABNORMAL HIGH (ref <130)
Total CHOL/HDL Ratio: 5.9 (calc) — ABNORMAL HIGH (ref <5.0)
Triglycerides: 311 mg/dL — ABNORMAL HIGH (ref <150)

## 2022-10-14 LAB — CBC WITH DIFFERENTIAL/PLATELET
Absolute Monocytes: 309 cells/uL (ref 200–950)
Basophils Absolute: 52 cells/uL (ref 0–200)
Basophils Relative: 0.5 %
Eosinophils Absolute: 402 cells/uL (ref 15–500)
Eosinophils Relative: 3.9 %
HCT: 37.9 % (ref 35.0–45.0)
Hemoglobin: 12.6 g/dL (ref 11.7–15.5)
Lymphs Abs: 2853 cells/uL (ref 850–3900)
MCH: 29.1 pg (ref 27.0–33.0)
MCHC: 33.2 g/dL (ref 32.0–36.0)
MCV: 87.5 fL (ref 80.0–100.0)
MPV: 9.1 fL (ref 7.5–12.5)
Monocytes Relative: 3 %
Neutro Abs: 6685 cells/uL (ref 1500–7800)
Neutrophils Relative %: 64.9 %
Platelets: 233 10*3/uL (ref 140–400)
RBC: 4.33 10*6/uL (ref 3.80–5.10)
RDW: 13.4 % (ref 11.0–15.0)
Total Lymphocyte: 27.7 %
WBC: 10.3 10*3/uL (ref 3.8–10.8)

## 2022-10-14 LAB — MICROALBUMIN / CREATININE URINE RATIO
Creatinine, Urine: 138 mg/dL (ref 20–275)
Microalb Creat Ratio: 3 mcg/mg creat (ref <30)
Microalb, Ur: 0.4 mg/dL

## 2022-10-14 LAB — HEMOGLOBIN A1C
Hgb A1c MFr Bld: 7.7 % of total Hgb — ABNORMAL HIGH (ref <5.7)
Mean Plasma Glucose: 174 mg/dL
eAG (mmol/L): 9.7 mmol/L

## 2022-10-14 LAB — TSH: TSH: 2.24 mIU/L

## 2022-10-14 LAB — VITAMIN B12: Vitamin B-12: 354 pg/mL (ref 200–1100)

## 2022-10-15 ENCOUNTER — Encounter: Payer: Self-pay | Admitting: Family Medicine

## 2022-10-15 ENCOUNTER — Other Ambulatory Visit: Payer: Self-pay | Admitting: Family Medicine

## 2022-10-15 DIAGNOSIS — E559 Vitamin D deficiency, unspecified: Secondary | ICD-10-CM

## 2022-10-15 MED ORDER — VITAMIN D (ERGOCALCIFEROL) 1.25 MG (50000 UNIT) PO CAPS: 50000.0000 [IU] | ORAL_CAPSULE | ORAL | 0 refills | Status: DC

## 2022-10-19 ENCOUNTER — Telehealth: Payer: Self-pay | Admitting: Family Medicine

## 2022-10-19 ENCOUNTER — Other Ambulatory Visit: Payer: Self-pay

## 2022-10-19 NOTE — Telephone Encounter (Signed)
Copied from [place] [id number]. Topic: General - Inquiry >> Oct 19, 2022  2:12 PM Erskine Squibb wrote: Reason for CRM: The patient called in stating she was to call Maricela back with a list of medications she is currently taking. Please assist patient further

## 2022-10-19 NOTE — Telephone Encounter (Signed)
Called pt back and went over all medication list together

## 2022-10-21 NOTE — Telephone Encounter (Unsigned)
Copied from [place] [id number]. Topic: General - Other >> Oct 21, 2022 10:24 AM [place] B wrote: Reason for CRM: [facility] says needs office notes from 03/01 to present to supply oxygen supply. Fx [phone]

## 2022-10-21 NOTE — Telephone Encounter (Signed)
Faxed to [facility]

## 2022-11-09 ENCOUNTER — Emergency Department
Admission: EM | Admit: 2022-11-09 | Discharge: 2022-11-09 | Disposition: A | Discharge status: Home / Self Care | Payer: Medicaid Other | Attending: Emergency Medicine | Admitting: Emergency Medicine

## 2022-11-09 ENCOUNTER — Emergency Department: Payer: Medicaid Other

## 2022-11-09 ENCOUNTER — Other Ambulatory Visit: Payer: Self-pay

## 2022-11-09 DIAGNOSIS — E119 Type 2 diabetes mellitus without complications: Secondary | ICD-10-CM | POA: Insufficient documentation

## 2022-11-09 DIAGNOSIS — I509 Heart failure, unspecified: Secondary | ICD-10-CM | POA: Insufficient documentation

## 2022-11-09 DIAGNOSIS — X58XXXA Exposure to other specified factors, initial encounter: Secondary | ICD-10-CM | POA: Diagnosis not present

## 2022-11-09 DIAGNOSIS — I110 Hypertensive heart disease with heart failure: Secondary | ICD-10-CM | POA: Diagnosis not present

## 2022-11-09 DIAGNOSIS — S8992XA Unspecified injury of left lower leg, initial encounter: Secondary | ICD-10-CM | POA: Diagnosis present

## 2022-11-09 DIAGNOSIS — J810 Acute pulmonary edema: Secondary | ICD-10-CM | POA: Insufficient documentation

## 2022-11-09 DIAGNOSIS — S86812A Strain of other muscle(s) and tendon(s) at lower leg level, left leg, initial encounter: Secondary | ICD-10-CM | POA: Diagnosis not present

## 2022-11-09 LAB — CBC
HCT: 40.6 % (ref 36.0–46.0)
Hemoglobin: 13.3 g/dL (ref 12.0–15.0)
MCH: 28.9 pg (ref 26.0–34.0)
MCHC: 32.8 g/dL (ref 30.0–36.0)
MCV: 88.3 fL (ref 80.0–100.0)
Platelets: 246 10*3/uL (ref 150–400)
RBC: 4.6 MIL/uL (ref 3.87–5.11)
RDW: 14.3 % (ref 11.5–15.5)
WBC: 12.2 10*3/uL — ABNORMAL HIGH (ref 4.0–10.5)
nRBC: 0 % (ref 0.0–0.2)

## 2022-11-09 LAB — BASIC METABOLIC PANEL
Anion gap: 10 (ref 5–15)
BUN: 10 mg/dL (ref 6–20)
CO2: 25 mmol/L (ref 22–32)
Calcium: 9.1 mg/dL (ref 8.9–10.3)
Chloride: 101 mmol/L (ref 98–111)
Creatinine, Ser: 0.75 mg/dL (ref 0.44–1.00)
GFR, Estimated: >60 mL/min (ref >60)
Glucose, Bld: 169 mg/dL — ABNORMAL HIGH (ref 70–99)
Potassium: 3.4 mmol/L — ABNORMAL LOW (ref 3.5–5.1)
Sodium: 136 mmol/L (ref 135–145)

## 2022-11-09 LAB — TROPONIN I (HIGH SENSITIVITY): Troponin I (High Sensitivity): 5 ng/L (ref <18)

## 2022-11-09 MED ORDER — FUROSEMIDE 40 MG PO TABS
20.0000 mg | ORAL_TABLET | Freq: Once | ORAL | Status: AC
  Administered 2022-11-09: 20 mg via ORAL
  Filled 2022-11-09: qty 1

## 2022-11-09 MED ORDER — FUROSEMIDE 20 MG PO TABS: 20.0000 mg | ORAL_TABLET | Freq: Every day | ORAL | 0 refills | Status: DC

## 2022-11-09 MED ORDER — POTASSIUM CHLORIDE CRYS ER 20 MEQ PO TBCR
40.0000 meq | EXTENDED_RELEASE_TABLET | Freq: Once | ORAL | Status: AC
  Administered 2022-11-09: 40 meq via ORAL
  Filled 2022-11-09: qty 2

## 2022-11-09 NOTE — ED Triage Notes (Signed)
Pt presents to ER with c/o left posterior leg pain that started yesterday while she was walking to her car.  Pt states this morning, she woke up with some sob/tightness in her chest.  Pt endorses increased swelling to left leg. No redness, or warmth to the touch. Denies hx of blood clots, but does have hx of CHF.  Pt is otherwise A&O x4, ambulatory to triage and speaking in complete sentences.

## 2022-11-09 NOTE — ED Provider Notes (Signed)
[facility] Provider Note    Event Date/Time   First MD Initiated Contact with Patient 11/09/22 2116     (approximate)   History   Chief Complaint Shortness of Breath and Leg Swelling   HPI  Krista Mcgee is a 47 y.o. female with past medical history of hypertension, hyperlipidemia, diabetes, CHF, and obesity hypoventilation who presents to the ED complaining of leg swelling.  Patient reports that she started to have pain in her left calf yesterday after standing up to get out of the car.  Since then, she has noticed that the leg is slightly swollen compared to the right and she has pain especially when dorsiflexing her left foot.  She has not noticed any redness or rash to the leg.  She reports becoming more concerned when she had some tightness and difficulty breathing waking up this morning.  Difficulty breathing has since improved and she denies any shortness of breath at rest.  She has not had any ongoing chest pain since this morning and denies any recent fevers or cough.  She does not currently take a diuretic for CHF.     Physical Exam   Triage Vital Signs: ED Triage Vitals  Enc Vitals Group     BP 11/09/22 1913 (!) 165/86     Pulse Rate 11/09/22 1913 96     Resp 11/09/22 1913 20     Temp 11/09/22 1913 98.4 F (36.9 C)     Temp Source 11/09/22 1913 Oral     SpO2 11/09/22 1913 96 %     Weight 11/09/22 1922 (!) 365 lb (165.6 kg)     Height 11/09/22 1922 5\' 4"  (1.626 m)     Head Circumference --      Peak Flow --      Pain Score 11/09/22 1922 4     Pain Loc --      Pain Edu? --      Excl. in [place]? --     Most recent vital signs: Vitals:   11/09/22 1913 11/09/22 2120  BP: (!) 165/86 (!) 153/78  Pulse: 96 85  Resp: 20 (!) 21  Temp: 98.4 F (36.9 C)   SpO2: 96% 96%    Constitutional: Alert and oriented. Eyes: Conjunctivae are normal. Head: Atraumatic. Nose: No congestion/rhinnorhea. Mouth/Throat: Mucous membranes are moist.   Cardiovascular: Normal rate, regular rhythm. Grossly normal heart sounds.  2+ radial and DP pulses bilaterally. Respiratory: Normal respiratory effort.  No retractions. Lungs CTAB. Gastrointestinal: Soft and nontender. No distention. Musculoskeletal: 1+ pitting edema to knees bilaterally, tenderness to palpation noted at left calf with no overlying erythema or warmth. Neurologic:  Normal speech and language. No gross focal neurologic deficits are appreciated.    ED Results / Procedures / Treatments   Labs (all labs ordered are listed, but only abnormal results are displayed) Labs Reviewed  CBC - Abnormal; Notable for the following components:      Result Value   WBC 12.2 (*)    All other components within normal limits  BASIC METABOLIC PANEL - Abnormal; Notable for the following components:   Potassium 3.4 (*)    Glucose, Bld 169 (*)    All other components within normal limits  TROPONIN I (HIGH SENSITIVITY)     EKG  ED ECG REPORT I, Blake Divine, the attending physician, personally viewed and interpreted this ECG.   Date: 11/09/2022  EKG Time: 19:13  Rate: 94  Rhythm: normal sinus rhythm  Axis:  Normal  Intervals: Incomplete RBBB  ST&T Change: None  RADIOLOGY Chest x-ray reviewed and interpreted by me with mild pulmonary edema, no focal infiltrate or effusion noted.  PROCEDURES:  Critical Care performed: No  Procedures   MEDICATIONS ORDERED IN ED: Medications  furosemide (LASIX) tablet 20 mg (has no administration in time range)  potassium chloride SA (KLOR-CON M) CR tablet 40 mEq (has no administration in time range)     IMPRESSION / MDM / ASSESSMENT AND PLAN / ED COURSE  I reviewed the triage vital signs and the nursing notes.                              47 y.o. female with past medical history of hypertension, hyperlipidemia, diabetes, CHF, and obesity hypoventilation syndrome who presents to the ED complaining of about 24 hours of increasing pain  in her left calf associated with leg swelling and episode of chest tightness and difficulty breathing earlier this morning.  Patient's presentation is most consistent with acute presentation with potential threat to life or bodily function.  Differential diagnosis includes, but is not limited to, DVT, PE, ACS, pneumonia, pneumothorax, CHF exacerbation, muscle strain.  Patient well-appearing and in no acute distress, vital signs are unremarkable.  Patient denies any shortness of breath currently and is maintaining oxygen saturations at 96% on room air.  She is neurovascular intact to her distal left lower extremity and ultrasound shows no evidence of DVT.  She does have tenderness to palpation at her calf that is worse with dorsiflexion of the foot, suspect she strained her calf muscle when getting up out of her car.  Given no evidence of DVT, I have low suspicion for PE.  EKG shows no evidence of arrhythmia or ischemia and troponin within normal limits, doubt ACS.  She does have mild pulmonary edema on her chest x-ray, which is likely the source of her difficulty breathing earlier.  We will give dose of Lasix here in the ED but patient is appropriate for outpatient management and follow-up in the heart failure clinic.  We will provide prescription for 5 days of Lasix and she was counseled to return to the ED for new or worsening symptoms, patient agrees with plan.      FINAL CLINICAL IMPRESSION(S) / ED DIAGNOSES   Final diagnoses:  Strain of calf muscle, left, initial encounter  Acute pulmonary edema ([place])     Rx / DC Orders   ED Discharge Orders          Ordered    AMB referral to CHF clinic        11/09/22 2145    furosemide (LASIX) 20 MG tablet  Daily        11/09/22 2145             Note:  This document was prepared using Dragon voice recognition software and may include unintentional dictation errors.   Blake Divine, MD 11/09/22 2151

## 2022-11-11 ENCOUNTER — Telehealth: Payer: Self-pay | Admitting: Family

## 2022-11-11 NOTE — Telephone Encounter (Signed)
Per secure message from [place] ...   Please call to schedule ED f/u with me. Thanks    Called patient x2 to schedule. LVM x2 phone goes straight to VM

## 2022-11-13 ENCOUNTER — Ambulatory Visit: Payer: Medicaid Other | Admitting: Family Medicine

## 2022-11-13 DIAGNOSIS — E785 Hyperlipidemia, unspecified: Secondary | ICD-10-CM

## 2022-11-13 DIAGNOSIS — E1165 Type 2 diabetes mellitus with hyperglycemia: Secondary | ICD-10-CM

## 2022-11-19 ENCOUNTER — Telehealth: Payer: Self-pay

## 2022-11-19 NOTE — Telephone Encounter (Signed)
Pt did not show for her 3/23 appt with Darylene Price FNP.  Called pt to attempt to reschedule. Pt stated that she will call clinic at a later time to schedule an appointment but will not do at this time.  Will route to [employer] as FYI.

## 2022-11-30 ENCOUNTER — Ambulatory Visit (INDEPENDENT_AMBULATORY_CARE_PROVIDER_SITE_OTHER): Payer: Medicaid Other | Admitting: Family Medicine

## 2022-11-30 VITALS — BP 138/74 | HR 91 | Temp 98.6°F | Resp 16 | Ht 64.5 in | Wt 360.4 lb

## 2022-11-30 DIAGNOSIS — E1165 Type 2 diabetes mellitus with hyperglycemia: Secondary | ICD-10-CM | POA: Diagnosis not present

## 2022-11-30 DIAGNOSIS — E785 Hyperlipidemia, unspecified: Secondary | ICD-10-CM | POA: Diagnosis not present

## 2022-11-30 DIAGNOSIS — I509 Heart failure, unspecified: Secondary | ICD-10-CM

## 2022-11-30 DIAGNOSIS — E119 Type 2 diabetes mellitus without complications: Secondary | ICD-10-CM | POA: Diagnosis not present

## 2022-11-30 DIAGNOSIS — J9612 Chronic respiratory failure with hypercapnia: Secondary | ICD-10-CM

## 2022-11-30 DIAGNOSIS — E559 Vitamin D deficiency, unspecified: Secondary | ICD-10-CM

## 2022-11-30 DIAGNOSIS — J9611 Chronic respiratory failure with hypoxia: Secondary | ICD-10-CM

## 2022-11-30 MED ORDER — ALBUTEROL SULFATE HFA 108 (90 BASE) MCG/ACT IN AERS: 1.0000 | INHALATION_SPRAY | Freq: Four times a day (QID) | RESPIRATORY_TRACT | 2 refills | Status: DC | PRN

## 2022-11-30 MED ORDER — EMPAGLIFLOZIN 10 MG PO TABS: 10.0000 mg | ORAL_TABLET | Freq: Every day | ORAL | 1 refills | Status: DC

## 2022-11-30 MED ORDER — BREZTRI AEROSPHERE 160-9-4.8 MCG/ACT IN AERO: 2.0000 | INHALATION_SPRAY | Freq: Two times a day (BID) | RESPIRATORY_TRACT | 1 refills | Status: DC

## 2022-11-30 MED ORDER — POTASSIUM CHLORIDE CRYS ER 20 MEQ PO TBCR: 20.0000 meq | EXTENDED_RELEASE_TABLET | Freq: Every day | ORAL | 1 refills | Status: DC

## 2022-11-30 MED ORDER — FUROSEMIDE 20 MG PO TABS: 20.0000 mg | ORAL_TABLET | Freq: Every morning | ORAL | 1 refills | Status: DC

## 2022-11-30 MED ORDER — SEMAGLUTIDE(0.25 OR 0.5MG/DOS) 2 MG/3ML ~~LOC~~ SOPN: PEN_INJECTOR | SUBCUTANEOUS | 0 refills | Status: AC

## 2022-11-30 MED ORDER — METFORMIN HCL ER 500 MG PO TB24: 1000.0000 mg | ORAL_TABLET | Freq: Every day | ORAL | 1 refills | Status: DC

## 2022-11-30 MED ORDER — ATORVASTATIN CALCIUM 10 MG PO TABS: 10.0000 mg | ORAL_TABLET | Freq: Every day | ORAL | 3 refills | Status: DC

## 2022-11-30 NOTE — Progress Notes (Signed)
Name: Krista Mcgee   MRN: [id number]    DOB: [birth year]   Date:11/30/2022       Progress Note  Chief Complaint  Patient presents with   Follow-up   Hyperlipidemia   Diabetes     Subjective:   Krista Mcgee is a 47 y.o. female, presents to clinic for f/up and to review labs  CHF with LE edema, she hasn't gotten back into cardiology yet, but did do ED visit 3/18, given 5 d of lasix rx and fluid came off and breathing was better   Was on lasix 20 mf daily x 5 d, she feels she needs to resume this daily, since fluid reaccumulates, weight goes up and DOE worsens as soon as she is off lasix for a few days She was previously on lasix daily and monitoring with cardiology - she has routine OV appt with cardiology coming up later this month   On Rx supplement  Last vitamin D Lab Results  Component Value Date   VD25OH 10 (L) 10/13/2022   Inhalers/orders for O2 -   B12 deficiency Lab Results  Component Value Date   VITAMINB12 354 10/13/2022     Current Outpatient Medications:    ALPRAZolam (XANAX) 1 MG tablet, TAKE 1 TABLET BY MOUTH 3 TIMES A DAY AS NEEDED FOR ANXIETY, Disp: , Rfl: 2   buPROPion (WELLBUTRIN XL) 300 MG 24 hr tablet, Take 300 mg by mouth every morning., Disp: , Rfl: 2   desvenlafaxine (PRISTIQ) 100 MG 24 hr tablet, Take 100 mg by mouth daily., Disp: , Rfl:    Vitamin D, Ergocalciferol, (DRISDOL) 1.25 MG (50000 UNIT) CAPS capsule, Take 1 capsule (50,000 Units total) by mouth every 7 (seven) days. x12 weeks., Disp: 12 capsule, Rfl: 0   VRAYLAR 6 MG CAPS, Take 1 capsule by mouth daily., Disp: , Rfl:    zolpidem (AMBIEN CR) 12.5 MG CR tablet, Take 12.5 mg by mouth at bedtime., Disp: , Rfl:    albuterol (VENTOLIN HFA) 108 (90 Base) MCG/ACT inhaler, Inhale 1-2 puffs into the lungs every 6 (six) hours as needed for wheezing or shortness of breath. (Patient not taking: Reported on 11/30/2022), Disp: 1 each, Rfl: 0   atorvastatin (LIPITOR) 10 MG tablet, TAKE 1 TABLET BY  MOUTH EVERYDAY. (Patient not taking: Reported on 10/13/2022), Disp: 90 tablet, Rfl: 3   Budeson-Glycopyrrol-Formoterol (BREZTRI AEROSPHERE) 160-9-4.8 MCG/ACT AERO, Inhale 2 puffs into the lungs in the morning and at bedtime. (Patient not taking: Reported on 10/13/2022), Disp: 5.9 g, Rfl: 0   empagliflozin (JARDIANCE) 10 MG TABS tablet, Take 1 tablet (10 mg total) by mouth daily before breakfast. (Patient not taking: Reported on 10/13/2022), Disp: 30 tablet, Rfl: 5   furosemide (LASIX) 20 MG tablet, Take 1 tablet (20 mg total) by mouth daily for 5 days., Disp: 5 tablet, Rfl: 0   metFORMIN (GLUCOPHAGE-XR) 500 MG 24 hr tablet, Take 2 tablets (1,000 mg total) by mouth daily with breakfast. (Patient not taking: Reported on 10/13/2022), Disp: 180 tablet, Rfl: 1  Patient Active Problem List   Diagnosis Date Noted   Vitamin B12 deficiency 10/13/2022   Chronic prescription benzodiazepine use 10/13/2022   Obesity hypoventilation syndrome 10/23/2021   Chronic respiratory failure with hypoxia and hypercapnia 10/22/2021   Congestive heart failure 10/20/2021   Class 3 severe obesity with serious comorbidity and body mass index (BMI) of 60.0 to 69.9 in adult 10/19/2021   Tobacco use 10/19/2021   Hepatic steatosis 04/18/2018   Lumbar disc  disease with radiculopathy 03/03/2018   Knee pain, bilateral 03/03/2018   Hip pain, chronic, right 03/03/2018   Hyperlipidemia 03/29/2017   Type 2 diabetes mellitus 03/19/2017   Hirsutism 03/19/2017   Vitamin D deficiency 03/10/2017   DOE (dyspnea on exertion) 03/09/2017   Insomnia 07/29/2015   Major depressive disorder, recurrent, in full remission with anxious distress 07/29/2015    Past Surgical History:  Procedure Laterality Date   CHOLECYSTECTOMY     INCONTINENCE SURGERY     OOPHORECTOMY     TUBAL LIGATION      Family History  Problem Relation Age of Onset   Cancer Mother        ovarian   Other Father        tumor in his ear drum   Diabetes Father     Heart disease Father    Hyperlipidemia Father    Hypertension Father    Kidney disease Daughter        hydronephrosis   Arthritis Paternal Grandmother    COPD Paternal Grandmother    Emphysema Paternal Grandmother    Heart disease Paternal Grandmother    Cancer Paternal Grandfather        skin   COPD Paternal Grandfather    Emphysema Paternal Grandfather     Social History   Tobacco Use   Smoking status: Former    Packs/day: 1.00    Years: 28.00    Additional pack years: 0.00    Total pack years: 28.00    Types: Cigarettes    Quit date: 08/12/2022    Years since quitting: 0.3   Smokeless tobacco: Former   Tobacco comments:    Vaping with nicotine 1 year with light cirgarette smoking, prior to that 1 ppd cig smoking started smoking 47 years old so roughly 1 ppd 27-28 years   Vaping Use   Vaping Use: Never used  Substance Use Topics   Alcohol use: Yes    Alcohol/week: 0.0 standard drinks of alcohol    Comment: occasionally   Drug use: No     No Known Allergies  Health Maintenance  Topic Date Due   OPHTHALMOLOGY EXAM  Never done   MAMMOGRAM  Never done   PAP SMEAR-Modifier  06/09/2021   COVID-19 Vaccine (1) 12/16/2022 (Originally 02/06/1981)   INFLUENZA VACCINE  03/25/2023   HEMOGLOBIN A1C  04/13/2023   Diabetic kidney evaluation - Urine ACR  10/14/2023   FOOT EXAM  10/14/2023   Diabetic kidney evaluation - eGFR measurement  11/09/2023   Hepatitis C Screening  Completed   HIV Screening  Completed   HPV VACCINES  Aged Out   DTaP/Tdap/Td  Discontinued    Chart Review Today: I personally reviewed active problem list, medication list, allergies, family history, social history, health maintenance, notes from last encounter, lab results, imaging with the patient/caregiver today.   Review of Systems  Constitutional: Negative.   HENT: Negative.    Eyes: Negative.   Respiratory: Negative.    Cardiovascular: Negative.   Gastrointestinal: Negative.   Endocrine:  Negative.   Genitourinary: Negative.   Musculoskeletal: Negative.   Skin: Negative.   Allergic/Immunologic: Negative.   Neurological: Negative.   Hematological: Negative.   Psychiatric/Behavioral: Negative.    All other systems reviewed and are negative.    Objective:   Vitals:   11/30/22 1505  BP: 138/74  Pulse: 91  Resp: 16  Temp: 98.6 F (37 C)  TempSrc: Oral  SpO2: 95%  Weight: (!) 360 lb 6.4 oz (163.5  kg)  Height: 5' 4.5" (1.638 m)    Body mass index is 60.91 kg/m.  Physical Exam Vitals and nursing note reviewed.  Constitutional:      General: She is not in acute distress.    Appearance: Normal appearance. She is well-developed and well-groomed. She is morbidly obese. She is not ill-appearing, toxic-appearing or diaphoretic.     Interventions: Nasal cannula in place.  HENT:     Head: Normocephalic and atraumatic.     Right Ear: External ear normal.     Left Ear: External ear normal.  Eyes:     General: No scleral icterus.       Right eye: No discharge.        Left eye: No discharge.     Conjunctiva/sclera: Conjunctivae normal.  Cardiovascular:     Rate and Rhythm: Normal rate and regular rhythm.     Pulses: Normal pulses.     Heart sounds: Normal heart sounds. No murmur heard.    No friction rub. No gallop.  Pulmonary:     Effort: Pulmonary effort is normal. No respiratory distress.     Breath sounds: Normal breath sounds. No stridor. No wheezing or rales.  Musculoskeletal:     Right lower leg: Edema present.     Left lower leg: Edema present.  Skin:    General: Skin is warm.     Coloration: Skin is not pale.  Neurological:     Mental Status: She is alert. Mental status is at baseline.  Psychiatric:        Mood and Affect: Mood normal.        Behavior: Behavior normal.         Assessment & Plan:   Problem List Items Addressed This Visit       Cardiovascular and Mediastinum   Congestive heart failure    Rx for lasix daily with potassium  supplement and we will monitor renal function She does have cardiology f/up Explained benefit of checking in with HF clinic to help assess fluid status/keep her out of hospital etc      Relevant Medications   furosemide (LASIX) 20 MG tablet   potassium chloride SA (KLOR-CON M) 20 MEQ tablet        Respiratory   Chronic respiratory failure with hypoxia and hypercapnia    Stable - orders signed again for oxygen orders/supplies        Endocrine   Type 2 diabetes mellitus - Primary (Chronic)    Getting pt back on meds Lab Results  Component Value Date   HGBA1C 7.7 (H) 10/13/2022  A1C high uncontrolled Discussed ozempic - which is a great option for pt with high A1c with jardiance and metformin Rx sent in - discussed SE, risk and monitoring No contraindications       Relevant Medications   empagliflozin (JARDIANCE) 10 MG TABS tablet   metFORMIN (GLUCOPHAGE-XR) 500 MG 24 hr tablet   Semaglutide,0.25 or 0.5MG /DOS, 2 MG/3ML SOPN   atorvastatin (LIPITOR) 10 MG tablet     Other   Vitamin D deficiency    Reviewed labs and supplements      Hyperlipidemia    Reviewed labs and supplements      Relevant Medications      atorvastatin (LIPITOR) 10 MG tablet    Orders signed again for supply and oxygen orders Use documentation from this and recent exam    Return for 2 month f/up in office CHF, DM, labs f/up .   Sheliah Mends  Elease Hashimotoapia, PA-C 11/30/22 3:24 PM

## 2022-12-09 NOTE — Assessment & Plan Note (Signed)
Rx for lasix daily with potassium supplement and we will monitor renal function She does have cardiology f/up Explained benefit of checking in with HF clinic to help assess fluid status/keep her out of hospital etc

## 2022-12-09 NOTE — Assessment & Plan Note (Signed)
Reviewed labs and supplements

## 2022-12-09 NOTE — Assessment & Plan Note (Signed)
Reviewed labs and supplements 

## 2022-12-09 NOTE — Assessment & Plan Note (Signed)
Getting pt back on meds Lab Results  Component Value Date   HGBA1C 7.7 (H) 10/13/2022  A1C high uncontrolled Discussed ozempic - which is a great option for pt with high A1c with jardiance and metformin Rx sent in - discussed SE, risk and monitoring No contraindications

## 2022-12-09 NOTE — Assessment & Plan Note (Signed)
Stable - orders signed again for oxygen orders/supplies

## 2022-12-21 ENCOUNTER — Ambulatory Visit: Payer: Medicaid Other | Attending: Cardiology | Admitting: Cardiology

## 2022-12-22 ENCOUNTER — Encounter: Payer: Self-pay | Admitting: Cardiology

## 2023-02-01 ENCOUNTER — Ambulatory Visit: Payer: Medicaid Other | Admitting: Family Medicine

## 2023-04-28 ENCOUNTER — Other Ambulatory Visit: Payer: Self-pay | Admitting: Family Medicine

## 2023-04-28 DIAGNOSIS — E119 Type 2 diabetes mellitus without complications: Secondary | ICD-10-CM

## 2023-05-21 ENCOUNTER — Emergency Department
Admission: EM | Admit: 2023-05-21 | Discharge: 2023-05-22 | Disposition: A | Discharge status: Home / Self Care | Payer: MEDICAID | Attending: Emergency Medicine | Admitting: Emergency Medicine

## 2023-05-21 ENCOUNTER — Other Ambulatory Visit: Payer: Self-pay

## 2023-05-21 ENCOUNTER — Emergency Department: Payer: MEDICAID

## 2023-05-21 DIAGNOSIS — Z7984 Long term (current) use of oral hypoglycemic drugs: Secondary | ICD-10-CM | POA: Diagnosis not present

## 2023-05-21 DIAGNOSIS — I509 Heart failure, unspecified: Secondary | ICD-10-CM | POA: Diagnosis not present

## 2023-05-21 DIAGNOSIS — R252 Cramp and spasm: Secondary | ICD-10-CM

## 2023-05-21 DIAGNOSIS — I110 Hypertensive heart disease with heart failure: Secondary | ICD-10-CM | POA: Insufficient documentation

## 2023-05-21 DIAGNOSIS — E876 Hypokalemia: Secondary | ICD-10-CM | POA: Diagnosis not present

## 2023-05-21 DIAGNOSIS — M7989 Other specified soft tissue disorders: Secondary | ICD-10-CM | POA: Diagnosis present

## 2023-05-21 DIAGNOSIS — E119 Type 2 diabetes mellitus without complications: Secondary | ICD-10-CM | POA: Diagnosis not present

## 2023-05-21 LAB — CBC
HCT: 44.2 % (ref 36.0–46.0)
Hemoglobin: 14.3 g/dL (ref 12.0–15.0)
MCH: 28.5 pg (ref 26.0–34.0)
MCHC: 32.4 g/dL (ref 30.0–36.0)
MCV: 88.2 fL (ref 80.0–100.0)
Platelets: 292 10*3/uL (ref 150–400)
RBC: 5.01 MIL/uL (ref 3.87–5.11)
RDW: 14.6 % (ref 11.5–15.5)
WBC: 14.1 10*3/uL — ABNORMAL HIGH (ref 4.0–10.5)
nRBC: 0 % (ref 0.0–0.2)

## 2023-05-21 LAB — BASIC METABOLIC PANEL
Anion gap: 16 — ABNORMAL HIGH (ref 5–15)
BUN: 19 mg/dL (ref 6–20)
CO2: 24 mmol/L (ref 22–32)
Calcium: 9.5 mg/dL (ref 8.9–10.3)
Chloride: 96 mmol/L — ABNORMAL LOW (ref 98–111)
Creatinine, Ser: 1 mg/dL (ref 0.44–1.00)
GFR, Estimated: >60 mL/min (ref >60)
Glucose, Bld: 163 mg/dL — ABNORMAL HIGH (ref 70–99)
Potassium: 3.4 mmol/L — ABNORMAL LOW (ref 3.5–5.1)
Sodium: 136 mmol/L (ref 135–145)

## 2023-05-21 LAB — BRAIN NATRIURETIC PEPTIDE: B Natriuretic Peptide: 15.1 pg/mL (ref 0.0–100.0)

## 2023-05-21 LAB — TROPONIN I (HIGH SENSITIVITY): Troponin I (High Sensitivity): 4 ng/L (ref <18)

## 2023-05-21 LAB — MAGNESIUM: Magnesium: 1.6 mg/dL — ABNORMAL LOW (ref 1.7–2.4)

## 2023-05-21 MED ORDER — ONDANSETRON HCL 4 MG/2ML IJ SOLN
4.0000 mg | Freq: Once | INTRAMUSCULAR | Status: AC
  Administered 2023-05-22: 4 mg via INTRAVENOUS
  Filled 2023-05-21: qty 2

## 2023-05-21 MED ORDER — MAGNESIUM SULFATE 2 GM/50ML IV SOLN
2.0000 g | Freq: Once | INTRAVENOUS | Status: AC
  Administered 2023-05-22: 2 g via INTRAVENOUS
  Filled 2023-05-21: qty 50

## 2023-05-21 MED ORDER — MORPHINE SULFATE (PF) 4 MG/ML IV SOLN
4.0000 mg | Freq: Once | INTRAVENOUS | Status: AC
  Administered 2023-05-22: 4 mg via INTRAVENOUS
  Filled 2023-05-21: qty 1

## 2023-05-21 MED ORDER — POTASSIUM CHLORIDE CRYS ER 20 MEQ PO TBCR
40.0000 meq | EXTENDED_RELEASE_TABLET | Freq: Once | ORAL | Status: AC
  Administered 2023-05-22: 40 meq via ORAL
  Filled 2023-05-21: qty 2

## 2023-05-21 NOTE — ED Provider Notes (Incomplete)
[facility] Provider Note    Event Date/Time   First MD Initiated Contact with Patient 05/21/23 2326     (approximate)   History   Chest Pain   HPI  Krista Mcgee is a 47 y.o. female history of CHF, hypertension, diabetes, obesity who presents to the emergency department with complaints of diffuse body cramps.  States that she noticed her legs were swollen yesterday, left greater than right which is not uncommon for her and she took 40 mg of her husband's torsemide.  She states she was up all night urinating and then today started having diffuse muscle cramps.  She states she is unable to get comfortable.  Reports the swelling in her legs has improved.  No calf tenderness.  No history of DVT.  No vomiting or diarrhea.   History provided by patient, daughter.    Past Medical History:  Diagnosis Date   Acute on chronic respiratory failure with hypoxia (HCC) 10/19/2021   Acute respiratory failure with hypoxia (HCC) 10/20/2021   Anxiety    Depression    Diabetes mellitus without complication (HCC)    Diverticulosis 04/18/2018   Hypertension    Inappropriately high serum insulin 10/25/2018   Viral pneumonia 10/21/2021    Past Surgical History:  Procedure Laterality Date   CHOLECYSTECTOMY     INCONTINENCE SURGERY     OOPHORECTOMY     TUBAL LIGATION      MEDICATIONS:  Prior to Admission medications   Medication Sig Start Date End Date Taking? Authorizing Provider  albuterol (VENTOLIN HFA) 108 (90 Base) MCG/ACT inhaler Inhale 1-2 puffs into the lungs every 6 (six) hours as needed for wheezing or shortness of breath. 11/30/22   Danelle Berry, PA-C  ALPRAZolam (XANAX) 1 MG tablet TAKE 1 TABLET BY MOUTH 3 TIMES A DAY AS NEEDED FOR ANXIETY 06/26/15   [provider]  atorvastatin (LIPITOR) 10 MG tablet Take 1 tablet (10 mg total) by mouth at bedtime. TAKE 1 TABLET BY MOUTH EVERYDAY. 11/30/22   Danelle Berry, PA-C  Budeson-Glycopyrrol-Formoterol  (BREZTRI AEROSPHERE) 160-9-4.8 MCG/ACT AERO Inhale 2 puffs into the lungs in the morning and at bedtime. 11/30/22   Danelle Berry, PA-C  buPROPion (WELLBUTRIN XL) 300 MG 24 hr tablet Take 300 mg by mouth every morning. 05/24/18   [provider]  desvenlafaxine (PRISTIQ) 100 MG 24 hr tablet Take 100 mg by mouth daily. 07/23/22   [provider]  empagliflozin (JARDIANCE) 10 MG TABS tablet Take 1 tablet (10 mg total) by mouth daily before breakfast. 11/30/22   Danelle Berry, PA-C  furosemide (LASIX) 20 MG tablet Take 1 tablet (20 mg total) by mouth in the morning. 11/30/22   Danelle Berry, PA-C  metFORMIN (GLUCOPHAGE-XR) 500 MG 24 hr tablet Take 2 tablets (1,000 mg total) by mouth daily with breakfast. 11/30/22   Danelle Berry, PA-C  potassium chloride SA (KLOR-CON M) 20 MEQ tablet Take 1 tablet (20 mEq total) by mouth daily. 11/30/22   Danelle Berry, PA-C  Vitamin D, Ergocalciferol, (DRISDOL) 1.25 MG (50000 UNIT) CAPS capsule Take 1 capsule (50,000 Units total) by mouth every 7 (seven) days. x12 weeks. 10/15/22   Danelle Berry, PA-C  VRAYLAR 6 MG CAPS Take 1 capsule by mouth daily. 09/26/21   [provider]  zolpidem (AMBIEN CR) 12.5 MG CR tablet Take 12.5 mg by mouth at bedtime. 08/25/21   [provider]    Physical Exam   Triage Vital Signs: ED Triage Vitals  Encounter  Vitals Group     BP 05/21/23 2050 (!) 156/98     Systolic BP Percentile --      Diastolic BP Percentile --      Pulse Rate 05/21/23 2050 (!) 105     Resp 05/21/23 2050 18     Temp 05/21/23 2050 98 F (36.7 C)     Temp Source 05/21/23 2050 Oral     SpO2 05/21/23 2050 95 %     Weight 05/21/23 2048 (!) 385 lb (174.6 kg)     Height 05/21/23 2048 5\' 4"  (1.626 m)     Head Circumference --      Peak Flow --      Pain Score 05/21/23 2048 5     Pain Loc --      Pain Education --      Exclude from Growth Chart --     Most recent vital signs: Vitals:   05/22/23 0041 05/22/23 0100  BP:  (!) 111/58  Pulse:  82 81  Resp: 18 (!) 23  Temp: 97.8 F (36.6 C)   SpO2: 91% 91%    CONSTITUTIONAL: Alert, responds appropriately to questions.  Chronically ill-appearing, obese HEAD: Normocephalic, atraumatic EYES: Conjunctivae clear, pupils appear equal, sclera nonicteric ENT: normal nose; slightly dry mucous membranes NECK: Supple, normal ROM CARD: Aguilera tachycardic; S1 and S2 appreciated RESP: Normal chest excursion without splinting or tachypnea; breath sounds clear and equal bilaterally; no wheezes, no rhonchi, no rales, no hypoxia or respiratory distress, speaking full sentences ABD/GI: Non-distended; soft, non-tender, no rebound, no guarding, no peritoneal signs BACK: The back appears normal EXT: Normal ROM in all joints; no deformity noted, no pitting edema, no calf tenderness or calf swelling SKIN: Normal color for age and race; warm; no rash on exposed skin NEURO: Moves all extremities equally, normal speech PSYCH: The patient's mood and manner are appropriate.   ED Results / Procedures / Treatments   LABS: (all labs ordered are listed, but only abnormal results are displayed) Labs Reviewed  BASIC METABOLIC PANEL - Abnormal; Notable for the following components:      Result Value   Potassium 3.4 (*)    Chloride 96 (*)    Glucose, Bld 163 (*)    Anion gap 16 (*)    All other components within normal limits  CBC - Abnormal; Notable for the following components:   WBC 14.1 (*)    All other components within normal limits  MAGNESIUM - Abnormal; Notable for the following components:   Magnesium 1.6 (*)    All other components within normal limits  BRAIN NATRIURETIC PEPTIDE  PROCALCITONIN  CK  TROPONIN I (HIGH SENSITIVITY)     EKG:  EKG Interpretation Date/Time:  Friday May 21 2023 21:00:01 EDT Ventricular Rate:  103 PR Interval:  146 QRS Duration:  98 QT Interval:  374 QTC Calculation: 489 R Axis:   26  Text Interpretation: Sinus tachycardia Incomplete right  bundle branch block Cannot rule out Anterior infarct (cited on or before 09-Nov-2022) Abnormal ECG When compared with ECG of 09-Nov-2022 19:13, No significant change was found Confirmed by UNCONFIRMED, DOCTOR ([id number]), editor Fredric Mare, Tammy [phone]) on 05/21/2023 10:16:54 PM         RADIOLOGY: My personal review and interpretation of imaging: Chest x-ray shows small bilateral pleural effusions but no edema.  I have personally reviewed all radiology reports.   DG Chest 2 View  Result Date: 05/21/2023 CLINICAL DATA:  Chest pain. Unwell feeling. History of congestive heart  failure. EXAM: CHEST - 2 VIEW COMPARISON:  11/09/2022 FINDINGS: Cardiac enlargement. Pulmonary vascularity is normal. Probable small bilateral pleural effusions. There is evidence of infiltration or atelectasis in the left lower lung which may indicate pneumonia in the appropriate clinical setting. No pneumothorax. Mediastinal contours appear intact. Thoracic scoliosis convex towards the right. IMPRESSION: 1. Cardiac enlargement. 2. Small bilateral pleural effusions. 3. Infiltration suggested in the left lower lung possibly pneumonia. Electronically Signed   By: Burman Nieves M.D.   On: 05/21/2023 21:57     PROCEDURES:  Critical Care performed: No   CRITICAL CARE Performed by: Baxter Hire Baruch Lewers   Total critical care time: 0 minutes  Critical care time was exclusive of separately billable procedures and treating other patients.  Critical care was necessary to treat or prevent imminent or life-threatening deterioration.  Critical care was time spent personally by me on the following activities: development of treatment plan with patient and/or surrogate as well as nursing, discussions with consultants, evaluation of patient's response to treatment, examination of patient, obtaining history from patient or surrogate, ordering and performing treatments and interventions, ordering and review of laboratory studies, ordering and  review of radiographic studies, pulse oximetry and re-evaluation of patient's condition.   Marland Kitchen1-3 Lead EKG Interpretation  Performed by: Aadya Kindler, Layla Maw, DO Authorized by: Lajuan Godbee, Layla Maw, DO     Interpretation: abnormal     ECG rate:  105   ECG rate assessment: tachycardic     Rhythm: sinus tachycardia     Ectopy: none     Conduction: normal       IMPRESSION / MDM / ASSESSMENT AND PLAN / ED COURSE  I reviewed the triage vital signs and the nursing notes.    Patient here with muscle cramps after taking 40 mg of torsemide.  The patient is on the cardiac monitor to evaluate for evidence of arrhythmia and/or significant heart rate changes.   DIFFERENTIAL DIAGNOSIS (includes but not limited to):   Electrolyte derangement, dehydration, rhabdomyolysis   Patient's presentation is most consistent with acute presentation with potential threat to life or bodily function.   PLAN: Labs show potassium level of 3.4.  Will give oral replacement.  No EKG changes.  Will check magnesium level and CK.  Troponin negative.  BNP normal.  Does not appear volume overloaded currently.  We discussed IV fluids which she declines.  Will encourage oral fluids.  Chest x-ray reviewed and interpreted by myself and the radiologist and shows a possible left lower lobe pneumonia although she denies any symptoms consistent with pneumonia.  Will check procalcitonin level to evaluate for possible infection.  Will give pain medication here.   MEDICATIONS GIVEN IN ED: Medications  ketorolac (TORADOL) 30 MG/ML injection 60 mg (has no administration in time range)  methocarbamol (ROBAXIN) tablet 1,000 mg (has no administration in time range)  magnesium sulfate IVPB 2 g 50 mL (0 g Intravenous Stopped 05/22/23 0104)  morphine (PF) 4 MG/ML injection 4 mg (4 mg Intravenous Given 05/22/23 0009)  ondansetron (ZOFRAN) injection 4 mg (4 mg Intravenous Given 05/22/23 0008)  potassium chloride SA (KLOR-CON M) CR tablet 40 mEq (40  mEq Oral Given 05/22/23 0006)  morphine (PF) 4 MG/ML injection 4 mg (4 mg Intravenous Given 05/22/23 1308)     ED COURSE: Patient reports feeling better.  Tolerating p.o.  Magnesium level also low at 1.6.  Getting IV replacement.  No EKG changes.  Denies any symptoms of pneumonia.  No chest pain, shortness of breath, fevers,  cough.  Negative procalcitonin.  Very low suspicion clinically that she has pneumonia.  Discussed these findings with patient and discussed if she develops symptoms of pneumonia to follow-up with her doctor to be started on antibiotic therapy.  She verbalized understanding.  Have advised her not to take her husband's torsemide again in the future.  Recommended over-the-counter electrolyte replacement drinks for the next several days, Tylenol and Motrin as needed.   2:44 AM  Upon discharge, patient began having cramping in her bilateral lower extremities with standing and walking to the wheelchair.  She is tearful and in discomfort.  Compartments are still soft and extremities warm and well-perfused.  No calf tenderness.  We did discuss obtaining venous Dopplers earlier but she declines stating that they have done those on her previously.  Will give Toradol, Robaxin and reassess.  3:04 AM  Pt's teenage daughters stopped a nurse expressing that they were upset that "nothing was being done for their mother".  They requested an ultrasound of the patient's left leg.  Ultrasound had already been offered for the patient but she declined stating that she had never had a DVT before and that ultrasound was often nondiagnostic due to body habitus.  She states that she has had swelling in this leg before and is not concerned for DVT.  When I went back to talk to the family along with charge nurse and the [facility], patient had already eloped before receiving any further pain medication.  Nursing staff states they witnessed patient walking out of the emergency department on her own accord without any  gait abnormalities.  I attempted to contact patient on the home phone number listed in her chart with no answer.  CONSULTS:  none   OUTSIDE RECORDS REVIEWED: Reviewed last PCP note on 11/30/2022.       FINAL CLINICAL IMPRESSION(S) / ED DIAGNOSES   Final diagnoses:  Hypokalemia  Hypomagnesemia  Muscle cramps     Rx / DC Orders   ED Discharge Orders          Ordered    ondansetron (ZOFRAN-ODT) 4 MG disintegrating tablet  Every 6 hours PRN        05/22/23 0208             Note:  This document was prepared using Dragon voice recognition software and may include unintentional dictation errors.   Trevontae Lindahl, Layla Maw, DO 05/22/23 0208    Kingjames Coury, Layla Maw, DO 05/22/23 [phone]

## 2023-05-21 NOTE — ED Triage Notes (Addendum)
Pt to ed from home via POV for multiple complaints. Pt is caox4, crying in triage. Pt states " I do not feel good".   Pt has HX of CHF. Pt states "I peed all night long, I got really bad cramps. I did work from home today at 630 pm and I took a shower before I came here and I got stuck in the bathtub. I had to call 911 and they got me out".  Pt then states "I have been out of my lasix for a long time. I took some of my husbands and it made me feel this bad. I have really bad cramps".

## 2023-05-22 LAB — PROCALCITONIN: Procalcitonin: <0.1 ng/mL

## 2023-05-22 LAB — CK: Total CK: 104 U/L (ref 38–234)

## 2023-05-22 MED ORDER — MORPHINE SULFATE (PF) 4 MG/ML IV SOLN
4.0000 mg | Freq: Once | INTRAVENOUS | Status: AC
  Administered 2023-05-22: 4 mg via INTRAVENOUS
  Filled 2023-05-22: qty 1

## 2023-05-22 MED ORDER — METHOCARBAMOL 500 MG PO TABS: 1000.0000 mg | ORAL_TABLET | Freq: Once | ORAL | Status: DC

## 2023-05-22 MED ORDER — KETOROLAC TROMETHAMINE 30 MG/ML IJ SOLN: 60.0000 mg | Freq: Once | INTRAMUSCULAR | Status: DC

## 2023-05-22 MED ORDER — ONDANSETRON 4 MG PO TBDP: 4.0000 mg | ORAL_TABLET | Freq: Four times a day (QID) | ORAL | 0 refills | Status: DC | PRN

## 2023-05-22 NOTE — Discharge Instructions (Addendum)
You may alternate Tylenol 1000 mg every 6 hours as needed for pain, fever and Ibuprofen 800 mg every 6-8 hours as needed for pain, fever.  Please take Ibuprofen with food.  Do not take more than 4000 mg of Tylenol (acetaminophen) in a 24 hour period. ° °

## 2023-05-22 NOTE — ED Notes (Signed)
Patient was given discharge information at approximately 0235 when the IV was removed.  Everything was explained in detail to her regarding her conditions, her prescription, and the need to follow-up with her PCP.  During discharge and prior to discharge, the patient's daughter kept interrupting demanding a wheelchair.  The daughter was told to stop talking, and that a wheelchair would be provided, but only after the discharge information was fully explained.  The patient was then asked if she would like a wheelchair, to which she responded "please."  When a wheelchair was brought to the room, the patient began cramping again immediately after getting up from the bed and standing.  This nurse explained that he would go and get the physician.  Upon returning to the room, the patient was told that the physician was in another room, but would be here to speak with her in just a few moments.  When this nurse returned after providing care to another patient, the patient and family had vacated the room.   Prior to discharge, the patient's daughter was continually asking how much longer before they could leave. When medication was provided, she wanted to know when she could leave.  The patient told her daughter that she could leave at any time, but the daughter refused to leave without her mother.  The patient was at no time demanding, rude, or inappropriate.   She did apologize for the behavior of her daughter.

## 2023-05-22 NOTE — ED Notes (Signed)
This RN visualized patient exit ED with stable and ambulatory gait. NAD noted, pink/warm skin intact on ambulation. Patient was tearful on exit but did not voice concerns or speak to this RN when walking past desk. Family exited behind her.

## 2023-05-22 NOTE — ED Notes (Signed)
Patient family member came out to nurses desk with complaints that we are not treating patient appropriately and that patient needs imaging of her leg due to swelling. She complaints that we are only medicating her and not treating/finding out the actual cause. Dr. Elesa Massed and Alvis Lemmings, Charge RN notified.

## 2023-06-10 ENCOUNTER — Encounter: Payer: MEDICAID | Admitting: Physician Assistant

## 2023-07-24 ENCOUNTER — Other Ambulatory Visit: Payer: Self-pay | Admitting: Family Medicine

## 2023-07-24 DIAGNOSIS — I509 Heart failure, unspecified: Secondary | ICD-10-CM

## 2023-11-04 ENCOUNTER — Emergency Department: Payer: MEDICAID

## 2023-11-04 ENCOUNTER — Other Ambulatory Visit: Payer: Self-pay

## 2023-11-04 ENCOUNTER — Emergency Department
Admission: EM | Admit: 2023-11-04 | Discharge: 2023-11-04 | Discharge status: Against Medical Advice | Payer: MEDICAID | Attending: Emergency Medicine | Admitting: Emergency Medicine

## 2023-11-04 DIAGNOSIS — Z5321 Procedure and treatment not carried out due to patient leaving prior to being seen by health care provider: Secondary | ICD-10-CM | POA: Insufficient documentation

## 2023-11-04 DIAGNOSIS — R0789 Other chest pain: Secondary | ICD-10-CM | POA: Diagnosis present

## 2023-11-04 HISTORY — DX: Heart failure, unspecified: I50.9

## 2023-11-04 LAB — CBC
HCT: 41.2 % (ref 36.0–46.0)
Hemoglobin: 13.5 g/dL (ref 12.0–15.0)
MCH: 28.8 pg (ref 26.0–34.0)
MCHC: 32.8 g/dL (ref 30.0–36.0)
MCV: 88 fL (ref 80.0–100.0)
Platelets: 261 10*3/uL (ref 150–400)
RBC: 4.68 MIL/uL (ref 3.87–5.11)
RDW: 14.4 % (ref 11.5–15.5)
WBC: 15.3 10*3/uL — ABNORMAL HIGH (ref 4.0–10.5)
nRBC: 0 % (ref 0.0–0.2)

## 2023-11-04 LAB — TROPONIN I (HIGH SENSITIVITY): Troponin I (High Sensitivity): 4 ng/L (ref <18)

## 2023-11-04 LAB — BASIC METABOLIC PANEL
Anion gap: 12 (ref 5–15)
BUN: 11 mg/dL (ref 6–20)
CO2: 22 mmol/L (ref 22–32)
Calcium: 9 mg/dL (ref 8.9–10.3)
Chloride: 101 mmol/L (ref 98–111)
Creatinine, Ser: 0.76 mg/dL (ref 0.44–1.00)
GFR, Estimated: >60 mL/min (ref >60)
Glucose, Bld: 119 mg/dL — ABNORMAL HIGH (ref 70–99)
Potassium: 3.8 mmol/L (ref 3.5–5.1)
Sodium: 135 mmol/L (ref 135–145)

## 2023-11-04 NOTE — ED Triage Notes (Signed)
 Patient states mid chest pain that started approximately 30 minutes ago while driving.

## 2023-11-05 ENCOUNTER — Telehealth: Payer: Self-pay | Admitting: Emergency Medicine

## 2023-11-05 NOTE — Telephone Encounter (Signed)
 Called patient due to left emergency department before provider exam to inquire about condition and follow up plans. Left message.

## 2024-03-01 NOTE — Progress Notes (Addendum)
 Subjective Patient ID: Krista Mcgee is a 48 y.o. female.    Krista Mcgee is a 48 y.o. female who presents to the clinic complaining of bilateral eye redness, itching, and discharge.  Patient states that initially all symptoms started in the left eye which has been progressively improving.  Patient now having severe symptoms in the right eye and reports that she has had discharge and feels like the eye is goopy.  She is now experiencing pain and photophobia.  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  She has been using OTC eyedrops.  Denies known sick contacts.  No recent URI symptoms.   History provided by:  Patient Language interpreter used: No     Review of Systems  Constitutional:  Negative for chills and fever.  HENT:  Negative for facial swelling.   Eyes:  Positive for photophobia, pain, discharge, redness and itching. Negative for visual disturbance.    Patient History  Allergies: No Known Allergies  Past Medical History:  Diagnosis Date  . Affective bipolar disorder (CMS/HCC)   . Anxiety   . Depression   . Diabetes   . Pure hypercholesterolemia    Past Surgical History:  Procedure Laterality Date  . CHOLECYSTECTOMY    . HYSTERECTOMY     Social History   Socioeconomic History  . Marital status: Widowed    Spouse name: Not on file  . Number of children: Not on file  . Years of education: Not on file  . Highest education level: Not on file  Occupational History  . Not on file  Tobacco Use  . Smoking status: Every Day    Current packs/day: 0.50    Average packs/day: 0.5 packs/day for 28.5 years (14.3 ttl pk-yrs)    Types: Cigarettes    Start date: 36  . Smokeless tobacco: Never  Vaping Use  . Vaping status: Never Used  Substance and Sexual Activity  . Alcohol use: Never  . Drug use: Never  . Sexual activity: Not Currently  Other Topics Concern  . Not on file  Social History  Narrative  . Not on file   History reviewed. No pertinent family history. Current Outpatient Medications on File Prior to Visit  Medication Sig Dispense Refill  . albuterol  108 (90 Base) MCG/ACT inhaler Inhale 1-2 puffs.    . ALPRAZolam  (Xanax ) 1 MG tablet TAKE 1 TABLET BY MOUTH 3 TIMES A DAY AS NEEDED FOR ANXIETY    . atorvastatin  (Lipitor) 10 MG tablet Take 10 mg by mouth.    . budesonide -formoterol  (Symbicort ) 160-4.5 MCG/ACT inhaler TAKE 2 PUFFS BY MOUTH TWICE A DAY    . ergocalciferol  (Vitamin D2) 1.25 MG (50000 UT) capsule Take 50,000 Units by mouth.    . furosemide  (Lasix ) 20 MG tablet Take 20 mg by mouth.    . ondansetron  ODT (Zofran -ODT) 4 MG disintegrating tablet Take 4 mg by mouth.    . venlafaxine XR (Effoxor-XR) 75 MG 24 hr capsule Take 3 capsules by mouth 1 (one) time each day in the morning.    . Vraylar  6 MG capsule Take 1 capsule by mouth 1 (one) time each day.    . zolpidem  (Ambien ) 10 MG tablet     . buPROPion  XL (Wellbutrin  XL) 300 MG 24 hr tablet Take 300 mg by mouth 1 (one) time each day in the morning.     No current facility-administered medications on file prior to visit.  Objective  Vitals:   03/01/24 1828  BP: (!) 145/91  Pulse: 89  Resp: 20  Temp: 37.2 C (99 F)  SpO2: 97%  Weight: (!) 158 kg  Height: 5' 6  PainSc:   6        OBGYN/Pregnancy Status: Hysterectomy 2012     Vision Screening   Right eye Left eye Both eyes  Without correction 20/40 20/20 20/20   With correction       Physical Exam Vitals and nursing note reviewed.  Constitutional:      General: She is not in acute distress.    Appearance: Normal appearance. She is not ill-appearing.  HENT:     Head: Normocephalic.     Mouth/Throat:     Lips: Pink.     Mouth: Mucous membranes are moist.  Eyes:     General: Lids are normal. Vision grossly intact.     Conjunctiva/sclera:     Right eye: Right conjunctiva is injected. Exudate present.     Left eye: Left conjunctiva is  injected.     Pupils: Pupils are equal, round, and reactive to light.  Cardiovascular:     Rate and Rhythm: Normal rate and regular rhythm.  Pulmonary:     Effort: Pulmonary effort is normal.     Comments: Patient speaking in full sentences, no increased work of breathing when ambulating. Skin:    General: Skin is warm and dry.  Neurological:     Mental Status: She is alert.  Psychiatric:        Mood and Affect: Mood normal.        Behavior: Behavior normal. Behavior is cooperative.     No results found for this visit on 03/01/24.     Procedures MDM:     1 Acute, uncomplicated illness or injury     Explanation of Medical Decision Making and variances from expected care:    Exam consistent with bilateral conjunctivitis, right worse than left. Rx Polytrim for treatment.  Patient does not use contact lenses or glasses. Recheck if worsening or not improving.  Recommended ophthalmology follow-up. Advised patient that she should be having regular eye exams due to her type 2 diabetes. Discussed supportive care of symptoms.    Assessment requiring historian other than patient: No     Independent visualization of image, tracing, or test: No     Discussion of management with another provider: No     Risk:: Moderate            Assessment/Plan Diagnoses and all orders for this visit:  Acute bacterial conjunctivitis of both eyes -     trimethoprim -polymyxin b (Polytrim) ophthalmic solution; Administer 1 drop into both eyes every 4 (four) hours for 7 days.     Disposition Status: Home  Patient Instructions  Bacterial conjunctivitis is highly contagious and can be spread by direct contact with the patient and their secretions or with contaminated objects and surfaces. These contaminated surfaces should be cleansed with disinfecting wipes. Infected individuals should not share handkerchiefs, tissues, towels, cosmetics, linens, or eating utensils.  Typically patient should  receive at least 24 hours of topical eye therapy before returning to school, work or sports.   Typically after topical antibiotic treatment, patient should be responding in 1-2 days by showing a decrease in discharge, redness, and irritation. If not responding to current therapy, its is important to seek further care and evaluation preferably an ophthalmologist.    Progress note signed by Gerard Gaskins, PA on 03/01/24  at  7:44 PM

## 2024-03-26 ENCOUNTER — Emergency Department: Payer: MEDICAID

## 2024-03-26 ENCOUNTER — Other Ambulatory Visit: Payer: Self-pay

## 2024-03-26 ENCOUNTER — Emergency Department
Admission: EM | Admit: 2024-03-26 | Discharge: 2024-03-26 | Disposition: A | Discharge status: Home / Self Care | Payer: MEDICAID | Attending: Emergency Medicine | Admitting: Emergency Medicine

## 2024-03-26 DIAGNOSIS — I509 Heart failure, unspecified: Secondary | ICD-10-CM | POA: Insufficient documentation

## 2024-03-26 DIAGNOSIS — N132 Hydronephrosis with renal and ureteral calculous obstruction: Secondary | ICD-10-CM | POA: Diagnosis not present

## 2024-03-26 DIAGNOSIS — I110 Hypertensive heart disease with heart failure: Secondary | ICD-10-CM | POA: Diagnosis not present

## 2024-03-26 DIAGNOSIS — E119 Type 2 diabetes mellitus without complications: Secondary | ICD-10-CM | POA: Insufficient documentation

## 2024-03-26 DIAGNOSIS — R079 Chest pain, unspecified: Secondary | ICD-10-CM | POA: Insufficient documentation

## 2024-03-26 DIAGNOSIS — N201 Calculus of ureter: Secondary | ICD-10-CM

## 2024-03-26 DIAGNOSIS — R109 Unspecified abdominal pain: Secondary | ICD-10-CM | POA: Diagnosis present

## 2024-03-26 LAB — URINALYSIS, W/ REFLEX TO CULTURE (INFECTION SUSPECTED)
Bilirubin Urine: NEGATIVE
Glucose, UA: NEGATIVE mg/dL
Ketones, ur: NEGATIVE mg/dL
Nitrite: NEGATIVE
Protein, ur: NEGATIVE mg/dL
RBC / HPF: >50 RBC/hpf (ref 0–5)
Specific Gravity, Urine: 1.025 (ref 1.005–1.030)
pH: 5 (ref 5.0–8.0)

## 2024-03-26 LAB — BASIC METABOLIC PANEL WITH GFR
Anion gap: 10 (ref 5–15)
BUN: 17 mg/dL (ref 6–20)
CO2: 23 mmol/L (ref 22–32)
Calcium: 9 mg/dL (ref 8.9–10.3)
Chloride: 105 mmol/L (ref 98–111)
Creatinine, Ser: 1.25 mg/dL — ABNORMAL HIGH (ref 0.44–1.00)
GFR, Estimated: 54 mL/min — ABNORMAL LOW (ref >60)
Glucose, Bld: 133 mg/dL — ABNORMAL HIGH (ref 70–99)
Potassium: 4 mmol/L (ref 3.5–5.1)
Sodium: 138 mmol/L (ref 135–145)

## 2024-03-26 LAB — CBC
HCT: 40.5 % (ref 36.0–46.0)
Hemoglobin: 13.2 g/dL (ref 12.0–15.0)
MCH: 29.1 pg (ref 26.0–34.0)
MCHC: 32.6 g/dL (ref 30.0–36.0)
MCV: 89.4 fL (ref 80.0–100.0)
Platelets: 222 K/uL (ref 150–400)
RBC: 4.53 MIL/uL (ref 3.87–5.11)
RDW: 14.3 % (ref 11.5–15.5)
WBC: 12.3 K/uL — ABNORMAL HIGH (ref 4.0–10.5)
nRBC: 0 % (ref 0.0–0.2)

## 2024-03-26 LAB — TROPONIN I (HIGH SENSITIVITY): Troponin I (High Sensitivity): 3 ng/L (ref <18)

## 2024-03-26 LAB — HEPATIC FUNCTION PANEL
ALT: 25 U/L (ref 0–44)
AST: 30 U/L (ref 15–41)
Albumin: 3.7 g/dL (ref 3.5–5.0)
Alkaline Phosphatase: 80 U/L (ref 38–126)
Bilirubin, Direct: 0.3 mg/dL — ABNORMAL HIGH (ref 0.0–0.2)
Indirect Bilirubin: 1.2 mg/dL — ABNORMAL HIGH (ref 0.3–0.9)
Total Bilirubin: 1.5 mg/dL — ABNORMAL HIGH (ref 0.0–1.2)
Total Protein: 7.6 g/dL (ref 6.5–8.1)

## 2024-03-26 LAB — LIPASE, BLOOD: Lipase: 32 U/L (ref 11–51)

## 2024-03-26 LAB — POC URINE PREG, ED: Preg Test, Ur: NEGATIVE

## 2024-03-26 MED ORDER — NAPROXEN 500 MG PO TABS: 500.0000 mg | ORAL_TABLET | Freq: Two times a day (BID) | ORAL | 0 refills | Status: DC

## 2024-03-26 MED ORDER — OXYCODONE-ACETAMINOPHEN 5-325 MG PO TABS
1.0000 | ORAL_TABLET | Freq: Once | ORAL | Status: AC
  Administered 2024-03-26: 1 via ORAL
  Filled 2024-03-26: qty 1

## 2024-03-26 MED ORDER — MORPHINE SULFATE (PF) 4 MG/ML IV SOLN
4.0000 mg | Freq: Once | INTRAVENOUS | Status: AC
  Administered 2024-03-26: 4 mg via INTRAVENOUS
  Filled 2024-03-26: qty 1

## 2024-03-26 MED ORDER — TAMSULOSIN HCL 0.4 MG PO CAPS
0.4000 mg | ORAL_CAPSULE | ORAL | Status: AC
  Administered 2024-03-26: 0.4 mg via ORAL
  Filled 2024-03-26: qty 1

## 2024-03-26 MED ORDER — KETOROLAC TROMETHAMINE 15 MG/ML IJ SOLN
15.0000 mg | Freq: Once | INTRAMUSCULAR | Status: AC
  Administered 2024-03-26: 15 mg via INTRAVENOUS
  Filled 2024-03-26: qty 1

## 2024-03-26 MED ORDER — ONDANSETRON 4 MG PO TBDP: 4.0000 mg | ORAL_TABLET | Freq: Three times a day (TID) | ORAL | 0 refills | Status: DC | PRN

## 2024-03-26 MED ORDER — SODIUM CHLORIDE 0.9 % IV BOLUS
500.0000 mL | Freq: Once | INTRAVENOUS | Status: AC
  Administered 2024-03-26: 500 mL via INTRAVENOUS

## 2024-03-26 MED ORDER — TAMSULOSIN HCL 0.4 MG PO CAPS
0.4000 mg | ORAL_CAPSULE | Freq: Every day | ORAL | 0 refills | Status: AC
Start: 2024-03-26 — End: 2024-04-05

## 2024-03-26 MED ORDER — IOHEXOL 300 MG/ML  SOLN
100.0000 mL | Freq: Once | INTRAMUSCULAR | Status: AC | PRN
  Administered 2024-03-26: 100 mL via INTRAVENOUS

## 2024-03-26 MED ORDER — ONDANSETRON HCL 4 MG/2ML IJ SOLN
4.0000 mg | Freq: Once | INTRAMUSCULAR | Status: AC
  Administered 2024-03-26: 4 mg via INTRAVENOUS
  Filled 2024-03-26: qty 2

## 2024-03-26 MED ORDER — OXYCODONE-ACETAMINOPHEN 5-325 MG PO TABS
1.0000 | ORAL_TABLET | Freq: Four times a day (QID) | ORAL | 0 refills | Status: AC | PRN
Start: 2024-03-26 — End: 2024-03-29

## 2024-03-26 NOTE — ED Notes (Signed)
 This RN called and informed Manuelita PEAK that pt would arrive after xray.

## 2024-03-26 NOTE — ED Triage Notes (Signed)
 Pt comes in via pov with complaints of CP, and flank pain that started Saturday night. Pt states that the pain in her flank is constant, but the pain in her chest comes and goes. Pt complains of pain 7/10. Pt has no complaints of SOB, but complains of weakness. Pt with no signs of acute distress at this time.

## 2024-03-26 NOTE — ED Provider Notes (Addendum)
 [facility] Provider Note    Event Date/Time   First MD Initiated Contact with Patient 03/26/24 1307     (approximate)   History   Chest Pain   HPI  TIAHNA CURE is a 48 y.o. female past medical history significant for CHF, depression, diabetes, hypertension, obesity, who presents to the emergency department with chest pain and flank pain.  Overnight on Friday started having some mild left-sided abdominal pain that woke her up from sleep.  Took Tylenol  and had improvement of her pain.  Pain returned yesterday and has worsened today.  Ongoing pain that has been significant to her left flank that radiates around to her front.  Associated with nausea but no episodes of vomiting.  Does state that she has had a kidney stone in the past but this feels different.  Denies dysuria, urinary urgency or frequency.  Mild chest pain but states that she believes it is her anxiety.  Denies any falls or trauma.  No history of DVT or PE.  No shortness of breath.     Physical Exam   Triage Vital Signs: ED Triage Vitals [03/26/24 1259]  Encounter Vitals Group     BP (!) 154/90     Girls Systolic BP Percentile      Girls Diastolic BP Percentile      Boys Systolic BP Percentile      Boys Diastolic BP Percentile      Pulse Rate 86     Resp 19     Temp (!) 97.5 F (36.4 C)     Temp src      SpO2 95 %     Weight (!) 350 lb (158.8 kg)     Height 5' 4 (1.626 m)     Head Circumference      Peak Flow      Pain Score 7     Pain Loc      Pain Education      Exclude from Growth Chart     Most recent vital signs: Vitals:   03/26/24 1259 03/26/24 1400  BP: (!) 154/90 123/69  Pulse: 86 75  Resp: 19 18  Temp: (!) 97.5 F (36.4 C)   SpO2: 95% 98%    Physical Exam Constitutional:      Appearance: She is well-developed. She is obese.  HENT:     Head: Atraumatic.  Eyes:     Conjunctiva/sclera: Conjunctivae normal.  Cardiovascular:     Rate and Rhythm: Regular  rhythm.     Heart sounds: Normal heart sounds.  Pulmonary:     Effort: No respiratory distress.  Abdominal:     General: There is no distension.     Palpations: Abdomen is soft.     Tenderness: There is abdominal tenderness (Left CVA tenderness).     Comments: No lower abdominal tenderness to palpation, no rebound or guarding  Musculoskeletal:        General: Normal range of motion.     Cervical back: Normal range of motion.  Skin:    General: Skin is warm.     Capillary Refill: Capillary refill takes less than 2 seconds.  Neurological:     General: No focal deficit present.     Mental Status: She is alert. Mental status is at baseline.  Psychiatric:        Mood and Affect: Mood normal.     IMPRESSION / MDM / ASSESSMENT AND PLAN / ED COURSE  I reviewed the  triage vital signs and the nursing notes.  Differential diagnosis including ACS, kidney stone, pyelonephritis, diverticulitis, small bowel obstruction  EKG  I, Clotilda Punter, the attending physician, personally viewed and interpreted this ECG.  EKG showed normal sinus rhythm.  Incomplete right bundle branch block.  No significant ST elevation or depression.  No significant change when compared to prior EKG.  No findings of acute ischemia.  No tachycardic or bradycardic dysrhythmias while on cardiac telemetry.  RADIOLOGY  CT scan abdomen and pelvis  Chest x-ray -no acute findings.  Read as no acute findings.  LABS (all labs ordered are listed, but only abnormal results are displayed) Labs interpreted as -    Labs Reviewed  BASIC METABOLIC PANEL WITH GFR - Abnormal; Notable for the following components:      Result Value   Glucose, Bld 133 (*)    Creatinine, Ser 1.25 (*)    GFR, Estimated 54 (*)    All other components within normal limits  CBC - Abnormal; Notable for the following components:   WBC 12.3 (*)    All other components within normal limits  HEPATIC FUNCTION PANEL - Abnormal; Notable for the  following components:   Total Bilirubin 1.5 (*)    Bilirubin, Direct 0.3 (*)    Indirect Bilirubin 1.2 (*)    All other components within normal limits  LIPASE, BLOOD  URINALYSIS, W/ REFLEX TO CULTURE (INFECTION SUSPECTED)  POC URINE PREG, ED  TROPONIN I (HIGH SENSITIVITY)  TROPONIN I (HIGH SENSITIVITY)     MDM  Given IV fluids, IV ketorolac , antiemetics.  On reevaluation had improvement of her pain significantly but states that she still rating it as a 6/10.  Will give dose of morphine .  States that her nausea has improved.  Lab work with leukocytosis of 12.3.  Creatinine mildly elevated from her baseline at 1.25 from a baseline of 0.8-1.  No significant electrolyte abnormality.  T. bili mildly elevated at 1.5.  Lipase within normal limits.  Chest x-ray with no acute findings.  Care transferred to incoming provider.  CT scan pending and urine pending.     PROCEDURES:  Critical Care performed: No  Procedures  Patient's presentation is most consistent with acute presentation with potential threat to life or bodily function.   MEDICATIONS ORDERED IN ED: Medications  morphine  (PF) 4 MG/ML injection 4 mg (has no administration in time range)  ketorolac  (TORADOL ) 15 MG/ML injection 15 mg (15 mg Intravenous Given 03/26/24 1336)  ondansetron  (ZOFRAN ) injection 4 mg (4 mg Intravenous Given 03/26/24 1336)  sodium chloride  0.9 % bolus 500 mL (500 mLs Intravenous New Bag/Given 03/26/24 1336)    FINAL CLINICAL IMPRESSION(S) / ED DIAGNOSES   Final diagnoses:  Chest pain, unspecified type  Left flank pain     Rx / DC Orders   ED Discharge Orders     None        Note:  This document was prepared using Dragon voice recognition software and may include unintentional dictation errors.   Punter Clotilda, MD 03/26/24 1446    Punter Clotilda, MD 03/26/24 1500

## 2024-03-26 NOTE — ED Notes (Signed)
PT unable to give urine sample at this time.   

## 2024-03-26 NOTE — ED Provider Notes (Signed)
 Procedures     ----------------------------------------- 6:42 PM on 03/26/2024 -----------------------------------------   CT shows 3 x 5 mm kidney stone in the left proximal ureter.  UA not consistent with UTI, no significant renal insufficiency.  Will get additional pain medicine, sent prescriptions for analgesia, Zofran , Flomax .   Viviann Pastor, MD 03/26/24 [phone]

## 2024-03-26 NOTE — ED Notes (Signed)
 Pt taken to xray and they will bring to ED 8 after exam

## 2024-05-04 ENCOUNTER — Ambulatory Visit (INDEPENDENT_AMBULATORY_CARE_PROVIDER_SITE_OTHER): Payer: MEDICAID | Admitting: Family Medicine

## 2024-05-04 VITALS — BP 130/84 | HR 82 | Resp 16 | Ht 64.0 in | Wt 346.0 lb

## 2024-05-04 DIAGNOSIS — M2550 Pain in unspecified joint: Secondary | ICD-10-CM

## 2024-05-04 DIAGNOSIS — M791 Myalgia, unspecified site: Secondary | ICD-10-CM | POA: Diagnosis not present

## 2024-05-04 DIAGNOSIS — Z5181 Encounter for therapeutic drug level monitoring: Secondary | ICD-10-CM

## 2024-05-04 DIAGNOSIS — I509 Heart failure, unspecified: Secondary | ICD-10-CM

## 2024-05-04 DIAGNOSIS — E119 Type 2 diabetes mellitus without complications: Secondary | ICD-10-CM | POA: Diagnosis not present

## 2024-05-04 DIAGNOSIS — E559 Vitamin D deficiency, unspecified: Secondary | ICD-10-CM

## 2024-05-04 DIAGNOSIS — E662 Morbid (severe) obesity with alveolar hypoventilation: Secondary | ICD-10-CM | POA: Diagnosis not present

## 2024-05-04 DIAGNOSIS — Z1231 Encounter for screening mammogram for malignant neoplasm of breast: Secondary | ICD-10-CM

## 2024-05-04 DIAGNOSIS — J9612 Chronic respiratory failure with hypercapnia: Secondary | ICD-10-CM

## 2024-05-04 DIAGNOSIS — E785 Hyperlipidemia, unspecified: Secondary | ICD-10-CM

## 2024-05-04 DIAGNOSIS — G8929 Other chronic pain: Secondary | ICD-10-CM

## 2024-05-04 DIAGNOSIS — J9611 Chronic respiratory failure with hypoxia: Secondary | ICD-10-CM

## 2024-05-04 DIAGNOSIS — F4320 Adjustment disorder, unspecified: Secondary | ICD-10-CM

## 2024-05-04 DIAGNOSIS — F3342 Major depressive disorder, recurrent, in full remission: Secondary | ICD-10-CM

## 2024-05-04 DIAGNOSIS — R5383 Other fatigue: Secondary | ICD-10-CM

## 2024-05-04 DIAGNOSIS — Z0000 Encounter for general adult medical examination without abnormal findings: Secondary | ICD-10-CM

## 2024-05-04 DIAGNOSIS — M5116 Intervertebral disc disorders with radiculopathy, lumbar region: Secondary | ICD-10-CM

## 2024-05-04 MED ORDER — ALBUTEROL SULFATE HFA 108 (90 BASE) MCG/ACT IN AERS: 1.0000 | INHALATION_SPRAY | Freq: Four times a day (QID) | RESPIRATORY_TRACT | 2 refills | Status: DC | PRN

## 2024-05-04 MED ORDER — BREZTRI AEROSPHERE 160-9-4.8 MCG/ACT IN AERO: 2.0000 | INHALATION_SPRAY | Freq: Two times a day (BID) | RESPIRATORY_TRACT | 11 refills | Status: DC

## 2024-05-04 NOTE — Progress Notes (Signed)
 "   Patient ID: Krista Mcgee, female    DOB: 02/03/1976, 48 y.o.   MRN: [id number]  PCP: Leavy Mole, PA-C  Chief Complaint  Patient presents with   Annual Exam    Subjective:   Krista Mcgee is a 48 y.o. female, presents to clinic with CC of the following: Here for CPE? Or really because she needs her Bipap orders renewed and documented No recent f/up for multiple chronic conditions - new issues/problems  Decided with pt to address the highest priority issues today, get labs for chronic conditions and address what's needed there next and wait on CPE due to current grief and struggles, pt in agreement  HPI   Grief sudden death at home of spouse on New years, pt not doing well, tired, sad Pain- back pain hurts with sitting, knee pain with standing She asks what meds may help with her chronic back and leg pain she is maxed out on tylenol  and NSAIDs and doesn't want anything sedating   DM - not on metformin  or jardiance  right now LE edema and CHF lasix  prn she is out Not on statin but she is supposed to be  Mood/psych managed by specialists    Lab Results  Component Value Date   HGBA1C 7.7 (H) 10/13/2022   Respiratory failure, requiring BiPAP needs recert/orders  Discussed the use of AI scribe software for clinical note transcription with the patient, who gave verbal consent to proceed.  History of Present Illness Krista Mcgee is a 48 year old female who presents for reauthorization of her stent machine and discussion of her mental health.  Sleep disturbance - Requires stent machine for sleep; reauthorization needed - BiPAP machine is essential for adequate rest and oxygenation with chronic respiratory failure and OHS  Grief and emotional distress - Significant grief and exhaustion following the deaths of her husband on New Year's Eve and her daughter right before Christmas - Persistent sadness and emotional distress eight months after the events - Struggles to  function, often unable to get out of bed - Lacks energy for self-care and daily activities  Mood and psychiatric symptoms - History of depression, anxiety, and bipolar disorder - Currently on medication for mood disorders - Persistent symptoms of depression and anxiety, worsened since recent bereavements - Overwhelmed by daily responsibilities and emotional burden  Functional impairment - Works from home from 9 AM to 5 PM - Responsible for caring for her granddaughter, including pickup, dinner, and bathing - Finds daily routine exhausting and overwhelming - Difficulty maintaining basic self-care tasks     Patient Active Problem List   Diagnosis Date Noted   Vitamin B12 deficiency 10/13/2022   Chronic prescription benzodiazepine use 10/13/2022   Obesity hypoventilation syndrome (HCC) 10/23/2021   Chronic respiratory failure with hypoxia and hypercapnia (HCC) 10/22/2021   Congestive heart failure (HCC) 10/20/2021   Class 3 severe obesity with serious comorbidity and body mass index (BMI) of 60.0 to 69.9 in adult 10/19/2021   Tobacco use 10/19/2021   Hepatic steatosis 04/18/2018   Lumbar disc disease with radiculopathy 03/03/2018   Knee pain, bilateral 03/03/2018   Hip pain, chronic, right 03/03/2018   Hyperlipidemia 03/29/2017   Type 2 diabetes mellitus (HCC) 03/19/2017   Hirsutism 03/19/2017   Vitamin D  deficiency 03/10/2017   DOE (dyspnea on exertion) 03/09/2017   Insomnia 07/29/2015   Major depressive disorder, recurrent, in full remission with anxious distress (HCC) 07/29/2015      Current Outpatient Medications:  albuterol  (VENTOLIN  HFA) 108 (90 Base) MCG/ACT inhaler, Inhale 1-2 puffs into the lungs every 6 (six) hours as needed for wheezing or shortness of breath., Disp: 1 each, Rfl: 2   ALPRAZolam  (XANAX ) 1 MG tablet, TAKE 1 TABLET BY MOUTH 3 TIMES A DAY AS NEEDED FOR ANXIETY, Disp: , Rfl: 2   atorvastatin  (LIPITOR) 10 MG tablet, Take 1 tablet (10 mg total) by  mouth at bedtime. TAKE 1 TABLET BY MOUTH EVERYDAY., Disp: 90 tablet, Rfl: 3   Budeson-Glycopyrrol-Formoterol  (BREZTRI  AEROSPHERE) 160-9-4.8 MCG/ACT AERO, Inhale 2 puffs into the lungs in the morning and at bedtime., Disp: 3 each, Rfl: 1   buPROPion  (WELLBUTRIN  XL) 300 MG 24 hr tablet, Take 300 mg by mouth every morning., Disp: , Rfl: 2   desvenlafaxine (PRISTIQ) 100 MG 24 hr tablet, Take 100 mg by mouth daily., Disp: , Rfl:    empagliflozin  (JARDIANCE ) 10 MG TABS tablet, Take 1 tablet (10 mg total) by mouth daily before breakfast., Disp: 90 tablet, Rfl: 1   furosemide  (LASIX ) 20 MG tablet, Take 1 tablet (20 mg total) by mouth in the morning., Disp: 90 tablet, Rfl: 1   metFORMIN  (GLUCOPHAGE -XR) 500 MG 24 hr tablet, Take 2 tablets (1,000 mg total) by mouth daily with breakfast., Disp: 180 tablet, Rfl: 1   naproxen  (NAPROSYN ) 500 MG tablet, Take 1 tablet (500 mg total) by mouth 2 (two) times daily with a meal., Disp: 20 tablet, Rfl: 0   ondansetron  (ZOFRAN -ODT) 4 MG disintegrating tablet, Take 1 tablet (4 mg total) by mouth every 8 (eight) hours as needed for nausea or vomiting., Disp: 20 tablet, Rfl: 0   potassium chloride  SA (KLOR-CON  M) 20 MEQ tablet, Take 1 tablet (20 mEq total) by mouth daily., Disp: 90 tablet, Rfl: 1   Vitamin D , Ergocalciferol , (DRISDOL ) 1.25 MG (50000 UNIT) CAPS capsule, Take 1 capsule (50,000 Units total) by mouth every 7 (seven) days. x12 weeks., Disp: 12 capsule, Rfl: 0   VRAYLAR  6 MG CAPS, Take 1 capsule by mouth daily., Disp: , Rfl:    zolpidem  (AMBIEN ) 10 MG tablet, Take 10 mg by mouth at bedtime as needed., Disp: , Rfl:    No Known Allergies   Social History   Tobacco Use   Smoking status: Former    Current packs/day: 0.00    Average packs/day: 1 pack/day for 28.0 years (28.0 ttl pk-yrs)    Types: Cigarettes    Start date: 08/12/1994    Quit date: 08/12/2022    Years since quitting: 1.7   Smokeless tobacco: Former   Tobacco comments:    Vaping with nicotine   1 year with light cirgarette smoking, prior to that 1 ppd cig smoking started smoking 48 years old so roughly 1 ppd 27-28 years   Vaping Use   Vaping status: Never Used  Substance Use Topics   Alcohol use: Yes    Comment: occasionally   Drug use: No      Chart Review Today: I personally reviewed active problem list, medication list, allergies, family history, social history, health maintenance, notes from last encounter, lab results, imaging with the patient/caregiver today.   Review of Systems  Constitutional: Negative.   HENT: Negative.    Eyes: Negative.   Respiratory: Negative.    Cardiovascular: Negative.   Gastrointestinal: Negative.   Endocrine: Negative.   Genitourinary: Negative.   Musculoskeletal: Negative.   Skin: Negative.   Allergic/Immunologic: Negative.   Neurological: Negative.   Hematological: Negative.   Psychiatric/Behavioral: Negative.    All  other systems reviewed and are negative.      Objective:   Vitals:   05/04/24 1025  BP: 130/84  Pulse: 82  Resp: 16  SpO2: 96%  Weight: (!) 346 lb (156.9 kg)  Height: 5' 4 (1.626 m)    Body mass index is 59.39 kg/m.  Physical Exam Vitals and nursing note reviewed.  Constitutional:      General: She is not in acute distress.    Appearance: Normal appearance. She is well-developed. She is morbidly obese. She is not ill-appearing, toxic-appearing or diaphoretic.  HENT:     Head: Normocephalic and atraumatic.     Right Ear: External ear normal.     Left Ear: External ear normal.     Nose: Nose normal.  Eyes:     General: No scleral icterus.       Right eye: No discharge.        Left eye: No discharge.     Conjunctiva/sclera: Conjunctivae normal.  Neck:     Trachea: No tracheal deviation.  Cardiovascular:     Rate and Rhythm: Normal rate.  Pulmonary:     Effort: Pulmonary effort is normal. No tachypnea, accessory muscle usage or respiratory distress.     Breath sounds: No stridor. No decreased  breath sounds, wheezing, rhonchi or rales.  Skin:    General: Skin is warm and dry.     Findings: No rash.  Neurological:     Mental Status: She is alert.     Motor: No abnormal muscle tone.     Coordination: Coordination normal.     Gait: Gait normal.  Psychiatric:        Attention and Perception: Attention normal.        Mood and Affect: Mood is depressed. Mood is not anxious. Affect is tearful.        Speech: Speech normal.        Behavior: Behavior normal. Behavior is cooperative.        Thought Content: Thought content normal.      Results for orders placed or performed during the hospital encounter of 03/26/24  Basic metabolic panel   Collection Time: 03/26/24  1:01 PM  Result Value Ref Range   Sodium 138 135 - 145 mmol/L   Potassium 4.0 3.5 - 5.1 mmol/L   Chloride 105 98 - 111 mmol/L   CO2 23 22 - 32 mmol/L   Glucose, Bld 133 (H) 70 - 99 mg/dL   BUN 17 6 - 20 mg/dL   Creatinine, Ser 8.74 (H) 0.44 - 1.00 mg/dL   Calcium  9.0 8.9 - 10.3 mg/dL   GFR, Estimated 54 (L) >60 mL/min   Anion gap 10 5 - 15  CBC   Collection Time: 03/26/24  1:01 PM  Result Value Ref Range   WBC 12.3 (H) 4.0 - 10.5 K/uL   RBC 4.53 3.87 - 5.11 MIL/uL   Hemoglobin 13.2 12.0 - 15.0 g/dL   HCT 59.4 63.9 - 53.9 %   MCV 89.4 80.0 - 100.0 fL   MCH 29.1 26.0 - 34.0 pg   MCHC 32.6 30.0 - 36.0 g/dL   RDW 85.6 88.4 - 84.4 %   Platelets 222 150 - 400 K/uL   nRBC 0.0 0.0 - 0.2 %  Lipase, blood   Collection Time: 03/26/24  1:01 PM  Result Value Ref Range   Lipase 32 11 - 51 U/L  Hepatic function panel   Collection Time: 03/26/24  1:01 PM  Result Value Ref  Range   Total Protein 7.6 6.5 - 8.1 g/dL   Albumin 3.7 3.5 - 5.0 g/dL   AST 30 15 - 41 U/L   ALT 25 0 - 44 U/L   Alkaline Phosphatase 80 38 - 126 U/L   Total Bilirubin 1.5 (H) 0.0 - 1.2 mg/dL   Bilirubin, Direct 0.3 (H) 0.0 - 0.2 mg/dL   Indirect Bilirubin 1.2 (H) 0.3 - 0.9 mg/dL  Troponin I (High Sensitivity)   Collection Time: 03/26/24   1:01 PM  Result Value Ref Range   Troponin I (High Sensitivity) 3 <18 ng/L  Urinalysis, w/ Reflex to Culture (Infection Suspected) -Urine, Clean Catch   Collection Time: 03/26/24  3:29 PM  Result Value Ref Range   Specimen Source URINE, CLEAN CATCH    Color, Urine YELLOW (A) YELLOW   APPearance HAZY (A) CLEAR   Specific Gravity, Urine 1.025 1.005 - 1.030   pH 5.0 5.0 - 8.0   Glucose, UA NEGATIVE NEGATIVE mg/dL   Hgb urine dipstick LARGE (A) NEGATIVE   Bilirubin Urine NEGATIVE NEGATIVE   Ketones, ur NEGATIVE NEGATIVE mg/dL   Protein, ur NEGATIVE NEGATIVE mg/dL   Nitrite NEGATIVE NEGATIVE   Leukocytes,Ua SMALL (A) NEGATIVE   RBC / HPF >50 0 - 5 RBC/hpf   WBC, UA 21-50 0 - 5 WBC/hpf   Bacteria, UA RARE (A) NONE SEEN   Squamous Epithelial / HPF 6-10 0 - 5 /HPF   Mucus PRESENT   POC urine preg, ED   Collection Time: 03/26/24  3:33 PM  Result Value Ref Range   Preg Test, Ur NEGATIVE NEGATIVE       Assessment & Plan:   Problem List Items Addressed This Visit       Cardiovascular and Mediastinum   Congestive heart failure (HCC)   Rx for lasix  daily with potassium supplement and we will monitor renal function She does have cardiology f/up Needs refills after labs are checked      Relevant Orders   Comprehensive Metabolic Panel (CMET) (Completed)     Respiratory   Chronic respiratory failure with hypoxia and hypercapnia (HCC)   Stable - medically necessary - needs orders resigned per pt Signed orders for supplies at home She is using 2L O2 for when ambulating Reviewed her dx and need for pulm f/up and continued care   New start on 02 2lpm 24/7 and bipap hs as of d/c 10/23/21  - referred to sleep medicine 11/20/2021 >>>   I do not know when she first needed chronic O2 After 09/2021 admission it looks like she did f/up with cardiology for new dx of CHF and pulm but has not seen them regularly since She did f/up with Dr. Madelon in office   HOSPITAL FOLLOW  UP Hospital/facility: [facility] 2/26-3/2 Diagnosis: AoC resp failure with hypoxia 2/2 viral PNA, OHS Procedures/tests:  - ECHO - technically challenging. Wall motion and diastolic function assessment indeterminate. LVEF 55-60% with normal systolic function. - RVP +metapneumovirus Consultants: Pulm New medications:  - supplemental O2, BiPAP Discharge instructions:   Status: better - has f/u with Pulm for sleep study, 3/30 - saw HF clinic 3/9, started on lasix  40mg  and jardiance . Leg swelling and breathing improved.  - still SOB with exertion - reports compliance with symbicort . Using albuterol  with prolonged distances. - walked from waiting room to scale and got out of breath. Not new, ongoing for about a year.  - reports better sleep with BiPAP - expresses interest in losing weight, interested in  bariatric surgery.  - exercise difficult due to chronic knee and back pain and breathing difficulties.   After that she did not f/up with cardiology over the past year I see one pulmonology f/up with Dr. Darlean 11/20/2021 They note the following: Dyspnea:  onset 2018  Baseline prev on no 02 / 50 ft hc into store than ride scooter for shopping x years  DOE (dyspnea on exertion) PFTs 03/08/18: FVC: 2.71 L (74 %pred), FEV1: 2.20 L (72 %pred), FEV1/FVC: 81%, TLC: 3.73 L (66 %pred), DLCO 53 %pred, DLCO/VA 141% predicted  @ wt 355    Pattern is c/w OHS in smoker with possible AB in active smoker  rx with symbicort  160 for now though not sure it's needed   - The proper method of use, as well as anticipated side effects, of a metered-dose inhaler were discussed and demonstrated to the patient using teach back method. Improved effectiveness after extensive coaching during this visit to a level of approximately 75% from a baseline of 25 % > continue hfa for now/ sample of breztr x one given just to teach technique    Will sign orders and forms when received - I do not know if we need to initiate new order?   She has supplies at home Still medically necessary      Relevant Medications   budesonide -glycopyrrolate -formoterol  (BREZTRI  AEROSPHERE) 160-9-4.8 MCG/ACT AERO inhaler   albuterol  (VENTOLIN  HFA) 108 (90 Base) MCG/ACT inhaler     Endocrine   Type 2 diabetes mellitus (HCC) - Primary (Chronic)   Off all meds right now since the death of her husband - difficulty caring for herself Recheck labs and reassess based on results      Relevant Orders   HgB A1c (Completed)   Comprehensive Metabolic Panel (CMET) (Completed)   Urine Microalbumin w/creat. ratio (Completed)   HM Diabetes Foot Exam (Completed)     Nervous and Auditory   Lumbar disc disease with radiculopathy   Pain with sitting, maxed out on OTC meds tylenol  and NSAIDs, can also try lyrica  and have muscle relaxers available prn PT or additional assessment recommended and may be needed when pt is agreable PM&R may also be a good option      Relevant Medications   zolpidem  (AMBIEN ) 10 MG tablet     Other   Major depressive disorder, recurrent, in full remission with anxious distress (HCC)   Per psychiatry, worse mood and depression with grief/loss and trauma Pt encouraged to work closely with psychiatry, consider asking for wellbutrin  dose increase    05/04/2024   10:27 AM 11/30/2022    3:03 PM 10/13/2022   10:21 AM  Depression screen PHQ 2/9  Decreased Interest 3 1 0  Down, Depressed, Hopeless 3 1 0  PHQ - 2 Score 6 2 0  Altered sleeping 3 3 0  Tired, decreased energy 3 3 0  Change in appetite 3 0 0  Feeling bad or failure about yourself  0 1 0  Trouble concentrating 3 3 0  Moving slowly or fidgety/restless 0 0 0  Suicidal thoughts 0 0 0  PHQ-9 Score 18 12 0  Difficult doing work/chores  Very difficult Not difficult at all         Vitamin D  deficiency   Recheck labs      Relevant Orders   Vitamin D  (25 hydroxy)   VITAMIN D  25 Hydroxy (Vit-D Deficiency, Fractures) (Completed)   Hyperlipidemia   Not currently  taking statin Recheck labs  Relevant Orders   Lipid Profile (Completed)   Obesity hypoventilation syndrome (HCC)   Per pulm        Other Visit Diagnoses       Breast cancer screening by mammogram       Relevant Orders   MM 3D SCREENING MAMMOGRAM BILATERAL BREAST     Myalgia       Relevant Orders   CBC with Differential/Platelet (Completed)   Vitamin D  (25 hydroxy)   TSH     Polyarthralgia    multiple joints knees, hips, back   Relevant Orders   CBC with Differential/Platelet (Completed)   Vitamin D  (25 hydroxy)   TSH     Fatigue, unspecified type    may be due to grief/loss/trauma, check TSH r/o anemia recheck vit d or other deficiencies, consider asking for wellbutrin  dose increase   Relevant Orders   CBC with Differential/Platelet (Completed)   TSH   TSH (Completed)     Grief reaction    - pt husband just died, offered support and empathy, grief counseling resources given      Encounter for medication monitoring       Relevant Medications   albuterol  (VENTOLIN  HFA) 108 (90 Base) MCG/ACT inhaler   Other Relevant Orders   HgB A1c (Completed)   Comprehensive Metabolic Panel (CMET) (Completed)   Urine Microalbumin w/creat. ratio (Completed)   Lipid Profile (Completed)   CBC with Differential/Platelet (Completed)   Vitamin D  (25 hydroxy)   TSH   VITAMIN D  25 Hydroxy (Vit-D Deficiency, Fractures) (Completed)   TSH (Completed)     Other chronic pain   - knees and back - see below        pt having a lot of pain, depression/grief asks for something that might help with her pain - discussed some options - she would like to avoid narcotic pain meds or other sedating meds we discussed muscle relaxers which she can try if very tight or having spasms but is sedating and would want to use cautions, also discussed lyrica  trial.   Both meds send in, pt understand either could be sedating to her, caution with other sedating meds    Assessment & Plan Depression with  fatigue and grief-related symptoms She is experiencing significant depression with fatigue and grief-related symptoms following the recent deaths of her husband and daughter. Despite being on medication for depression, anxiety, and bipolar disorder, she continues to experience profound sadness and exhaustion, impacting her ability to care for herself and her granddaughter. The situation has persisted for eight months. - Schedule a follow-up with psychiatry to reassess mental health status and medication efficacy, consider wellbutrin  dose increase, grief counseling may be helpful    Return for 1-2 weeks if needed for results/chronic f/up, reschedule CPE not done.     Michelene Cower, PA-C 05/04/24 10:39 AM  "

## 2024-05-05 ENCOUNTER — Ambulatory Visit: Payer: Self-pay | Admitting: Family Medicine

## 2024-05-05 DIAGNOSIS — E559 Vitamin D deficiency, unspecified: Secondary | ICD-10-CM

## 2024-05-05 DIAGNOSIS — E785 Hyperlipidemia, unspecified: Secondary | ICD-10-CM

## 2024-05-05 DIAGNOSIS — I509 Heart failure, unspecified: Secondary | ICD-10-CM

## 2024-05-05 DIAGNOSIS — E119 Type 2 diabetes mellitus without complications: Secondary | ICD-10-CM

## 2024-05-05 LAB — CBC WITH DIFFERENTIAL/PLATELET
Absolute Lymphocytes: 3431 {cells}/uL (ref 850–3900)
Absolute Monocytes: 445 {cells}/uL (ref 200–950)
Basophils Absolute: 46 {cells}/uL (ref 0–200)
Basophils Relative: 0.4 %
Eosinophils Absolute: 638 {cells}/uL — ABNORMAL HIGH (ref 15–500)
Eosinophils Relative: 5.6 %
HCT: 39.9 % (ref 35.0–45.0)
Hemoglobin: 13.1 g/dL (ref 11.7–15.5)
MCH: 29 pg (ref 27.0–33.0)
MCHC: 32.8 g/dL (ref 32.0–36.0)
MCV: 88.3 fL (ref 80.0–100.0)
MPV: 10 fL (ref 7.5–12.5)
Monocytes Relative: 3.9 %
Neutro Abs: 6840 {cells}/uL (ref 1500–7800)
Neutrophils Relative %: 60 %
Platelets: 185 Thousand/uL (ref 140–400)
RBC: 4.52 Million/uL (ref 3.80–5.10)
RDW: 13.8 % (ref 11.0–15.0)
Total Lymphocyte: 30.1 %
WBC: 11.4 Thousand/uL — ABNORMAL HIGH (ref 3.8–10.8)

## 2024-05-05 LAB — TSH: TSH: 1.37 m[IU]/L

## 2024-05-05 LAB — COMPREHENSIVE METABOLIC PANEL WITH GFR
AG Ratio: 1.2 (calc) (ref 1.0–2.5)
ALT: 20 U/L (ref 6–29)
AST: 23 U/L (ref 10–35)
Albumin: 3.9 g/dL (ref 3.6–5.1)
Alkaline phosphatase (APISO): 85 U/L (ref 31–125)
BUN: 13 mg/dL (ref 7–25)
CO2: 28 mmol/L (ref 20–32)
Calcium: 9.1 mg/dL (ref 8.6–10.2)
Chloride: 103 mmol/L (ref 98–110)
Creat: 0.89 mg/dL (ref 0.50–0.99)
Globulin: 3.3 g/dL (ref 1.9–3.7)
Glucose, Bld: 113 mg/dL — ABNORMAL HIGH (ref 65–99)
Potassium: 4.1 mmol/L (ref 3.5–5.3)
Sodium: 138 mmol/L (ref 135–146)
Total Bilirubin: 0.6 mg/dL (ref 0.2–1.2)
Total Protein: 7.2 g/dL (ref 6.1–8.1)
eGFR: 81 mL/min/1.73m2 (ref >=60)

## 2024-05-05 LAB — HEMOGLOBIN A1C
Hgb A1c MFr Bld: 6.9 % — ABNORMAL HIGH (ref <5.7)
Mean Plasma Glucose: 151 mg/dL
eAG (mmol/L): 8.4 mmol/L

## 2024-05-05 LAB — LIPID PANEL
Cholesterol: 207 mg/dL — ABNORMAL HIGH (ref <200)
HDL: 35 mg/dL — ABNORMAL LOW (ref >=50)
LDL Cholesterol (Calc): 136 mg/dL — ABNORMAL HIGH
Non-HDL Cholesterol (Calc): 172 mg/dL — ABNORMAL HIGH (ref <130)
Total CHOL/HDL Ratio: 5.9 (calc) — ABNORMAL HIGH (ref <5.0)
Triglycerides: 218 mg/dL — ABNORMAL HIGH (ref <150)

## 2024-05-05 LAB — MICROALBUMIN / CREATININE URINE RATIO
Creatinine, Urine: 80 mg/dL (ref 20–275)
Microalb Creat Ratio: 6 mg/g{creat} (ref <30)
Microalb, Ur: 0.5 mg/dL

## 2024-05-05 LAB — VITAMIN D 25 HYDROXY (VIT D DEFICIENCY, FRACTURES): Vit D, 25-Hydroxy: 17 ng/mL — ABNORMAL LOW (ref 30–100)

## 2024-05-05 MED ORDER — POTASSIUM CHLORIDE CRYS ER 20 MEQ PO TBCR: 20.0000 meq | EXTENDED_RELEASE_TABLET | Freq: Every day | ORAL | 1 refills | Status: DC

## 2024-05-05 MED ORDER — PREGABALIN 25 MG PO CAPS: 25.0000 mg | ORAL_CAPSULE | Freq: Two times a day (BID) | ORAL | 0 refills | Status: AC

## 2024-05-05 MED ORDER — BACLOFEN 5 MG PO TABS: 5.0000 mg | ORAL_TABLET | Freq: Three times a day (TID) | ORAL | 3 refills | Status: AC | PRN

## 2024-05-05 MED ORDER — ATORVASTATIN CALCIUM 10 MG PO TABS: 10.0000 mg | ORAL_TABLET | Freq: Every day | ORAL | 3 refills | Status: DC

## 2024-05-05 MED ORDER — VITAMIN D (ERGOCALCIFEROL) 1.25 MG (50000 UNIT) PO CAPS: 50000.0000 [IU] | ORAL_CAPSULE | ORAL | 1 refills | Status: AC

## 2024-05-05 MED ORDER — FUROSEMIDE 20 MG PO TABS: 20.0000 mg | ORAL_TABLET | Freq: Every morning | ORAL | 1 refills | Status: AC

## 2024-05-05 NOTE — Assessment & Plan Note (Signed)
 Pain with sitting, maxed out on OTC meds tylenol  and NSAIDs, can also try lyrica  and have muscle relaxers available prn PT or additional assessment recommended and may be needed when pt is agreable PM&R may also be a good option

## 2024-05-05 NOTE — Assessment & Plan Note (Signed)
 Not currently taking statin Recheck labs

## 2024-05-05 NOTE — Assessment & Plan Note (Signed)
 Recheck labs

## 2024-05-05 NOTE — Assessment & Plan Note (Signed)
 Per psychiatry, worse mood and depression with grief/loss and trauma Pt encouraged to work closely with psychiatry, consider asking for wellbutrin  dose increase    05/04/2024   10:27 AM 11/30/2022    3:03 PM 10/13/2022   10:21 AM  Depression screen PHQ 2/9  Decreased Interest 3 1 0  Down, Depressed, Hopeless 3 1 0  PHQ - 2 Score 6 2 0  Altered sleeping 3 3 0  Tired, decreased energy 3 3 0  Change in appetite 3 0 0  Feeling bad or failure about yourself  0 1 0  Trouble concentrating 3 3 0  Moving slowly or fidgety/restless 0 0 0  Suicidal thoughts 0 0 0  PHQ-9 Score 18 12 0  Difficult doing work/chores  Very difficult Not difficult at all

## 2024-05-05 NOTE — Assessment & Plan Note (Signed)
 Stable - medically necessary - needs orders resigned per pt Signed orders for supplies at home She is using 2L O2 for when ambulating Reviewed her dx and need for pulm f/up and continued care   New start on 02 2lpm 24/7 and bipap hs as of d/c 10/23/21  - referred to sleep medicine 11/20/2021 >>>

## 2024-05-05 NOTE — Assessment & Plan Note (Signed)
Per pulm 

## 2024-05-05 NOTE — Assessment & Plan Note (Signed)
 Rx for lasix  daily with potassium supplement and we will monitor renal function She does have cardiology f/up Needs refills after labs are checked

## 2024-05-05 NOTE — Assessment & Plan Note (Signed)
 Off all meds right now since the death of her husband - difficulty caring for herself Recheck labs and reassess based on results

## 2024-05-18 ENCOUNTER — Telehealth: Payer: MEDICAID | Admitting: Family Medicine

## 2024-06-25 ENCOUNTER — Emergency Department: Payer: MEDICAID

## 2024-06-25 ENCOUNTER — Inpatient Hospital Stay
Admission: EM | Admit: 2024-06-25 | Discharge: 2024-06-29 | DRG: 291 | Disposition: A | Discharge status: Home / Self Care | Payer: MEDICAID | Attending: Student | Admitting: Student

## 2024-06-25 ENCOUNTER — Observation Stay: Payer: MEDICAID

## 2024-06-25 DIAGNOSIS — G4733 Obstructive sleep apnea (adult) (pediatric): Secondary | ICD-10-CM | POA: Diagnosis present

## 2024-06-25 DIAGNOSIS — R350 Frequency of micturition: Secondary | ICD-10-CM | POA: Diagnosis present

## 2024-06-25 DIAGNOSIS — Z8249 Family history of ischemic heart disease and other diseases of the circulatory system: Secondary | ICD-10-CM

## 2024-06-25 DIAGNOSIS — J441 Chronic obstructive pulmonary disease with (acute) exacerbation: Secondary | ICD-10-CM | POA: Diagnosis present

## 2024-06-25 DIAGNOSIS — B9789 Other viral agents as the cause of diseases classified elsewhere: Secondary | ICD-10-CM | POA: Diagnosis present

## 2024-06-25 DIAGNOSIS — F419 Anxiety disorder, unspecified: Secondary | ICD-10-CM | POA: Diagnosis present

## 2024-06-25 DIAGNOSIS — J209 Acute bronchitis, unspecified: Secondary | ICD-10-CM | POA: Diagnosis present

## 2024-06-25 DIAGNOSIS — Z716 Tobacco abuse counseling: Secondary | ICD-10-CM

## 2024-06-25 DIAGNOSIS — T380X5A Adverse effect of glucocorticoids and synthetic analogues, initial encounter: Secondary | ICD-10-CM | POA: Diagnosis present

## 2024-06-25 DIAGNOSIS — E66813 Obesity, class 3: Secondary | ICD-10-CM | POA: Diagnosis present

## 2024-06-25 DIAGNOSIS — I5033 Acute on chronic diastolic (congestive) heart failure: Secondary | ICD-10-CM | POA: Diagnosis present

## 2024-06-25 DIAGNOSIS — J440 Chronic obstructive pulmonary disease with acute lower respiratory infection: Secondary | ICD-10-CM | POA: Diagnosis present

## 2024-06-25 DIAGNOSIS — Z7984 Long term (current) use of oral hypoglycemic drugs: Secondary | ICD-10-CM

## 2024-06-25 DIAGNOSIS — Z833 Family history of diabetes mellitus: Secondary | ICD-10-CM

## 2024-06-25 DIAGNOSIS — I890 Lymphedema, not elsewhere classified: Secondary | ICD-10-CM | POA: Diagnosis present

## 2024-06-25 DIAGNOSIS — R0902 Hypoxemia: Principal | ICD-10-CM

## 2024-06-25 DIAGNOSIS — E785 Hyperlipidemia, unspecified: Secondary | ICD-10-CM | POA: Diagnosis present

## 2024-06-25 DIAGNOSIS — E1165 Type 2 diabetes mellitus with hyperglycemia: Secondary | ICD-10-CM | POA: Diagnosis present

## 2024-06-25 DIAGNOSIS — J9601 Acute respiratory failure with hypoxia: Secondary | ICD-10-CM | POA: Diagnosis present

## 2024-06-25 DIAGNOSIS — E119 Type 2 diabetes mellitus without complications: Secondary | ICD-10-CM

## 2024-06-25 DIAGNOSIS — Z83438 Family history of other disorder of lipoprotein metabolism and other lipidemia: Secondary | ICD-10-CM

## 2024-06-25 DIAGNOSIS — E8882 Obesity due to disruption of MC4R pathway: Secondary | ICD-10-CM | POA: Diagnosis present

## 2024-06-25 DIAGNOSIS — J9611 Chronic respiratory failure with hypoxia: Secondary | ICD-10-CM

## 2024-06-25 DIAGNOSIS — Z5181 Encounter for therapeutic drug level monitoring: Secondary | ICD-10-CM

## 2024-06-25 DIAGNOSIS — E559 Vitamin D deficiency, unspecified: Secondary | ICD-10-CM | POA: Diagnosis present

## 2024-06-25 DIAGNOSIS — I110 Hypertensive heart disease with heart failure: Principal | ICD-10-CM | POA: Diagnosis present

## 2024-06-25 DIAGNOSIS — Z825 Family history of asthma and other chronic lower respiratory diseases: Secondary | ICD-10-CM

## 2024-06-25 DIAGNOSIS — Z1152 Encounter for screening for COVID-19: Secondary | ICD-10-CM

## 2024-06-25 DIAGNOSIS — F17210 Nicotine dependence, cigarettes, uncomplicated: Secondary | ICD-10-CM | POA: Diagnosis present

## 2024-06-25 DIAGNOSIS — Z6841 Body Mass Index (BMI) 40.0 and over, adult: Secondary | ICD-10-CM

## 2024-06-25 DIAGNOSIS — T501X5A Adverse effect of loop [high-ceiling] diuretics, initial encounter: Secondary | ICD-10-CM | POA: Diagnosis not present

## 2024-06-25 DIAGNOSIS — Z79899 Other long term (current) drug therapy: Secondary | ICD-10-CM

## 2024-06-25 DIAGNOSIS — F32A Depression, unspecified: Secondary | ICD-10-CM | POA: Diagnosis present

## 2024-06-25 DIAGNOSIS — Z152 Genetic susceptibility to obesity: Secondary | ICD-10-CM

## 2024-06-25 LAB — CBC WITH DIFFERENTIAL/PLATELET
Abs Immature Granulocytes: 0.04 K/uL (ref 0.00–0.07)
Basophils Absolute: 0 K/uL (ref 0.0–0.1)
Basophils Relative: 0 %
Eosinophils Absolute: 0.5 K/uL (ref 0.0–0.5)
Eosinophils Relative: 4 %
HCT: 41.6 % (ref 36.0–46.0)
Hemoglobin: 13.1 g/dL (ref 12.0–15.0)
Immature Granulocytes: 0 %
Lymphocytes Relative: 28 %
Lymphs Abs: 2.9 K/uL (ref 0.7–4.0)
MCH: 29 pg (ref 26.0–34.0)
MCHC: 31.5 g/dL (ref 30.0–36.0)
MCV: 92 fL (ref 80.0–100.0)
Monocytes Absolute: 0.5 K/uL (ref 0.1–1.0)
Monocytes Relative: 4 %
Neutro Abs: 6.5 K/uL (ref 1.7–7.7)
Neutrophils Relative %: 64 %
Platelets: 187 K/uL (ref 150–400)
RBC: 4.52 MIL/uL (ref 3.87–5.11)
RDW: 14.5 % (ref 11.5–15.5)
WBC: 10.3 K/uL (ref 4.0–10.5)
nRBC: 0 % (ref 0.0–0.2)

## 2024-06-25 LAB — RESPIRATORY PANEL BY PCR

## 2024-06-25 LAB — COMPREHENSIVE METABOLIC PANEL WITH GFR
ALT: 30 U/L (ref 0–44)
AST: 37 U/L (ref 15–41)
Albumin: 3.6 g/dL (ref 3.5–5.0)
Alkaline Phosphatase: 77 U/L (ref 38–126)
Anion gap: 12 (ref 5–15)
BUN: 11 mg/dL (ref 6–20)
CO2: 23 mmol/L (ref 22–32)
Calcium: 9 mg/dL (ref 8.9–10.3)
Chloride: 102 mmol/L (ref 98–111)
Creatinine, Ser: 0.84 mg/dL (ref 0.44–1.00)
GFR, Estimated: >60 mL/min (ref >60)
Glucose, Bld: 185 mg/dL — ABNORMAL HIGH (ref 70–99)
Potassium: 3.9 mmol/L (ref 3.5–5.1)
Sodium: 137 mmol/L (ref 135–145)
Total Bilirubin: 1.2 mg/dL (ref 0.0–1.2)
Total Protein: 7.8 g/dL (ref 6.5–8.1)

## 2024-06-25 LAB — RESP PANEL BY RT-PCR (RSV, FLU A&B, COVID)  RVPGX2
Influenza A by PCR: NEGATIVE
Influenza B by PCR: NEGATIVE
Resp Syncytial Virus by PCR: NEGATIVE
SARS Coronavirus 2 by RT PCR: NEGATIVE

## 2024-06-25 LAB — BRAIN NATRIURETIC PEPTIDE: B Natriuretic Peptide: 59.1 pg/mL (ref 0.0–100.0)

## 2024-06-25 LAB — BLOOD GAS, VENOUS
Acid-Base Excess: 2.8 mmol/L — ABNORMAL HIGH (ref 0.0–2.0)
Bicarbonate: 28.5 mmol/L — ABNORMAL HIGH (ref 20.0–28.0)
O2 Saturation: 75.2 %
Patient temperature: 37
pCO2, Ven: 47 mmHg (ref 44–60)
pH, Ven: 7.39 (ref 7.25–7.43)
pO2, Ven: 50 mmHg — ABNORMAL HIGH (ref 32–45)

## 2024-06-25 LAB — TROPONIN I (HIGH SENSITIVITY)
Troponin I (High Sensitivity): 3 ng/L (ref <18)
Troponin I (High Sensitivity): 10 ng/L (ref <18)

## 2024-06-25 MED ORDER — ACETAMINOPHEN 325 MG PO TABS
650.0000 mg | ORAL_TABLET | Freq: Four times a day (QID) | ORAL | Status: DC | PRN
  Administered 2024-06-27: 650 mg via ORAL
  Filled 2024-06-25: qty 2

## 2024-06-25 MED ORDER — PREGABALIN 25 MG PO CAPS: 25.0000 mg | ORAL_CAPSULE | Freq: Two times a day (BID) | ORAL | Status: DC | PRN

## 2024-06-25 MED ORDER — IPRATROPIUM-ALBUTEROL 0.5-2.5 (3) MG/3ML IN SOLN
3.0000 mL | Freq: Four times a day (QID) | RESPIRATORY_TRACT | Status: DC
  Administered 2024-06-25: 3 mL via RESPIRATORY_TRACT
  Filled 2024-06-25: qty 3

## 2024-06-25 MED ORDER — CARIPRAZINE HCL 1.5 MG PO CAPS
6.0000 mg | ORAL_CAPSULE | Freq: Every day | ORAL | Status: DC
  Filled 2024-06-25: qty 4

## 2024-06-25 MED ORDER — BUPROPION HCL ER (XL) 150 MG PO TB24
300.0000 mg | ORAL_TABLET | Freq: Every day | ORAL | Status: DC
  Administered 2024-06-25: 300 mg via ORAL
  Filled 2024-06-25 (×3): qty 2

## 2024-06-25 MED ORDER — DOXYCYCLINE HYCLATE 100 MG PO TABS
100.0000 mg | ORAL_TABLET | Freq: Once | ORAL | Status: AC
  Administered 2024-06-25: 100 mg via ORAL
  Filled 2024-06-25: qty 1

## 2024-06-25 MED ORDER — BACLOFEN 10 MG PO TABS: 5.0000 mg | ORAL_TABLET | Freq: Three times a day (TID) | ORAL | Status: DC | PRN

## 2024-06-25 MED ORDER — CARVEDILOL 6.25 MG PO TABS
3.1250 mg | ORAL_TABLET | Freq: Two times a day (BID) | ORAL | Status: DC
Start: 2024-06-25 — End: 2024-06-25

## 2024-06-25 MED ORDER — DOXYCYCLINE HYCLATE 100 MG PO TABS
100.0000 mg | ORAL_TABLET | Freq: Two times a day (BID) | ORAL | Status: DC
  Administered 2024-06-25 – 2024-06-26 (×2): 100 mg via ORAL
  Filled 2024-06-25 (×2): qty 1

## 2024-06-25 MED ORDER — BUPROPION HCL ER (XL) 150 MG PO TB24
300.0000 mg | ORAL_TABLET | Freq: Every morning | ORAL | Status: DC
  Filled 2024-06-25: qty 2

## 2024-06-25 MED ORDER — SENNOSIDES-DOCUSATE SODIUM 8.6-50 MG PO TABS: 1.0000 | ORAL_TABLET | Freq: Every evening | ORAL | Status: DC | PRN

## 2024-06-25 MED ORDER — ZOLPIDEM TARTRATE 5 MG PO TABS
5.0000 mg | ORAL_TABLET | Freq: Every evening | ORAL | Status: DC | PRN
  Filled 2024-06-25: qty 1

## 2024-06-25 MED ORDER — METHYLPREDNISOLONE SODIUM SUCC 125 MG IJ SOLR: 125.0000 mg | Freq: Once | INTRAMUSCULAR | Status: DC

## 2024-06-25 MED ORDER — BUDESON-GLYCOPYRROL-FORMOTEROL 160-9-4.8 MCG/ACT IN AERO
2.0000 | INHALATION_SPRAY | Freq: Two times a day (BID) | RESPIRATORY_TRACT | Status: DC
  Administered 2024-06-25 – 2024-06-29 (×9): 2 via RESPIRATORY_TRACT
  Filled 2024-06-25: qty 5.9

## 2024-06-25 MED ORDER — PREDNISONE 20 MG PO TABS: 40.0000 mg | ORAL_TABLET | Freq: Every day | ORAL | Status: DC

## 2024-06-25 MED ORDER — ALPRAZOLAM 0.25 MG PO TABS
0.2500 mg | ORAL_TABLET | Freq: Three times a day (TID) | ORAL | Status: DC | PRN
  Administered 2024-06-25 – 2024-06-27 (×5): 0.25 mg via ORAL
  Filled 2024-06-25 (×5): qty 1

## 2024-06-25 MED ORDER — ALBUTEROL SULFATE (2.5 MG/3ML) 0.083% IN NEBU
2.5000 mg | INHALATION_SOLUTION | RESPIRATORY_TRACT | Status: DC | PRN
  Administered 2024-06-25 – 2024-06-28 (×9): 2.5 mg via RESPIRATORY_TRACT
  Filled 2024-06-25 (×9): qty 3

## 2024-06-25 MED ORDER — ONDANSETRON HCL 4 MG/2ML IJ SOLN: 4.0000 mg | Freq: Four times a day (QID) | INTRAMUSCULAR | Status: DC | PRN

## 2024-06-25 MED ORDER — IPRATROPIUM BROMIDE 0.02 % IN SOLN
0.5000 mg | RESPIRATORY_TRACT | Status: DC
  Administered 2024-06-25 – 2024-06-26 (×4): 0.5 mg via RESPIRATORY_TRACT
  Filled 2024-06-25 (×5): qty 2.5

## 2024-06-25 MED ORDER — ACETAMINOPHEN 650 MG RE SUPP: 650.0000 mg | Freq: Four times a day (QID) | RECTAL | Status: DC | PRN

## 2024-06-25 MED ORDER — METFORMIN HCL 500 MG PO TABS
500.0000 mg | ORAL_TABLET | Freq: Two times a day (BID) | ORAL | Status: DC
  Administered 2024-06-25 – 2024-06-29 (×8): 500 mg via ORAL
  Filled 2024-06-25 (×8): qty 1

## 2024-06-25 MED ORDER — BENZONATATE 100 MG PO CAPS
200.0000 mg | ORAL_CAPSULE | Freq: Three times a day (TID) | ORAL | Status: DC | PRN
  Administered 2024-06-27 (×2): 200 mg via ORAL
  Filled 2024-06-25 (×2): qty 2

## 2024-06-25 MED ORDER — METHYLPREDNISOLONE SODIUM SUCC 40 MG IJ SOLR: 40.0000 mg | Freq: Two times a day (BID) | INTRAMUSCULAR | Status: DC

## 2024-06-25 MED ORDER — ALBUTEROL SULFATE (2.5 MG/3ML) 0.083% IN NEBU
3.0000 mL | INHALATION_SOLUTION | Freq: Four times a day (QID) | RESPIRATORY_TRACT | Status: DC | PRN
  Filled 2024-06-25: qty 3

## 2024-06-25 MED ORDER — ENOXAPARIN SODIUM 80 MG/0.8ML IJ SOSY
80.0000 mg | PREFILLED_SYRINGE | INTRAMUSCULAR | Status: DC
  Administered 2024-06-25 – 2024-06-28 (×4): 80 mg via SUBCUTANEOUS
  Filled 2024-06-25 (×5): qty 0.8

## 2024-06-25 MED ORDER — CARVEDILOL 6.25 MG PO TABS
6.2500 mg | ORAL_TABLET | Freq: Two times a day (BID) | ORAL | Status: DC
  Administered 2024-06-25 – 2024-06-28 (×7): 6.25 mg via ORAL
  Filled 2024-06-25 (×8): qty 1

## 2024-06-25 MED ORDER — ATORVASTATIN CALCIUM 20 MG PO TABS: 10.0000 mg | ORAL_TABLET | Freq: Every day | ORAL | Status: DC

## 2024-06-25 MED ORDER — LISINOPRIL 20 MG PO TABS
20.0000 mg | ORAL_TABLET | Freq: Every day | ORAL | Status: DC
  Administered 2024-06-25 – 2024-06-29 (×5): 20 mg via ORAL
  Filled 2024-06-25 (×5): qty 1

## 2024-06-25 MED ORDER — ONDANSETRON HCL 4 MG PO TABS: 4.0000 mg | ORAL_TABLET | Freq: Four times a day (QID) | ORAL | Status: DC | PRN

## 2024-06-25 MED ORDER — NICOTINE 21 MG/24HR TD PT24
21.0000 mg | MEDICATED_PATCH | Freq: Every day | TRANSDERMAL | Status: DC
  Filled 2024-06-25 (×3): qty 1

## 2024-06-25 MED ORDER — VENLAFAXINE HCL ER 150 MG PO CP24: 150.0000 mg | ORAL_CAPSULE | Freq: Every day | ORAL | Status: DC

## 2024-06-25 MED ORDER — HYDRALAZINE HCL 20 MG/ML IJ SOLN
5.0000 mg | Freq: Four times a day (QID) | INTRAMUSCULAR | Status: DC | PRN
  Administered 2024-06-25: 5 mg via INTRAVENOUS
  Filled 2024-06-25: qty 1

## 2024-06-25 MED ORDER — IPRATROPIUM-ALBUTEROL 0.5-2.5 (3) MG/3ML IN SOLN
9.0000 mL | Freq: Once | RESPIRATORY_TRACT | Status: AC
  Administered 2024-06-25: 9 mL via RESPIRATORY_TRACT
  Filled 2024-06-25: qty 9

## 2024-06-25 MED ORDER — METHYLPREDNISOLONE SODIUM SUCC 40 MG IJ SOLR
40.0000 mg | Freq: Two times a day (BID) | INTRAMUSCULAR | Status: AC
  Administered 2024-06-25 – 2024-06-26 (×2): 40 mg via INTRAVENOUS
  Filled 2024-06-25 (×2): qty 1

## 2024-06-25 MED ORDER — CARIPRAZINE HCL 1.5 MG PO CAPS
6.0000 mg | ORAL_CAPSULE | Freq: Every day | ORAL | Status: DC
  Administered 2024-06-25: 6 mg via ORAL
  Filled 2024-06-25 (×4): qty 4

## 2024-06-25 MED ORDER — FUROSEMIDE 10 MG/ML IJ SOLN
40.0000 mg | Freq: Once | INTRAMUSCULAR | Status: AC
  Administered 2024-06-25: 40 mg via INTRAVENOUS
  Filled 2024-06-25: qty 4

## 2024-06-25 MED ORDER — MAGNESIUM SULFATE IN D5W 1-5 GM/100ML-% IV SOLN
1.0000 g | Freq: Once | INTRAVENOUS | Status: AC
  Administered 2024-06-25: 1 g via INTRAVENOUS
  Filled 2024-06-25: qty 100

## 2024-06-25 MED ORDER — PREDNISONE 20 MG PO TABS
40.0000 mg | ORAL_TABLET | Freq: Every day | ORAL | Status: DC
  Administered 2024-06-27 – 2024-06-29 (×3): 40 mg via ORAL
  Filled 2024-06-25 (×3): qty 2

## 2024-06-25 MED ORDER — GUAIFENESIN ER 600 MG PO TB12
1200.0000 mg | ORAL_TABLET | Freq: Two times a day (BID) | ORAL | Status: DC
  Administered 2024-06-25 – 2024-06-27 (×5): 1200 mg via ORAL
  Filled 2024-06-25 (×5): qty 2

## 2024-06-25 NOTE — ED Notes (Signed)
 US  at bedside.

## 2024-06-25 NOTE — ED Notes (Signed)
 Advised nurse that patient has ready bed

## 2024-06-25 NOTE — ED Triage Notes (Signed)
 Pt presents to the ED via [facility] from home. EMS was called out for CP and flu like sx's x3 days.  O2 saturation 93-95% with exertion. Pt has received albueterol and duo-neb. Pt also received 125mg  solu-medrol .   Pt reports generalized chest heaviness.  CBG 166

## 2024-06-25 NOTE — Plan of Care (Signed)

## 2024-06-25 NOTE — H&P (Signed)
 History and Physical    Krista Mcgee FMW:[id number] DOB: 10/09/1975 DOA: 06/25/2024  PCP: Leavy Mole, PA-C (Confirm with patient/family/NH records and if not entered, this has to be entered at [facility] point of entry) Patient coming from: Home  I have personally briefly reviewed patient's old medical records in [facility]  Chief Complaint: Cough, wheezing, shortness of breath  HPI: Krista Mcgee is a 48 y.o. female with medical history significant of COPD, cigarette smoking, morbid obesity, chronic HFpEF, morbid obesity, chronic lymphedema, OSA on HF BiPAP, IIDM, presented with worsening of cough wheezing shortness breath.  Symptoms started 2 days ago, initially patient started develop runny nose congestion and sore throat then she started to feel increasing shortness was dry cough and wheezing, denied any chest pain no fever or chills.  She has tried around-the-clock bronchodilators with no significant improvement.  She also noticed increasing left-sided leg swelling.  ED Course: Afebrile, tachycardia borderline, heart rate 105 blood pressure 119/87 O2 saturation 92% on 2 L.  Chest x-ray showed mild pulmonary congestion, blood work showed BUN 11 creatinine 0.8 WBC 10.3 hemoglobin 13.1 VBG 7.3 9/47/56.  Patient was given IV Solu-Medrol , bronchodilator, doxycycline in the ED.  Review of Systems: As per HPI otherwise 14 point review of systems negative.    Past Medical History:  Diagnosis Date   Acute on chronic respiratory failure with hypoxia (HCC) 10/19/2021   Acute respiratory failure with hypoxia (HCC) 10/20/2021   Anxiety    CHF (congestive heart failure) (HCC)    Depression    Diabetes mellitus without complication (HCC)    Diverticulosis 04/18/2018   Hypertension    Inappropriately high serum insulin  10/25/2018   Oxygen deficiency    Sleep apnea    Viral pneumonia 10/21/2021    Past Surgical History:  Procedure Laterality Date   CHOLECYSTECTOMY     INCONTINENCE  SURGERY     OOPHORECTOMY     TUBAL LIGATION       reports that she quit smoking about 22 months ago. Her smoking use included cigarettes. She started smoking about 29 years ago. She has a 28 pack-year smoking history. She has quit using smokeless tobacco. She reports current alcohol use. She reports that she does not use drugs.  No Known Allergies  Family History  Problem Relation Age of Onset   Cancer Mother        ovarian   Other Father        tumor in his ear drum   Diabetes Father    Heart disease Father    Hyperlipidemia Father    Hypertension Father    Kidney disease Daughter        hydronephrosis   Anxiety disorder Daughter    Arthritis Paternal Grandmother    COPD Paternal Grandmother    Emphysema Paternal Grandmother    Heart disease Paternal Grandmother    Cancer Paternal Grandfather        skin   COPD Paternal Grandfather    Emphysema Paternal Grandfather      Prior to Admission medications   Medication Sig Start Date End Date Taking? Authorizing Provider  albuterol  (VENTOLIN  HFA) 108 (90 Base) MCG/ACT inhaler Inhale 1-2 puffs into the lungs every 6 (six) hours as needed for wheezing or shortness of breath. 05/04/24  Yes Tapia, Leisa, PA-C  ALPRAZolam  (XANAX ) 1 MG tablet TAKE 1 TABLET BY MOUTH 3 TIMES A DAY AS NEEDED FOR ANXIETY 06/26/15  Yes [provider]  Baclofen  5 MG TABS  Take 1 tablet (5 mg total) by mouth 3 (three) times daily as needed (msk pain or spasms). Can be sedating, do not take with other sedating medication 05/05/24  Yes Tapia, Leisa, PA-C  buPROPion  (WELLBUTRIN  XL) 300 MG 24 hr tablet Take 300 mg by mouth every morning. 05/24/18  Yes [provider]  furosemide  (LASIX ) 20 MG tablet Take 1 tablet (20 mg total) by mouth in the morning. 05/05/24  Yes Tapia, Leisa, PA-C  potassium chloride  SA (KLOR-CON  M) 20 MEQ tablet Take 1 tablet (20 mEq total) by mouth daily. 05/05/24  Yes Tapia, Leisa, PA-C  pregabalin  (LYRICA ) 25 MG capsule Take 1  capsule (25 mg total) by mouth 2 (two) times daily. 05/05/24  Yes Tapia, Leisa, PA-C  Vitamin D , Ergocalciferol , (DRISDOL ) 1.25 MG (50000 UNIT) CAPS capsule Take 1 capsule (50,000 Units total) by mouth every 7 (seven) days. x12 weeks. 05/05/24  Yes Leavy Mole, PA-C  VRAYLAR  6 MG CAPS Take 1 capsule by mouth daily. 09/26/21  Yes [provider]  zolpidem  (AMBIEN ) 10 MG tablet Take 10 mg by mouth at bedtime as needed.   Yes [provider]  atorvastatin  (LIPITOR) 10 MG tablet Take 1 tablet (10 mg total) by mouth at bedtime. TAKE 1 TABLET BY MOUTH EVERYDAY. Patient not taking: Reported on 06/25/2024 05/05/24   Tapia, Leisa, PA-C  budesonide -glycopyrrolate-formoterol  (BREZTRI  AEROSPHERE) 160-9-4.8 MCG/ACT AERO inhaler Inhale 2 puffs into the lungs in the morning and at bedtime. Patient not taking: Reported on 06/25/2024 05/04/24   Tapia, Leisa, PA-C  desvenlafaxine (PRISTIQ) 100 MG 24 hr tablet Take 100 mg by mouth daily. Patient not taking: Reported on 06/25/2024 07/23/22   [provider]  naproxen  (NAPROSYN ) 500 MG tablet Take 1 tablet (500 mg total) by mouth 2 (two) times daily with a meal. Patient not taking: Reported on 06/25/2024 03/26/24   Viviann Pastor, MD  ondansetron  (ZOFRAN -ODT) 4 MG disintegrating tablet Take 1 tablet (4 mg total) by mouth every 8 (eight) hours as needed for nausea or vomiting. Patient not taking: Reported on 06/25/2024 03/26/24   Viviann Pastor, MD    Physical Exam: Vitals:   06/25/24 0926 06/25/24 1030 06/25/24 1053 06/25/24 1056  BP:  119/87    Pulse:  (!) 104 (!) 105 (!) 106  Resp:  20    Temp:      TempSrc:      SpO2:  93% 90% 92%  Weight: (!) 163.3 kg       Constitutional: NAD, calm, comfortable Vitals:   06/25/24 0926 06/25/24 1030 06/25/24 1053 06/25/24 1056  BP:  119/87    Pulse:  (!) 104 (!) 105 (!) 106  Resp:  20    Temp:      TempSrc:      SpO2:  93% 90% 92%  Weight: (!) 163.3 kg      Eyes: PERRL, lids and conjunctivae  normal ENMT: Mucous membranes are moist. Posterior pharynx clear of any exudate or lesions.Normal dentition.  Neck: normal, supple, no masses, no thyromegaly Respiratory: Diffused wheezing, decreased breathing sound bilaterally, scattered crackles bilaterally, increasing breathing effort. No accessory muscle use.  Cardiovascular: Regular rate and rhythm, no murmurs / rubs / gallops.  2+ extremity edema. 2+ pedal pulses. No carotid bruits.  Abdomen: no tenderness, no masses palpated. No hepatosplenomegaly. Bowel sounds positive.  Musculoskeletal: no clubbing / cyanosis. No joint deformity upper and lower extremities. Good ROM, no contractures. Normal muscle tone.  Skin: no rashes, lesions, ulcers. No induration Neurologic: CN 2-12  grossly intact. Sensation intact, DTR normal. Strength 5/5 in all 4.  Psychiatric: Normal judgment and insight. Alert and oriented x 3. Normal mood.     Labs on Admission: I have personally reviewed following labs and imaging studies  CBC: Recent Labs  Lab 06/25/24 0939  WBC 10.3  NEUTROABS 6.5  HGB 13.1  HCT 41.6  MCV 92.0  PLT 187   Basic Metabolic Panel: Recent Labs  Lab 06/25/24 0939  NA 137  K 3.9  CL 102  CO2 23  GLUCOSE 185*  BUN 11  CREATININE 0.84  CALCIUM  9.0   GFR: Estimated Creatinine Clearance: 129.6 mL/min (by C-G formula based on SCr of 0.84 mg/dL). Liver Function Tests: Recent Labs  Lab 06/25/24 0939  AST 37  ALT 30  ALKPHOS 77  BILITOT 1.2  PROT 7.8  ALBUMIN 3.6   No results for input(s): LIPASE, AMYLASE in the last 168 hours. No results for input(s): AMMONIA in the last 168 hours. Coagulation Profile: No results for input(s): INR, PROTIME in the last 168 hours. Cardiac Enzymes: No results for input(s): CKTOTAL, CKMB, CKMBINDEX, TROPONINI in the last 168 hours. BNP (last 3 results) No results for input(s): PROBNP in the last 8760 hours. HbA1C: No results for input(s): HGBA1C in the last 72  hours. CBG: No results for input(s): GLUCAP in the last 168 hours. Lipid Profile: No results for input(s): CHOL, HDL, LDLCALC, TRIG, CHOLHDL, LDLDIRECT in the last 72 hours. Thyroid Function Tests: No results for input(s): TSH, T4TOTAL, FREET4, T3FREE, THYROIDAB in the last 72 hours. Anemia Panel: No results for input(s): VITAMINB12, FOLATE, FERRITIN, TIBC, IRON, RETICCTPCT in the last 72 hours. Urine analysis:    Component Value Date/Time   COLORURINE YELLOW (A) 03/26/2024 1529   APPEARANCEUR HAZY (A) 03/26/2024 1529   APPEARANCEUR Turbid 12/01/2012 2149   LABSPEC 1.025 03/26/2024 1529   LABSPEC 1.028 12/01/2012 2149   PHURINE 5.0 03/26/2024 1529   GLUCOSEU NEGATIVE 03/26/2024 1529   GLUCOSEU Negative 12/01/2012 2149   HGBUR LARGE (A) 03/26/2024 1529   BILIRUBINUR NEGATIVE 03/26/2024 1529   BILIRUBINUR Negative 10/17/2018 0907   BILIRUBINUR Negative 12/01/2012 2149   KETONESUR NEGATIVE 03/26/2024 1529   PROTEINUR NEGATIVE 03/26/2024 1529   UROBILINOGEN negative (A) 10/17/2018 0907   NITRITE NEGATIVE 03/26/2024 1529   LEUKOCYTESUR SMALL (A) 03/26/2024 1529   LEUKOCYTESUR 3+ 12/01/2012 2149    Radiological Exams on Admission: DG Chest 2 View Result Date: 06/25/2024 EXAM: 2 VIEW(S) XRAY OF THE CHEST 06/25/2024 10:02:00 AM COMPARISON: 03/26/2024 CLINICAL HISTORY: dyspnea FINDINGS: LUNGS AND PLEURA: No focal pulmonary opacity. No pulmonary edema. No pleural effusion. No pneumothorax. HEART AND MEDIASTINUM: Cardiomegaly. BONES AND SOFT TISSUES: Thoracic dextroscoliosis noted. IMPRESSION: 1. No acute findings. 2. Cardiomegaly. 3. Thoracic dextroscoliosis. Electronically signed by: Evalene Coho MD 06/25/2024 10:05 AM EST RP Workstation: [id number]    EKG: Independently reviewed.  Sinus rhythm, no acute ST changes.  Assessment/Plan Principal Problem:   COPD exacerbation (HCC) Active Problems:   COPD with acute exacerbation (HCC)  (please  populate well all problems here in Problem List. (For example, if patient is on BP meds at home and you resume or decide to hold them, it is a problem that needs to be her. Same for CAD, COPD, HLD and so on)  Acute hypoxic respiratory failure - Likely secondary to combined effect of acute on chronic HFpEF decompensation as well as acute COPD exacerbation - She also has URI symptoms, will check respiratory viral panel  Acute COPD exacerbation -  Continue IV Solu-Medrol  - Continue doxycycline - Continue ICS and LABA - DuoNebs and as needed albuterol  - Incentive sublimation  Acute on chronic HFpEF he was treated - Fluid status is extremely hard to evaluate due to her body habitus - But given the chest x-ray finding and physical exam, decided to give her 1 dose of IV Lasix  - Repeat chest x-ray tomorrow -Echocardiogram - Patient claimed that she does not have history of hypertension, will start her on as needed hydralazine for now.  IIDM - Most recent A1c 6.9 - Start metformin   Morbid obesity - Outpatient GLP-1 agonist therapy evaluation  Cigarette smoking - Cessation education performed at bedside  DVT prophylaxis: Lovenox  Code Status: Full code Family Communication: Daughter at bedside Disposition Plan: Expect less than 2 midnight hospital stay Consults called: None Admission status: Telemetry observation   Cort ONEIDA Mana MD [facility] Pager [phone]  06/25/2024, 12:09 PM

## 2024-06-25 NOTE — ED Provider Notes (Addendum)
 [facility] Provider Note    Event Date/Time   First MD Initiated Contact with Patient 06/25/24 [phone]     (approximate)   History   Chest Pain   HPI  Krista Mcgee is a 48 y.o. female past medical history significant for CHF, diabetes, hypertension, obesity, depression, who presents to the emergency department shortness of breath.  Patient states that she started having flulike symptoms with cough, congestion and shortness of breath over the past couple of days.  Significantly worsened this morning and last night.  States that she had significant shortness of breath and wheezing.  Denies any history of COPD or emphysema.  Does have a prior history of tobacco use.  States that she has been compliant with her home medications.  No significant chest pain or fever.  Does have edema to her lower extremities and feels like her left lower extremity slightly more swollen than normal.  Not on anticoagulation.  Denies any recent falls or trauma.  Received DuoNeb treatment, IV Solu-Medrol  prior to arrival with EMS     Physical Exam   Triage Vital Signs: ED Triage Vitals  Encounter Vitals Group     BP      Girls Systolic BP Percentile      Girls Diastolic BP Percentile      Boys Systolic BP Percentile      Boys Diastolic BP Percentile      Pulse      Resp      Temp      Temp src      SpO2      Weight      Height      Head Circumference      Peak Flow      Pain Score      Pain Loc      Pain Education      Exclude from Growth Chart     Most recent vital signs: Vitals:   06/25/24 0925 06/25/24 1030  BP: (!) 164/92 119/87  Pulse: 100 (!) 104  Resp: 20 20  Temp: 98.9 F (37.2 C)   SpO2: 93% 93%    Physical Exam Constitutional:      Appearance: She is well-developed. She is obese.  HENT:     Head: Atraumatic.  Eyes:     Conjunctiva/sclera: Conjunctivae normal.  Cardiovascular:     Rate and Rhythm: Regular rhythm.     Heart sounds: Normal  heart sounds. No murmur heard. Pulmonary:     Effort: Respiratory distress present.     Breath sounds: Wheezing present.     Comments: Speaking in 2 word sentences.  Assessor muscle use.  Diffuse inspiratory and expiratory wheezing throughout all lung fields. Abdominal:     General: There is no distension.     Palpations: Abdomen is soft.     Tenderness: There is no abdominal tenderness.  Musculoskeletal:        General: Normal range of motion.     Cervical back: Normal range of motion.     Right lower leg: No edema.     Left lower leg: No edema.     Comments: No significant lower extremity edema.  Left lower extremity appears mildly increased in size when compared to the right lower extremity.  +2 DP pulses that are equal bilaterally.  No overlying erythema warmth or induration.  Skin:    General: Skin is warm.     Capillary Refill: Capillary refill takes less than  2 seconds.  Neurological:     General: No focal deficit present.     Mental Status: She is alert. Mental status is at baseline.     IMPRESSION / MDM / ASSESSMENT AND PLAN / ED COURSE  I reviewed the triage vital signs and the nursing notes.  Differential diagnosis including bronchitis, viral illness including COVID/influenza, anemia, pneumonia, pulmonary embolism, heart failure exacerbation, ACS   EKG  I, Clotilda Punter, the attending physician, personally viewed and interpreted this ECG.  EKG showed normal sinus rhythm.  Normal intervals.  No chamber enlargement.  No significant ST elevation or depression.  No significant change when compared to prior EKG.  No signs of acute ischemia or dysrhythmia.  No tachycardic or bradycardic dysrhythmias while on cardiac telemetry.  RADIOLOGY I independently reviewed imaging, my interpretation of imaging: Chest x-ray read as no acute findings with cardiomegaly.  My evaluation questionable infiltrate to the right lower lung.  Right lower extremity DVT scan  ordered.  LABS (all labs ordered are listed, but only abnormal results are displayed) Labs interpreted as -    Labs Reviewed  COMPREHENSIVE METABOLIC PANEL WITH GFR - Abnormal; Notable for the following components:      Result Value   Glucose, Bld 185 (*)    All other components within normal limits  BLOOD GAS, VENOUS - Abnormal; Notable for the following components:   pO2, Ven 50 (*)    Bicarbonate 28.5 (*)    Acid-Base Excess 2.8 (*)    All other components within normal limits  RESP PANEL BY RT-PCR (RSV, FLU A&B, COVID)  [id number]  CBC WITH DIFFERENTIAL/PLATELET  BRAIN NATRIURETIC PEPTIDE  TROPONIN I (HIGH SENSITIVITY)     MDM  Lab work with no leukocytosis.  No significant anemia VBG with no findings concerning for hypercarbia  Given her significant wheezing and increased work of breathing given another round of DuoNeb treatments.  Patient continues to have significant increased work of breathing.  Tachypneic and speaking in 2 word sentences.  Diffuse wheezing on my exam.  Clinical Course as of 06/25/24 1054  Sun Jun 25, 2024  1040 With minimal exertion at bedside patient became hypoxic to 89%, comes up to 90 to 91% at rest.  [facility] hospitalist for acute hypoxic respiratory failure. [SM]    Clinical Course User Index [SM] Punter Clotilda, MD   Discussed possible PE workup with hospitalist and possible occult pneumonia.  Hospitalist will come to evaluate.  Given doxycycline for possible atypical pneumonia.  PROCEDURES:  Critical Care performed: yes  .Critical Care  Performed by: Punter Clotilda, MD Authorized by: Punter Clotilda, MD   Critical care provider statement:    Critical care time (minutes):  30   Critical care time was exclusive of:  Separately billable procedures and treating other patients   Critical care was time spent personally by me on the following activities:  Development of treatment plan with patient or surrogate, discussions with consultants,  evaluation of patient's response to treatment, examination of patient, ordering and review of laboratory studies, ordering and review of radiographic studies, ordering and performing treatments and interventions, pulse oximetry, re-evaluation of patient's condition and review of old charts   Care discussed with: admitting provider     Patient's presentation is most consistent with acute presentation with potential threat to life or bodily function.   MEDICATIONS ORDERED IN ED: Medications  ipratropium-albuterol  (DUONEB) 0.5-2.5 (3) MG/3ML nebulizer solution 9 mL (9 mLs Nebulization Given 06/25/24 1000)  doxycycline (VIBRA-TABS) tablet 100  mg (100 mg Oral Given 06/25/24 1053)    FINAL CLINICAL IMPRESSION(S) / ED DIAGNOSES   Final diagnoses:  Hypoxic  Acute bronchitis, unspecified organism     Rx / DC Orders   ED Discharge Orders     None        Note:  This document was prepared using Dragon voice recognition software and may include unintentional dictation errors.   Suzanne Kirsch, MD 06/25/24 1042    Suzanne Kirsch, MD 06/25/24 1054

## 2024-06-25 NOTE — ED Notes (Signed)
 Pt placed on 2L O2 via [place]. MD notified.

## 2024-06-26 ENCOUNTER — Observation Stay (HOSPITAL_COMMUNITY)
Admit: 2024-06-26 | Discharge: 2024-06-26 | Disposition: A | Discharge status: Home / Self Care | Payer: MEDICAID | Attending: Internal Medicine | Admitting: Internal Medicine

## 2024-06-26 ENCOUNTER — Observation Stay: Payer: MEDICAID

## 2024-06-26 DIAGNOSIS — B9789 Other viral agents as the cause of diseases classified elsewhere: Secondary | ICD-10-CM | POA: Diagnosis present

## 2024-06-26 DIAGNOSIS — I5031 Acute diastolic (congestive) heart failure: Secondary | ICD-10-CM | POA: Diagnosis not present

## 2024-06-26 DIAGNOSIS — T501X5A Adverse effect of loop [high-ceiling] diuretics, initial encounter: Secondary | ICD-10-CM | POA: Diagnosis not present

## 2024-06-26 DIAGNOSIS — G4733 Obstructive sleep apnea (adult) (pediatric): Secondary | ICD-10-CM | POA: Diagnosis present

## 2024-06-26 DIAGNOSIS — F17210 Nicotine dependence, cigarettes, uncomplicated: Secondary | ICD-10-CM | POA: Diagnosis present

## 2024-06-26 DIAGNOSIS — Z8249 Family history of ischemic heart disease and other diseases of the circulatory system: Secondary | ICD-10-CM | POA: Diagnosis not present

## 2024-06-26 DIAGNOSIS — I890 Lymphedema, not elsewhere classified: Secondary | ICD-10-CM | POA: Diagnosis present

## 2024-06-26 DIAGNOSIS — E785 Hyperlipidemia, unspecified: Secondary | ICD-10-CM | POA: Diagnosis present

## 2024-06-26 DIAGNOSIS — F32A Depression, unspecified: Secondary | ICD-10-CM | POA: Diagnosis present

## 2024-06-26 DIAGNOSIS — T380X5A Adverse effect of glucocorticoids and synthetic analogues, initial encounter: Secondary | ICD-10-CM | POA: Diagnosis present

## 2024-06-26 DIAGNOSIS — E1165 Type 2 diabetes mellitus with hyperglycemia: Secondary | ICD-10-CM | POA: Diagnosis present

## 2024-06-26 DIAGNOSIS — Z1152 Encounter for screening for COVID-19: Secondary | ICD-10-CM | POA: Diagnosis not present

## 2024-06-26 DIAGNOSIS — Z6841 Body Mass Index (BMI) 40.0 and over, adult: Secondary | ICD-10-CM | POA: Diagnosis not present

## 2024-06-26 DIAGNOSIS — R0902 Hypoxemia: Secondary | ICD-10-CM | POA: Diagnosis present

## 2024-06-26 DIAGNOSIS — E66813 Obesity, class 3: Secondary | ICD-10-CM | POA: Diagnosis present

## 2024-06-26 DIAGNOSIS — Z833 Family history of diabetes mellitus: Secondary | ICD-10-CM | POA: Diagnosis not present

## 2024-06-26 DIAGNOSIS — J440 Chronic obstructive pulmonary disease with acute lower respiratory infection: Secondary | ICD-10-CM | POA: Diagnosis present

## 2024-06-26 DIAGNOSIS — Z7984 Long term (current) use of oral hypoglycemic drugs: Secondary | ICD-10-CM | POA: Diagnosis not present

## 2024-06-26 DIAGNOSIS — J209 Acute bronchitis, unspecified: Secondary | ICD-10-CM | POA: Diagnosis present

## 2024-06-26 DIAGNOSIS — Z79899 Other long term (current) drug therapy: Secondary | ICD-10-CM | POA: Diagnosis not present

## 2024-06-26 DIAGNOSIS — I5033 Acute on chronic diastolic (congestive) heart failure: Secondary | ICD-10-CM | POA: Diagnosis present

## 2024-06-26 DIAGNOSIS — Z825 Family history of asthma and other chronic lower respiratory diseases: Secondary | ICD-10-CM | POA: Diagnosis not present

## 2024-06-26 DIAGNOSIS — J9601 Acute respiratory failure with hypoxia: Secondary | ICD-10-CM | POA: Diagnosis present

## 2024-06-26 DIAGNOSIS — E8882 Obesity due to disruption of MC4R pathway: Secondary | ICD-10-CM | POA: Diagnosis present

## 2024-06-26 DIAGNOSIS — I110 Hypertensive heart disease with heart failure: Secondary | ICD-10-CM | POA: Diagnosis present

## 2024-06-26 DIAGNOSIS — J441 Chronic obstructive pulmonary disease with (acute) exacerbation: Secondary | ICD-10-CM | POA: Diagnosis present

## 2024-06-26 LAB — HIV ANTIBODY (ROUTINE TESTING W REFLEX): HIV Screen 4th Generation wRfx: NONREACTIVE

## 2024-06-26 LAB — ECHOCARDIOGRAM COMPLETE
AR max vel: 3.78 cm2
AV Area VTI: 3.13 cm2
AV Area mean vel: 3.6 cm2
AV Mean grad: 5 mmHg
AV Peak grad: 9.1 mmHg
Ao pk vel: 1.51 m/s
Area-P 1/2: 4.68 cm2
MV VTI: 3.27 cm2
Single Plane A4C EF: 82.8 %
Weight: 5760 [oz_av]

## 2024-06-26 MED ORDER — FUROSEMIDE 10 MG/ML IJ SOLN
40.0000 mg | Freq: Three times a day (TID) | INTRAMUSCULAR | Status: AC
Start: 2024-06-26 — End: 2024-06-26
  Administered 2024-06-26 (×2): 40 mg via INTRAVENOUS
  Filled 2024-06-26 (×2): qty 4

## 2024-06-26 MED ORDER — AZITHROMYCIN 250 MG PO TABS
250.0000 mg | ORAL_TABLET | Freq: Every day | ORAL | Status: DC
  Administered 2024-06-26 – 2024-06-28 (×3): 250 mg via ORAL
  Filled 2024-06-26 (×3): qty 1

## 2024-06-26 MED ORDER — PERFLUTREN LIPID MICROSPHERE
1.0000 mL | INTRAVENOUS | Status: AC | PRN
  Administered 2024-06-26: 2 mL via INTRAVENOUS

## 2024-06-26 NOTE — Progress Notes (Signed)
 Progress Note   Patient: Krista Mcgee FMW:[id number] DOB: 11/30/1975 DOA: 06/25/2024     0 DOS: the patient was seen and examined on 06/26/2024   Brief hospital course: HPI on admission 11/2: Krista Mcgee is a 48 y.o. female with medical history significant of COPD, cigarette smoking, morbid obesity, chronic HFpEF, morbid obesity, chronic lymphedema, OSA on HF BiPAP, IIDM, presented with worsening of cough wheezing shortness breath.  Symptoms started 2 days ago, initially patient started develop runny nose congestion and sore throat then she started to feel increasing shortness was dry cough and wheezing, denied any chest pain no fever or chills.  She has tried around-the-clock bronchodilators with no significant improvement.  She also noticed increasing left-sided leg swelling.   ED Course: Afebrile, tachycardia borderline, heart rate 105 blood pressure 119/87 O2 saturation 92% on 2 L.  Chest x-ray showed mild pulmonary congestion, blood work showed BUN 11 creatinine 0.8 WBC 10.3 hemoglobin 13.1 VBG 7.3 9/47/56.   Patient was given IV Solu-Medrol , bronchodilator, doxycycline in the ED.   Pt was admitted for further evaluation and management of COPD with acute exacerbation.  Respiratory panel subsequently positive for rhinovirus.  Patient was to be titrated with IV Lasix  for mild pulmonary edema also likely contributing to her respiratory symptoms.     Assessment and Plan:  Acute hypoxic respiratory failure Due to combined effect of acute on chronic HFpEF decompensation as well as acute COPD exacerbation --Supplemental O2 per protocol and wean as tolerated --Management of underlying issues as below -- Check ambulatory O2 sats prior to discharge  Acute COPD exacerbation Rhinovirus infection - Continue IV Solu-Medrol  --Transition to prednisone  when improved - Change doxycycline to Zithromax  for anti-inflammatory properties - Continue ICS and LABA - DuoNebs and as needed  albuterol  - Incentive spirometer   Acute on chronic HFpEF Volume status on exam is difficult due to body habitus.  CXR with some pulmonary edema. Pt given IV Lasix  day of admission. Repeat CXR this AM still showing mild pulmonary edema. --40 mg IV Lasix  x 2 doses today --Follow-up pending echocardiogram --Monitor volume status and follow electrolytes   IIDM - Most recent A1c 6.9 - Started on metformin  - PCP follow up   Morbid obesity - Outpatient GLP-1 agonist therapy evaluation   Cigarette smoking - Cessation education performed at bedside      Subjective: Patient seen awake resting in bed today.  She reports ongoing dyspnea with exertion much worse than at her baseline.  Has mostly dry cough.  Denies fevers chills.  Reports frequent urination due to Lasix  that slows down after about 4 hours or so.  No other acute complaints at this time.  Physical Exam: Vitals:   06/25/24 2128 06/26/24 0304 06/26/24 0629 06/26/24 0839  BP: 133/68 125/66 (!) 151/73 (!) 147/72  Pulse: 99 95 96 100  Resp: 19 (!) 24 (!) 21 19  Temp: 98 F (36.7 C) 97.6 F (36.4 C) 97.8 F (36.6 C) 97.9 F (36.6 C)  TempSrc:   Oral   SpO2: 94% 93% 91% 94%  Weight:       General exam: awake, alert, no acute distress, obese HEENT: atraumatic, clear conjunctiva, anicteric sclera, moist mucus membranes, hearing grossly normal  Respiratory system: diminished breath sounds due to poor air movement, no audible wheezes, no rhonchi, normal respiratory effort. Cardiovascular system: normal S1/S2, RRR  Gastrointestinal system: soft, NT, ND Central nervous system: A&O x 3. no gross focal neurologic deficits, normal speech Extremities: trace+ BLE edema,  normal tone Skin: dry, intact, normal temperature Psychiatry: normal mood, congruent affect, judgement and insight appear normal   Data Reviewed:  Notable labs: No new labs this morning  Yesterday's labs essentially normal except for nonfasting glucose  185  Respiratory viral panel positive for rhinovirus  Chest x-ray this morning showed mild pulmonary edema  Echocardiogram EF 55 to 60%, grade 1 diastolic dysfunction  Family Communication: None present.  Patient updated in detail  Disposition: Status is: Observation Remains admitted on IV steroids still requiring oxygen   Planned Discharge Destination: Home    Time spent: 45 minutes  Author: Burnard DELENA Cunning, DO 06/26/2024 2:19 PM  For on call review [email or web address].

## 2024-06-26 NOTE — Plan of Care (Signed)
   Problem: Education: Goal: Knowledge of General Education information will improve Description: Including pain rating scale, medication(s)/side effects and non-pharmacologic comfort measures Outcome: Progressing   Problem: Clinical Measurements: Goal: Ability to maintain clinical measurements within normal limits will improve Outcome: Progressing Goal: Will remain free from infection Outcome: Progressing Goal: Diagnostic test results will improve Outcome: Progressing Goal: Respiratory complications will improve Outcome: Progressing Goal: Cardiovascular complication will be avoided Outcome: Progressing   Problem: Activity: Goal: Risk for activity intolerance will decrease Outcome: Progressing   Problem: Nutrition: Goal: Adequate nutrition will be maintained Outcome: Progressing   Problem: Elimination: Goal: Will not experience complications related to bowel motility Outcome: Progressing Goal: Will not experience complications related to urinary retention Outcome: Progressing

## 2024-06-26 NOTE — Progress Notes (Addendum)
 TOC Brief Assessment:  Patient admitted from home with Acute Resp Failure/COPD exacerbation, on high flow BIPAP at home. She is being treated with IV solumedrol and duo nebs. Patient has a PCP on record and [employer] is her payor. No TOC identified at this time. Please outreach to [facility] if needs arise.

## 2024-06-27 DIAGNOSIS — E66813 Obesity, class 3: Secondary | ICD-10-CM | POA: Diagnosis not present

## 2024-06-27 DIAGNOSIS — E8882 Obesity due to disruption of MC4R pathway: Secondary | ICD-10-CM | POA: Diagnosis not present

## 2024-06-27 DIAGNOSIS — Z6841 Body Mass Index (BMI) 40.0 and over, adult: Secondary | ICD-10-CM

## 2024-06-27 DIAGNOSIS — Z152 Genetic susceptibility to obesity: Secondary | ICD-10-CM

## 2024-06-27 DIAGNOSIS — I5033 Acute on chronic diastolic (congestive) heart failure: Secondary | ICD-10-CM | POA: Insufficient documentation

## 2024-06-27 DIAGNOSIS — J441 Chronic obstructive pulmonary disease with (acute) exacerbation: Secondary | ICD-10-CM | POA: Diagnosis not present

## 2024-06-27 MED ORDER — GUAIFENESIN 100 MG/5ML PO LIQD
5.0000 mL | ORAL | Status: DC | PRN
  Administered 2024-06-27 – 2024-06-28 (×4): 5 mL via ORAL
  Filled 2024-06-27 (×4): qty 10

## 2024-06-27 NOTE — Progress Notes (Signed)
 Progress Note   Patient: Krista Mcgee FMW:[id number] DOB: 01/12/1976 DOA: 06/25/2024     1 DOS: the patient was seen and examined on 06/27/2024   Brief hospital course: Krista Mcgee is a 48 y.o. female with medical history significant of COPD, cigarette smoking, morbid obesity, chronic HFpEF, morbid obesity, chronic lymphedema, OSA on HF BiPAP, IIDM, presented with worsening of cough wheezing shortness breath.  Symptoms started 2 days ago, initially patient started develop runny nose congestion and sore throat then she started to feel increasing shortness was dry cough and wheezing, denied any chest pain no fever or chills.  She has tried around-the-clock bronchodilators with no significant improvement.  She also noticed increasing left-sided leg swelling.   ED Course: Afebrile, tachycardia borderline, heart rate 105 blood pressure 119/87 O2 saturation 92% on 2 L.  Chest x-ray showed mild pulmonary congestion, blood work showed BUN 11 creatinine 0.8 WBC 10.3 hemoglobin 13.1 VBG 7.3 9/47/56.   Patient was given IV Solu-Medrol , bronchodilator, doxycycline in the ED.     Pt was admitted for further evaluation and management of COPD with acute exacerbation.   Respiratory panel subsequently positive for rhinovirus.   Patient was to be titrated with IV Lasix  for mild pulmonary edema also likely contributing to her respiratory symptoms.     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  Will need home oxygen evaluation before discharge.   Acute COPD exacerbation Rhinovirus infection Due to significant cough and shortness of breath.  Received Solu-Medrol , now 4-day of prednisone  dose.   Acute on chronic HFpEF Volume status on exam is difficult due to body habitus.  CXR with some pulmonary edema. Her cardiogram showed ejection fraction 55 to 60% with grade 1 diastolic dysfunction. Patient initially received IV Lasix , volume status has been better.   Type 2 diabetes. - Most recent A1c 6.9 - Started on metformin  - PCP follow up   Class III obesity with BMI 61.79. - Outpatient GLP-1 agonist therapy evaluation   Cigarette smoking - Cessation education performed at bedside         Subjective:  Patient still complains of significant cough, short of breath with duration.  No chest pain.  Physical Exam: Vitals:   06/26/24 2023 06/27/24 0436 06/27/24 0540 06/27/24 0853  BP: (!) 141/64 (!) 139/111 (!) 114/56 137/70  Pulse: 95 80 86 89  Resp: 17 16  18   Temp: 98.6 F (37 C) 98.1 F (36.7 C)  98.2 F (36.8 C)  TempSrc: Oral     SpO2: 93% 98%  92%  Weight:       General exam: Appears calm and comfortable, morbid obesity. Respiratory system: Decreased breath sounds. Respiratory effort normal. Cardiovascular system: S1 & S2 heard, RRR. No JVD, murmurs, rubs, gallops or clicks. No pedal edema. Gastrointestinal system: Abdomen is nondistended, soft and nontender. No organomegaly or masses felt. Normal bowel sounds heard. Central nervous system: Alert and oriented. No focal neurological deficits. Extremities: Symmetric 5 x 5 power. Skin: No rashes,  lesions or ulcers Psychiatry: Judgement and insight appear normal. Mood & affect appropriate.    Data Reviewed:  Echocardiogram results reviewed, x-ray reviewed, lab results reviewed.  Family Communication: None  Disposition: Status is: Inpatient Remains inpatient appropriate because: Severity of  disease,     Time spent: 35 minutes  Author: Murvin Mana, MD 06/27/2024 12:40 PM  For on call review [email or web address].

## 2024-06-27 NOTE — Progress Notes (Signed)
 Heart Failure Navigator Progress Note  Current Advanced Heart Failure Team patient.  Scheduled her 07/04/2024 @ 2:15 with Ezra Shuck, MD.  Education Assessment and Provision: Detailed education and instructions provided on heart failure disease management including the following:  Signs and symptoms of Heart Failure When to call the physician Importance of daily weights-patient has not been performing these because she did not have a scale.  Provided her with one to start doing daily weights. Low sodium diet-patient eat a diet high in sodium. Fluid restriction-patient currently drinks 2 gallons of Medication management Anticipated future follow-up appointments  Patient education given on each of the above topics.  Patient acknowledges understanding via teach back method and acceptance of all instructions.  Education Materials:  Living Better With Heart Failure Booklet, HF zone tool, & Daily Weight Tracker Tool.  Patient has scale at home: Navigator provided her with one. Patient has pill box at home: Navigator provided her with one.    Navigator available for reassessment of patient.   Charmaine Pines, RN, BSN [facility] Heart Failure Navigator Secure Chat Only

## 2024-06-27 NOTE — Progress Notes (Signed)
 SATURATION QUALIFICATIONS: (This note is used to comply with regulatory documentation for home oxygen)  Patient Saturations on Room Air at Rest = 90%  Patient Saturations on Room Air while Ambulating = 90%  Patient Saturations on 2 Liters of oxygen while Ambulating = 94%  Please briefly explain why patient needs home oxygen: Patient is requiring 2L [place] with activity and at rest during this time, drops to 90% O2 when 2L nasal cannula is removed. O2 sats came up to 94% when placed back on 2L [place].

## 2024-06-28 DIAGNOSIS — J441 Chronic obstructive pulmonary disease with (acute) exacerbation: Secondary | ICD-10-CM | POA: Diagnosis not present

## 2024-06-28 LAB — GLUCOSE, CAPILLARY
Glucose-Capillary: 403 mg/dL — ABNORMAL HIGH (ref 70–99)
Glucose-Capillary: 363 mg/dL — ABNORMAL HIGH (ref 70–99)
Glucose-Capillary: 317 mg/dL — ABNORMAL HIGH (ref 70–99)

## 2024-06-28 MED ORDER — HYDROCOD POLI-CHLORPHE POLI ER 10-8 MG/5ML PO SUER
5.0000 mL | Freq: Two times a day (BID) | ORAL | Status: DC | PRN
Start: 2024-06-28 — End: 2024-06-29
  Administered 2024-06-28: 5 mL via ORAL
  Filled 2024-06-28: qty 5

## 2024-06-28 MED ORDER — INSULIN GLARGINE-YFGN 100 UNIT/ML ~~LOC~~ SOLN
20.0000 [IU] | Freq: Every day | SUBCUTANEOUS | Status: DC
  Administered 2024-06-28 – 2024-06-29 (×2): 20 [IU] via SUBCUTANEOUS
  Filled 2024-06-28 (×2): qty 0.2

## 2024-06-28 MED ORDER — INSULIN ASPART 100 UNIT/ML IJ SOLN
0.0000 [IU] | Freq: Three times a day (TID) | INTRAMUSCULAR | Status: DC
  Administered 2024-06-28: 15 [IU] via SUBCUTANEOUS
  Administered 2024-06-29: 2 [IU] via SUBCUTANEOUS
  Filled 2024-06-28 (×2): qty 1

## 2024-06-28 MED ORDER — INSULIN ASPART 100 UNIT/ML IJ SOLN
15.0000 [IU] | Freq: Once | INTRAMUSCULAR | Status: AC
  Administered 2024-06-28: 15 [IU] via SUBCUTANEOUS
  Filled 2024-06-28: qty 1

## 2024-06-28 MED ORDER — INSULIN GLARGINE-YFGN 100 UNIT/ML ~~LOC~~ SOPN: 10.0000 [IU] | PEN_INJECTOR | Freq: Every day | SUBCUTANEOUS | Status: DC

## 2024-06-28 MED ORDER — IPRATROPIUM-ALBUTEROL 0.5-2.5 (3) MG/3ML IN SOLN
3.0000 mL | Freq: Four times a day (QID) | RESPIRATORY_TRACT | Status: DC
  Administered 2024-06-28 – 2024-06-29 (×4): 3 mL via RESPIRATORY_TRACT
  Filled 2024-06-28 (×3): qty 3

## 2024-06-28 MED ORDER — VITAMIN D (ERGOCALCIFEROL) 1.25 MG (50000 UNIT) PO CAPS
50000.0000 [IU] | ORAL_CAPSULE | ORAL | Status: DC
Start: 2024-06-28 — End: 2024-08-23
  Administered 2024-06-28: 50000 [IU] via ORAL
  Filled 2024-06-28: qty 1

## 2024-06-28 MED ORDER — GUAIFENESIN ER 600 MG PO TB12
600.0000 mg | ORAL_TABLET | Freq: Two times a day (BID) | ORAL | Status: DC
  Administered 2024-06-28 – 2024-06-29 (×3): 600 mg via ORAL
  Filled 2024-06-28 (×3): qty 1

## 2024-06-28 NOTE — Progress Notes (Signed)
 SATURATION QUALIFICATIONS: (This note is used to comply with regulatory documentation for home oxygen)  Patient Saturations on Room Air at Rest = 92%  Patient Saturations on Room Air while Ambulating = 86%  Patient Saturations on 2 Liters of oxygen while Ambulating = 94%  Please briefly explain why patient needs home oxygen: patient gets short of breath with ambulating and pulse ox drops.

## 2024-06-28 NOTE — Plan of Care (Signed)
  Problem: Education: Goal: Knowledge of General Education information will improve Description: Including pain rating scale, medication(s)/side effects and non-pharmacologic comfort measures Outcome: Progressing   Problem: Clinical Measurements: Goal: Ability to maintain clinical measurements within normal limits will improve Outcome: Progressing Goal: Will remain free from infection Outcome: Progressing Goal: Diagnostic test results will improve Outcome: Progressing Goal: Respiratory complications will improve Outcome: Progressing Goal: Cardiovascular complication will be avoided Outcome: Progressing   Problem: Activity: Goal: Risk for activity intolerance will decrease Outcome: Progressing   Problem: Pain Managment: Goal: General experience of comfort will improve and/or be controlled Outcome: Progressing   Problem: Skin Integrity: Goal: Risk for impaired skin integrity will decrease Outcome: Progressing   Problem: Activity: Goal: Ability to tolerate increased activity will improve Outcome: Progressing Goal: Will verbalize the importance of balancing activity with adequate rest periods Outcome: Progressing   Problem: Respiratory: Goal: Ability to maintain a clear airway will improve Outcome: Progressing Goal: Levels of oxygenation will improve Outcome: Progressing Goal: Ability to maintain adequate ventilation will improve Outcome: Progressing

## 2024-06-28 NOTE — Plan of Care (Addendum)
 A&O x 4 with reports of no pain. Expiratory wheezing to right lower lobe reported to RT and prn neb administered with effective results.  Ms. Slagel Refused bedtime dose of Wellbutrin  and Vraylar  and education provided in reference to possible negative effects and mood changes to which she stated understanding.  Problem: Clinical Measurements: Goal: Respiratory complications will improve Outcome: Progressing   Problem: Clinical Measurements: Goal: Cardiovascular complication will be avoided Outcome: Progressing   Problem: Activity: Goal: Risk for activity intolerance will decrease Outcome: Progressing

## 2024-06-28 NOTE — Progress Notes (Signed)
 [facility] Progress Note  Patient: Krista Mcgee    FMW:[id number]  DOA: 06/25/2024     Date of Service: the patient was seen and examined on 06/28/2024  Chief Complaint  Patient presents with   Chest Pain   Brief hospital course: Krista Mcgee is a 48 y.o. female with medical history significant of COPD, cigarette smoking, morbid obesity, chronic HFpEF, morbid obesity, chronic lymphedema, OSA on HF BiPAP, IIDM, presented with worsening of cough wheezing shortness breath.  Symptoms started 2 days ago, initially patient started develop runny nose congestion and sore throat then she started to feel increasing shortness was dry cough and wheezing, denied any chest pain no fever or chills.  She has tried around-the-clock bronchodilators with no significant improvement.  She also noticed increasing left-sided leg swelling.   ED Course: Afebrile, tachycardia borderline, heart rate 105 blood pressure 119/87 O2 saturation 92% on 2 L.  Chest x-ray showed mild pulmonary congestion, blood work showed BUN 11 creatinine 0.8 WBC 10.3 hemoglobin 13.1 VBG 7.3 9/47/56.   Patient was given IV Solu-Medrol , bronchodilator, doxycycline in the ED.     Pt was admitted for further evaluation and management of COPD with acute exacerbation.   Respiratory panel subsequently positive for rhinovirus.   Patient was to be titrated with IV Lasix  for mild pulmonary edema also likely contributing to her respiratory symptoms.      Assessment and Plan:  # Acute hypoxic respiratory failure due to rhinovirus infection And acute on chronic HFpEF decompensation and acute COPD exacerbation.  Continue supplemental O2 admission and gradually wean off Patient may qualify for home oxygen if no improvement    # Acute COPD exacerbation # Rhinovirus infection S/p IV Solu-Medrol , transition to oral prednisone  Continue Breztri  inhaler Continue DuoNeb every 6 hours Mucinex  600 twice daily Tussionex prn    # Acute  on chronic HFpEF Volume status on exam is difficult due to body habitus.  CXR with some pulmonary edema. Her cardiogram showed ejection fraction 55 to 60% with grade 1 diastolic dysfunction. Patient initially received IV Lasix , volume status has been better.   # Type 2 diabetes. - Most recent A1c 6.9 - Started on metformin  Started NovoLog sliding scale, monitor CBG, continue diabetic diet - PCP follow up   # Vitamin D  deficiency: started vitamin D  50,000 units p.o. weekly, follow with PCP to repeat vitamin D  level after 3 to 6 months.   Cigarette smoking: Smoking cessation counseling   Super super morbid obesity Body mass index is 61.79 kg/m.  Interventions: Calorie restricted diet and daily exercise advised to lose body weight.  Lifestyle modification discussed. - Outpatient GLP-1 agonist therapy evaluation  Diet: Carb modified diet DVT Prophylaxis: Subcutaneous Lovenox    Advance goals of care discussion: Full code  Family Communication: family was not present at bedside, at the time of interview.  The pt provided permission to discuss medical plan with the family. Opportunity was given to ask question and all questions were answered satisfactorily.   Disposition:  Pt is from Home, admitted with Resp failure, still on supplemental O2 , which precludes a safe discharge. Discharge to home, when stable, most likely tomorrow a.m.  Subjective: No significant events overnight, patient still having productive cough and shortness of breath, requiring 2 L oxygen via nasal cannula.  Denies any chest pain or palpitation, no any other complaints.  Physical Exam: General: NAD, lying comfortably Appear in no distress, affect appropriate Eyes: PERRLA ENT: Oral Mucosa Clear, moist  Neck: no  JVD,  Cardiovascular: S1 and S2 Present, no Murmur,  Respiratory: good respiratory effort, Bilateral Air entry equal and Decreased, bilateral crackles and mild wheezes Abdomen: Bowel Sound present,  Soft and no tenderness,  Skin: no rashes Extremities: no Pedal edema, no calf tenderness Neurologic: without any new focal findings Gait not checked due to patient safety concerns  Vitals:   06/27/24 2026 06/27/24 2309 06/28/24 0407 06/28/24 0840  BP: (!) 125/58  (!) 97/53 128/67  Pulse: 65  68 76  Resp: 19  19 16   Temp: 97.7 F (36.5 C)  97.6 F (36.4 C) (!) 97.5 F (36.4 C)  TempSrc: Oral     SpO2: 97% 97% 99% 96%  Weight:        Intake/Output Summary (Last 24 hours) at 06/28/2024 1425 Last data filed at 06/28/2024 0900 Gross per 24 hour  Intake 360 ml  Output --  Net 360 ml   Filed Weights   06/25/24 0926  Weight: (!) 163.3 kg    Data Reviewed: I have personally reviewed and interpreted daily labs, tele strips, imagings as discussed above. I reviewed all nursing notes, pharmacy notes, vitals, pertinent old records I have discussed plan of care as described above with RN and patient/family.  CBC: Recent Labs  Lab 06/25/24 0939  WBC 10.3  NEUTROABS 6.5  HGB 13.1  HCT 41.6  MCV 92.0  PLT 187   Basic Metabolic Panel: Recent Labs  Lab 06/25/24 0939  NA 137  K 3.9  CL 102  CO2 23  GLUCOSE 185*  BUN 11  CREATININE 0.84  CALCIUM  9.0    Studies: No results found.  Scheduled Meds:  budesonide -glycopyrrolate-formoterol   2 puff Inhalation BID   buPROPion   300 mg Oral QHS   cariprazine   6 mg Oral Daily   carvedilol  6.25 mg Oral BID WC   enoxaparin  (LOVENOX ) injection  80 mg Subcutaneous Q24H   guaiFENesin   600 mg Oral BID   insulin  aspart  0-15 Units Subcutaneous TID WC   ipratropium-albuterol   3 mL Nebulization Q6H   lisinopril  20 mg Oral Daily   metFORMIN   500 mg Oral BID WC   nicotine   21 mg Transdermal Daily   predniSONE   40 mg Oral Q breakfast   Vitamin D  (Ergocalciferol )  50,000 Units Oral Q7 days   Continuous Infusions: PRN Meds: acetaminophen  **OR** acetaminophen , ALPRAZolam , baclofen , chlorpheniramine-HYDROcodone , hydrALAZINE, ondansetron   **OR** ondansetron  (ZOFRAN ) IV, pregabalin , senna-docusate, zolpidem   Time spent: 55 minutes  Author: ELVAN SOR. MD [facility] 06/28/2024 2:25 PM  To reach On-call, see care teams to locate the attending and reach out to them via [email or web address]. If 7PM-7AM, please contact night-coverage If you still have difficulty reaching the attending provider, please page the [facility] (Director on Call) for [facility] on amion for assistance.

## 2024-06-29 ENCOUNTER — Other Ambulatory Visit: Payer: Self-pay

## 2024-06-29 DIAGNOSIS — J441 Chronic obstructive pulmonary disease with (acute) exacerbation: Secondary | ICD-10-CM | POA: Diagnosis not present

## 2024-06-29 LAB — CBC
HCT: 41.1 % (ref 36.0–46.0)
Hemoglobin: 13.2 g/dL (ref 12.0–15.0)
MCH: 29.2 pg (ref 26.0–34.0)
MCHC: 32.1 g/dL (ref 30.0–36.0)
MCV: 90.9 fL (ref 80.0–100.0)
Platelets: 221 K/uL (ref 150–400)
RBC: 4.52 MIL/uL (ref 3.87–5.11)
RDW: 14.5 % (ref 11.5–15.5)
WBC: 13.6 K/uL — ABNORMAL HIGH (ref 4.0–10.5)
nRBC: 0 % (ref 0.0–0.2)

## 2024-06-29 LAB — BASIC METABOLIC PANEL WITH GFR
Anion gap: 9 (ref 5–15)
BUN: 35 mg/dL — ABNORMAL HIGH (ref 6–20)
CO2: 27 mmol/L (ref 22–32)
Calcium: 8.9 mg/dL (ref 8.9–10.3)
Chloride: 102 mmol/L (ref 98–111)
Creatinine, Ser: 0.97 mg/dL (ref 0.44–1.00)
GFR, Estimated: >60 mL/min (ref >60)
Glucose, Bld: 164 mg/dL — ABNORMAL HIGH (ref 70–99)
Potassium: 4.2 mmol/L (ref 3.5–5.1)
Sodium: 138 mmol/L (ref 135–145)

## 2024-06-29 LAB — GLUCOSE, CAPILLARY
Glucose-Capillary: 146 mg/dL — ABNORMAL HIGH (ref 70–99)
Glucose-Capillary: 160 mg/dL — ABNORMAL HIGH (ref 70–99)

## 2024-06-29 LAB — IRON AND TIBC
Iron: 76 ug/dL (ref 28–170)
Saturation Ratios: 22 % (ref 10.4–31.8)
TIBC: 342 ug/dL (ref 250–450)
UIBC: 266 ug/dL

## 2024-06-29 LAB — MAGNESIUM: Magnesium: 2.4 mg/dL (ref 1.7–2.4)

## 2024-06-29 LAB — FOLATE: Folate: 8.4 ng/mL (ref >5.9)

## 2024-06-29 LAB — PHOSPHORUS: Phosphorus: 4.3 mg/dL (ref 2.5–4.6)

## 2024-06-29 LAB — VITAMIN B12: Vitamin B-12: 282 pg/mL (ref 180–914)

## 2024-06-29 MED ORDER — PREDNISONE 20 MG PO TABS
20.0000 mg | ORAL_TABLET | Freq: Every day | ORAL | 0 refills | Status: AC
  Filled 2024-06-29: qty 3, 3d supply, fill #0

## 2024-06-29 MED ORDER — ACCU-CHEK GUIDE ME W/DEVICE KIT
1.0000 | PACK | 0 refills | Status: AC
  Filled 2024-06-29: qty 1, 90d supply, fill #0

## 2024-06-29 MED ORDER — BLOOD GLUCOSE TEST VI STRP
1.0000 | ORAL_STRIP | 0 refills | Status: AC
  Filled 2024-06-29: qty 100, 25d supply, fill #0

## 2024-06-29 MED ORDER — FOLIC ACID 1 MG PO TABS
1.0000 mg | ORAL_TABLET | Freq: Every day | ORAL | Status: DC
  Administered 2024-06-29: 1 mg via ORAL
  Filled 2024-06-29: qty 1

## 2024-06-29 MED ORDER — CARVEDILOL 6.25 MG PO TABS: 3.1250 mg | ORAL_TABLET | Freq: Two times a day (BID) | ORAL | Status: DC

## 2024-06-29 MED ORDER — LISINOPRIL 10 MG PO TABS: 10.0000 mg | ORAL_TABLET | Freq: Every day | ORAL | Status: DC

## 2024-06-29 MED ORDER — BREZTRI AEROSPHERE 160-9-4.8 MCG/ACT IN AERO
2.0000 | INHALATION_SPRAY | Freq: Two times a day (BID) | RESPIRATORY_TRACT | 11 refills | Status: AC
Start: 2024-06-29 — End: ?
  Filled 2024-06-29 (×2): qty 10.7, 30d supply, fill #0

## 2024-06-29 MED ORDER — ACCU-CHEK SOFTCLIX LANCET DEV KIT
1.0000 | PACK | 0 refills | Status: AC
  Filled 2024-06-29: qty 1, 90d supply, fill #0

## 2024-06-29 MED ORDER — FOLIC ACID 1 MG PO TABS
1.0000 mg | ORAL_TABLET | Freq: Every day | ORAL | 0 refills | Status: AC
  Filled 2024-06-29: qty 30, 30d supply, fill #0

## 2024-06-29 MED ORDER — ALBUTEROL SULFATE HFA 108 (90 BASE) MCG/ACT IN AERS
2.0000 | INHALATION_SPRAY | Freq: Four times a day (QID) | RESPIRATORY_TRACT | 2 refills | Status: AC | PRN
  Filled 2024-06-29: qty 18, 30d supply, fill #0

## 2024-06-29 MED ORDER — ACCU-CHEK SOFTCLIX LANCETS MISC
1.0000 | Freq: Four times a day (QID) | 0 refills | Status: AC
  Filled 2024-06-29: qty 100, 25d supply, fill #0

## 2024-06-29 MED ORDER — ATORVASTATIN CALCIUM 10 MG PO TABS
10.0000 mg | ORAL_TABLET | Freq: Every day | ORAL | 11 refills | Status: DC
Start: 2024-06-29 — End: 2024-07-04
  Filled 2024-06-29: qty 30, 30d supply, fill #0

## 2024-06-29 MED ORDER — CARVEDILOL 3.125 MG PO TABS
3.1250 mg | ORAL_TABLET | Freq: Two times a day (BID) | ORAL | 11 refills | Status: AC
  Filled 2024-06-29: qty 60, 30d supply, fill #0

## 2024-06-29 MED ORDER — LISINOPRIL 10 MG PO TABS
10.0000 mg | ORAL_TABLET | Freq: Every day | ORAL | 11 refills | Status: AC
  Filled 2024-06-29: qty 30, 30d supply, fill #0

## 2024-06-29 MED ORDER — HYDROCOD POLI-CHLORPHE POLI ER 10-8 MG/5ML PO SUER
5.0000 mL | Freq: Two times a day (BID) | ORAL | 0 refills | Status: AC | PRN
  Filled 2024-06-29: qty 70, 7d supply, fill #0

## 2024-06-29 MED ORDER — METFORMIN HCL 500 MG PO TABS
500.0000 mg | ORAL_TABLET | Freq: Two times a day (BID) | ORAL | 11 refills | Status: AC
  Filled 2024-06-29: qty 60, 30d supply, fill #0

## 2024-06-29 MED ORDER — GUAIFENESIN ER 600 MG PO TB12
600.0000 mg | ORAL_TABLET | Freq: Two times a day (BID) | ORAL | 0 refills | Status: AC
  Filled 2024-06-29: qty 14, 7d supply, fill #0

## 2024-06-29 NOTE — Inpatient Diabetes Management (Addendum)
 Inpatient Diabetes Program Recommendations  AACE/ADA: New Consensus Statement on Inpatient Glycemic Control (2015)  Target Ranges:  Prepandial:   less than 140 mg/dL      Peak postprandial:   less than 180 mg/dL (1-2 hours)      Critically ill patients:  140 - 180 mg/dL    Latest Reference Range & Units 05/04/24 11:24  Hemoglobin A1C <5.7 % 6.9 (H)    Latest Reference Range & Units 06/28/24 17:27 06/28/24 18:45 06/28/24 20:17 06/29/24 07:59  Glucose-Capillary 70 - 99 mg/dL 596 (H)  15 units Novolog @1640  363 (H)  20 units Semglee @1805  317 (H)  15 units Novolog @2002  146 (H)  (H): Data is abnormally high   Admit with:  Acute hypoxic respiratory failure Likely secondary to combined effect of acute on chronic HFpEF decompensation as well as acute COPD exacerbation  History: DM2  Home DM Meds: None Listed  Current Orders: Semglee 20 units daily     Novolog Moderate Correction Scale/ SSI (0-15 units) TID AC      Metformin  500 mg BID     Note pt getting Prednisone  40 mg daily (switched from Solumedrol to Prednisone  11/04) Semglee and Novolog both started last evening  Addendum 11:25am--Met w/ pt at bedside this AM.  Explained to pt that her CBGs were likely severely elevated last PM due to the effects of the IV Steroids.  Explained to pt we have converted her to Prednisone  and MD started her on Insulin  for in hospital glucose mgmt--Pt stated she understood and was OK with this.  Pt confirms she has not been taking any meds since 19-Sep-2023 and confirms last visit with PCP in Sept.  Discussed with pt that MD plans to d/c pt home with Metformin  and pt stated she was OK with this--Has taken Metformin  before.  Pt told me she plans to talk with PCP about possibly starting Mounjaro or Zepbound for weight loss (did not tolerate Ozempic ).  Has CBG meter at home.     PCP: Michelene Cower, PA with [facility] Last seen 05/04/2024 Has been off all diabetes meds since the  death of her husband 19-Sep-2023) Since A1c was 6.9%, PCP did not restart DM meds    --Will follow patient during hospitalization--  Adina Rudolpho Arrow RN, MSN, CDCES Diabetes Coordinator Inpatient Glycemic Control Team Team Pager: [phone] (8a-5p)

## 2024-06-29 NOTE — TOC Transition Note (Signed)
 Transition of Care [facility]) - Discharge Note   Patient Details  Name: Krista Mcgee MRN: [id number] Date of Birth: 25-Aug-1975  Transition of Care [facility]) CM/SW Contact:  Corean ONEIDA Haddock, RN Phone Number: 06/29/2024, 11:02 AM   Clinical Narrative:     Patient to discharge today Noted orders for Bariatric rollator and O2 Patient states that she has Bipap through [employer] and would like to use them Referral made to [facility] with [employer] Patient states daughter will be transporting at discharge         Patient Goals and CMS Choice            Discharge Placement                       Discharge Plan and Services Additional resources added to the After Visit Summary for                                       Social Drivers of Health (SDOH) Interventions SDOH Screenings   Food Insecurity: Food Insecurity Present (06/25/2024)  Housing: High Risk (06/25/2024)  Transportation Needs: Unmet Transportation Needs (06/25/2024)  Utilities: Not At Risk (06/25/2024)  Alcohol Screen: Low Risk  (11/30/2022)  Depression (PHQ2-9): High Risk (05/04/2024)  Financial Resource Strain: High Risk (05/04/2024)  Physical Activity: Inactive (05/04/2024)  Social Connections: Socially Isolated (05/04/2024)  Stress: Stress Concern Present (05/04/2024)  Tobacco Use: Medium Risk (06/25/2024)  Health Literacy: Adequate Health Literacy (05/04/2024)     Readmission Risk Interventions     No data to display

## 2024-06-29 NOTE — Progress Notes (Signed)
 Patient will require bariatric rollator due to ambulatory dysfunction.  Rollator to aide with this

## 2024-06-29 NOTE — Discharge Summary (Signed)
 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 Follow up LABS/TEST:     Follow-up Information     [facility]. Go on 07/04/2024.   Specialty: Cardiology Why: Hospital Follow-Up 07/04/2024 @ 2:15PM Please bring all mediations to follow-up appointment [facility], [address] Free Valet Parking at the door. Contact information: [address] [place]  [address] [phone]        Leavy Mole, PA-C Follow up.   Specialty: Family Medicine Why: Patient has an appointment on July 03, 2024 at 2pm. Contact information: [address] [place] [address] [phone]                Diet recommendation: Cardiac and Carb modified diet  Activity: The patient is advised to gradually reintroduce usual activities, as tolerated  Discharge Condition: stable  Code Status: Full code   History of present illness: As per the H and P dictated on admission.  Hospital Course:  Krista Mcgee is a 48 y.o. female with medical history significant of COPD, cigarette smoking, morbid obesity, chronic HFpEF, morbid obesity, chronic lymphedema, OSA on HF BiPAP, IIDM, presented with worsening of cough wheezing shortness breath.  Symptoms started  2 days ago, initially patient started develop runny nose congestion and sore throat then she started to feel increasing shortness was dry cough and wheezing, denied any chest pain no fever or chills.  She has tried around-the-clock bronchodilators with no significant improvement.  She also noticed increasing left-sided leg swelling.   ED Course: Afebrile, tachycardia borderline, heart rate 105 blood pressure 119/87 O2 saturation 92% on 2 L.  Chest x-ray showed mild pulmonary congestion, blood work showed BUN 11 creatinine 0.8 WBC 10.3 hemoglobin 13.1 VBG 7.3 9/47/56.   Patient was given IV Solu-Medrol , bronchodilator, doxycycline in the ED.  Pt was admitted for further evaluation and management of COPD with acute exacerbation. Respiratory panel subsequently positive for rhinovirus.   Patient was to be titrated with IV Lasix  for mild pulmonary edema also likely contributing to her respiratory symptoms.      Assessment and Plan:   # Acute hypoxic respiratory failure due to rhinovirus infection And acute on chronic HFpEF decompensation and acute COPD exacerbation. Continue supplemental O2 admission and gradually wean off.  Patient qualified for home oxygen which was arranged by TOC.  Patient was advised to monitor pulse ox at home and gradually wean off herself, explained to her to keep O2 sat >94%.  Follow-up with PCP for further management as an outpatient.     # Acute COPD exacerbation # Rhinovirus infection S/p IV Solu-Medrol , transition to oral prednisone  Continue Breztri  inhaler, s/p DuoNeb every 6 hours Mucinex  600 twice daily, Tussionex prn    # Acute on chronic HFpEF Volume status on exam is  difficult due to body habitus.  CXR with some pulmonary edema. Her cardiogram showed ejection fraction 55 to 60% with grade 1 diastolic dysfunction. Patient initially received IV Lasix , volume status has been better. Continued home dose of Lasix    # Type 2 diabetes: Most recent A1c 6.9 Started  on metformin . S/p Semglee 20 units and 1 dose given, started NovoLog sliding scale.  Blood glucose is well-controlled now, hyperglycemia secondary to steroids.  Patient was discharged on metformin  and NovoLog sliding scale.  Advised to monitor CBG and continue diabetic diet, follow-up with PCP for further management as an outpatient.  # Vitamin D  deficiency: started vitamin D  50,000 units p.o. weekly, follow with PCP to repeat vitamin D  level after 3 to 6 months.   # Cigarette smoking: Smoking cessation counseling   # Super super morbid obesity Body mass index is 61.79 kg/m.  Interventions: Calorie restricted diet and daily exercise advised to lose body weight.  Lifestyle modification discussed. - Outpatient GLP-1 agonist therapy evaluation  Body mass index is 61.79 kg/m.  Nutrition Interventions:  Patient was ambulatory without any assistance.  On the day of the discharge the patient's vitals were stable, and no other acute medical condition were reported by patient. the patient was felt safe to be discharge at Home.  Consultants: None Procedures: None  Discharge Exam: General: Appear in no distress, Oral Mucosa Clear, moist. Cardiovascular: S1 and S2 Present, no Murmur, Respiratory: normal respiratory effort, Bilateral Air entry present and no Crackles, no wheezes Abdomen: Bowel Sound present, Soft and no tenderness. Extremities: no Pedal edema, no calf tenderness Neurology: alert and oriented to time, place, and person affect appropriate.  Filed Weights   06/25/24 0926  Weight: (!) 163.3 kg   Vitals:   06/29/24 0418 06/29/24 0802  BP: (!) 107/55 (!) 113/55  Pulse: (!) 103 65  Resp: 19 17  Temp:    SpO2: 97% 100%    DISCHARGE MEDICATION: Allergies as of 06/29/2024   No Known Allergies      Medication List     STOP taking these medications    desvenlafaxine 100 MG 24 hr tablet Commonly known as: PRISTIQ   naproxen  500 MG tablet Commonly known as: Naprosyn     ondansetron  4 MG disintegrating tablet Commonly known as: ZOFRAN -ODT   potassium chloride  SA 20 MEQ tablet Commonly known as: KLOR-CON  M       TAKE these medications    albuterol  108 (90 Base) MCG/ACT inhaler Commonly known as: Ventolin  HFA Inhale 1-2 puffs into the lungs every 6 (six) hours as needed for wheezing or shortness of breath.   ALPRAZolam  1 MG tablet Commonly known as: XANAX  TAKE 1 TABLET BY MOUTH 3 TIMES A DAY AS NEEDED FOR ANXIETY   atorvastatin  10 MG tablet Commonly known as: LIPITOR Take 1 tablet (10 mg total) by mouth at bedtime. TAKE 1 TABLET BY MOUTH EVERYDAY.   Baclofen  5 MG Tabs Take 1 tablet (5 mg total) by mouth 3 (three) times daily as needed (msk pain or spasms). Can be sedating, do not take with other sedating medication   Blood Glucose Monitoring Suppl Devi 1 each by Does not apply route as directed. Dispense based on patient and insurance preference. Use up to four times daily as directed. (FOR ICD-10 E10.9, E11.9).   BLOOD GLUCOSE TEST STRIPS Strp 1 each by Does not apply route as directed. Dispense based on patient and insurance preference. Use up to four times daily as directed. (FOR ICD-10 E10.9, E11.9).  Breztri  Aerosphere 160-9-4.8 MCG/ACT Aero inhaler Generic drug: budesonide -glycopyrrolate-formoterol  Inhale 2 puffs into the lungs in the morning and at bedtime.   buPROPion  300 MG 24 hr tablet Commonly known as: WELLBUTRIN  XL Take 300 mg by mouth every morning.   carvedilol 3.125 MG tablet Commonly known as: COREG Take 1 tablet (3.125 mg total) by mouth 2 (two) times daily with a meal. Hold if systolic BP <110 mmHg and or heart rate <65   chlorpheniramine-HYDROcodone  10-8 MG/5ML Commonly known as: TUSSIONEX Take 5 mLs by mouth every 12 (twelve) hours as needed for cough.   folic acid 1 MG tablet Commonly known as: FOLVITE Take 1 tablet (1 mg total) by mouth daily. Start taking on: June 30, 2024   furosemide  20 MG  tablet Commonly known as: Lasix  Take 1 tablet (20 mg total) by mouth in the morning.   guaiFENesin  600 MG 12 hr tablet Commonly known as: MUCINEX  Take 1 tablet (600 mg total) by mouth 2 (two) times daily for 7 days.   Lancet Device Misc 1 each by Does not apply route as directed. Dispense based on patient and insurance preference. Use up to four times daily as directed. (FOR ICD-10 E10.9, E11.9).   Lancets Misc 1 each by Does not apply route as directed. Dispense based on patient and insurance preference. Use up to four times daily as directed. (FOR ICD-10 E10.9, E11.9).   lisinopril 10 MG tablet Commonly known as: ZESTRIL Take 1 tablet (10 mg total) by mouth daily. Hold if systolic BP less than 120 Start taking on: June 30, 2024   metFORMIN  500 MG tablet Commonly known as: GLUCOPHAGE  Take 1 tablet (500 mg total) by mouth 2 (two) times daily with a meal.   predniSONE  20 MG tablet Commonly known as: DELTASONE  Take 1 tablet (20 mg total) by mouth daily with breakfast for 3 days. Start taking on: June 30, 2024   pregabalin  25 MG capsule Commonly known as: LYRICA  Take 1 capsule (25 mg total) by mouth 2 (two) times daily.   Vitamin D  (Ergocalciferol ) 1.25 MG (50000 UNIT) Caps capsule Commonly known as: DRISDOL  Take 1 capsule (50,000 Units total) by mouth every 7 (seven) days. x12 weeks.   Vraylar  6 MG Caps Generic drug: Cariprazine  HCl Take 1 capsule by mouth daily.   zolpidem  10 MG tablet Commonly known as: AMBIEN  Take 10 mg by mouth at bedtime as needed.               Durable Medical Equipment  (From admission, onward)           Start     Ordered   06/29/24 1054  For home use only DME 4 wheeled rolling walker with seat  Once       Comments: Bariatric  Question:  Patient needs a walker to treat with the following condition  Answer:  Weakness   06/29/24 1053   06/28/24 1140  For home use only DME oxygen  Once       Question Answer Comment  Length of  Need 6 Months   Mode or (Route) Nasal cannula   Liters per Minute 2   Oxygen conserving device Yes   Oxygen delivery system: Gas   Oxygen delivery system: Concentrator      06/28/24 1139   06/27/24 1545  For home use only DME Walker rolling  Once       Question Answer Comment  Walker: With 5 Inch Wheels   Patient needs a walker to treat  with the following condition COPD exacerbation (HCC)      06/27/24 1544           No Known Allergies Discharge Instructions     Amb Referral to Nutrition and Diabetic Education   Complete by: As directed    Call MD for:   Complete by: As directed    Uncontrolled blood glucose   Call MD for:  difficulty breathing, headache or visual disturbances   Complete by: As directed    Call MD for:  extreme fatigue   Complete by: As directed    Call MD for:  persistant dizziness or light-headedness   Complete by: As directed    Call MD for:  persistant nausea and vomiting   Complete by: As directed    Call MD for:  severe uncontrolled pain   Complete by: As directed    Call MD for:  temperature >100.4   Complete by: As directed    Diet - low sodium heart healthy   Complete by: As directed    Diet Carb Modified   Complete by: As directed    Discharge instructions   Complete by: As directed    Follow-up with PCP in 1 week Monitor CBG at home and use NovoLog sliding scale, continue diabetic diet.   Increase activity slowly   Complete by: As directed        The results of significant diagnostics from this hospitalization (including imaging, microbiology, ancillary and laboratory) are listed below for reference.    Significant Diagnostic Studies: ECHOCARDIOGRAM COMPLETE Result Date: 06/26/2024    ECHOCARDIOGRAM REPORT   Patient Name:   Krista Mcgee Date of Exam: 06/26/2024 Medical Rec #:  [id number]        Height:       64.0 in Accession #:    [id number]       Weight:       360.0 lb Date of Birth:  [birth year]        BSA:          2.512 m  Patient Age:    48 years         BP:           151/73 mmHg Patient Gender: F                HR:           87 bpm. Exam Location:  [facility] Procedure: 2D Echo, Color Doppler, Cardiac Doppler and Intracardiac            Opacification Agent (Both Spectral and Color Flow Doppler were            utilized during procedure). Indications:     CHF-Acute Diastolic I50.31  History:         Patient has prior history of Echocardiogram examinations, most                  recent 10/20/2021.  Sonographer:     Rosina Dunk Referring Phys:  [id number] CORT ONEIDA MANA Diagnosing Phys: Deatrice Cage MD  Sonographer Comments: Technically difficult study due to poor echo windows and patient is obese. Image acquisition challenging due to respiratory motion. IMPRESSIONS  1. Left ventricular ejection fraction, by estimation, is 55 to 60%. The left ventricle has normal function. Left ventricular endocardial border not optimally defined to evaluate regional wall motion. Left ventricular diastolic parameters are consistent with Grade I diastolic dysfunction (impaired relaxation).  2. Right ventricular systolic function is normal. The  right ventricular size is normal. Tricuspid regurgitation signal is inadequate for assessing PA pressure.  3. The mitral valve was not well visualized. No evidence of mitral valve regurgitation. No evidence of mitral stenosis.  4. The aortic valve was not well visualized. Aortic valve regurgitation is not visualized. No aortic stenosis is present.  5. Challenging image quality. FINDINGS  Left Ventricle: Left ventricular ejection fraction, by estimation, is 55 to 60%. The left ventricle has normal function. Left ventricular endocardial border not optimally defined to evaluate regional wall motion. Definity  contrast agent was given IV to delineate the left ventricular endocardial borders. The left ventricular internal cavity size was normal in size. There is no left ventricular hypertrophy. Left ventricular diastolic  parameters are consistent with Grade I diastolic dysfunction (impaired relaxation). Right Ventricle: The right ventricular size is normal. No increase in right ventricular wall thickness. Right ventricular systolic function is normal. Tricuspid regurgitation signal is inadequate for assessing PA pressure. Left Atrium: Left atrial size was normal in size. Right Atrium: Right atrial size was normal in size. Pericardium: There is no evidence of pericardial effusion. Mitral Valve: The mitral valve was not well visualized. No evidence of mitral valve regurgitation. No evidence of mitral valve stenosis. MV peak gradient, 7.2 mmHg. The mean mitral valve gradient is 4.0 mmHg. Tricuspid Valve: The tricuspid valve is not well visualized. Tricuspid valve regurgitation is not demonstrated. No evidence of tricuspid stenosis. Aortic Valve: The aortic valve was not well visualized. Aortic valve regurgitation is not visualized. No aortic stenosis is present. Aortic valve mean gradient measures 5.0 mmHg. Aortic valve peak gradient measures 9.1 mmHg. Aortic valve area, by VTI measures 3.13 cm. Pulmonic Valve: The pulmonic valve was not well visualized. Pulmonic valve regurgitation is not visualized. No evidence of pulmonic stenosis. Aorta: The aortic root is normal in size and structure. Venous: The inferior vena cava was not well visualized. IAS/Shunts: No atrial level shunt detected by color flow Doppler.  LEFT VENTRICLE PLAX 2D LVOT diam:     2.20 cm     Diastology LV SV:         75          LV e' medial:    7.94 cm/s LV SV Index:   30          LV E/e' medial:  9.1 LVOT Area:     3.80 cm    LV e' lateral:   5.77 cm/s LV IVRT:       106 msec    LV E/e' lateral: 12.5  LV Volumes (MOD) LV vol d, MOD A4C: 64.1 ml LV vol s, MOD A4C: 11.0 ml LV SV MOD A4C:     64.1 ml RIGHT VENTRICLE RV S prime:     16.80 cm/s TAPSE (M-mode): 2.6 cm AORTIC VALVE                    PULMONIC VALVE AV Area (Vmax):    3.78 cm     PV Vmax:        1.40 m/s  AV Area (Vmean):   3.60 cm     PV Vmean:       92.300 cm/s AV Area (VTI):     3.13 cm     PV VTI:         0.207 m AV Vmax:           151.00 cm/s  PV Peak grad:   7.8 mmHg AV Vmean:  99.600 cm/s  PV Mean grad:   4.0 mmHg AV VTI:            0.241 m      RVOT Peak grad: 4 mmHg AV Peak Grad:      9.1 mmHg AV Mean Grad:      5.0 mmHg LVOT Vmax:         150.00 cm/s LVOT Vmean:        94.300 cm/s LVOT VTI:          0.198 m LVOT/AV VTI ratio: 0.82  AORTA Ao Root diam: 2.90 cm Ao Asc diam:  2.15 cm MITRAL VALVE MV Area (PHT): 4.68 cm     SHUNTS MV Area VTI:   3.27 cm     Systemic VTI:  0.20 m MV Peak grad:  7.2 mmHg     Systemic Diam: 2.20 cm MV Mean grad:  4.0 mmHg     Pulmonic VTI:  0.165 m MV Vmax:       1.34 m/s MV Vmean:      92.2 cm/s MV Decel Time: 162 msec MV E velocity: 72.20 cm/s MV A velocity: 115.00 cm/s MV E/A ratio:  0.63 Deatrice Cage MD Electronically signed by Deatrice Cage MD Signature Date/Time: 06/26/2024/1:17:27 PM    Final    DG Chest 1 View Result Date: 06/26/2024 EXAM: 1 VIEW(S) XRAY OF THE CHEST 06/26/2024 07:11:39 AM COMPARISON: 06/25/2024 CLINICAL HISTORY: CHF (congestive heart failure) (HCC) FINDINGS: LUNGS AND PLEURA: Exam detail diminished due to rotational artifact. No focal pulmonary opacity. Mild pulmonary edema. Small bilateral pleural effusions. No pneumothorax. HEART AND MEDIASTINUM: Stable cardiomegaly. No acute abnormality of the mediastinal silhouette. BONES AND SOFT TISSUES: No acute osseous abnormality. IMPRESSION: 1. Mild pulmonary edema. Electronically signed by: Waddell Calk MD 06/26/2024 07:16 AM EST RP Workstation: [id number]   US  Venous Img Lower Unilateral Left (DVT) Result Date: 06/25/2024 CLINICAL DATA:  Chronic leg edema. EXAM: LEFT LOWER EXTREMITY VENOUS DOPPLER ULTRASOUND TECHNIQUE: Gray-scale sonography with compression, as well as color and duplex ultrasound, were performed to evaluate the deep venous system(s) from the level of the common femoral  vein through the popliteal and proximal calf veins. COMPARISON:  None Available. FINDINGS: VENOUS Normal compressibility of the common femoral, profundus femoral, and popliteal veins. The saphenofemoral junction and calf veins are not visualized. No filling defects to suggest DVT on grayscale or color Doppler imaging. Doppler waveforms show normal direction of venous flow, normal respiratory plasticity and response to augmentation. Limited views of the contralateral common femoral vein are unremarkable. OTHER None. Limitations: Examination limited due to patient's body habitus and upright positioning due to shortness of breath. IMPRESSION: No definite evidence of deep venous thrombosis. Examination is limited due to patient's body habitus and the saphenofemoral junction and calf veins were not visualized on exam. Electronically Signed   By: Leita Waddell M.D.   On: 06/25/2024 14:27   DG Chest 2 View Result Date: 06/25/2024 EXAM: 2 VIEW(S) XRAY OF THE CHEST 06/25/2024 10:02:00 AM COMPARISON: 03/26/2024 CLINICAL HISTORY: dyspnea FINDINGS: LUNGS AND PLEURA: No focal pulmonary opacity. No pulmonary edema. No pleural effusion. No pneumothorax. HEART AND MEDIASTINUM: Cardiomegaly. BONES AND SOFT TISSUES: Thoracic dextroscoliosis noted. IMPRESSION: 1. No acute findings. 2. Cardiomegaly. 3. Thoracic dextroscoliosis. Electronically signed by: Evalene Coho MD 06/25/2024 10:05 AM EST RP Workstation: [id number]    Microbiology: Recent Results (from the past 240 hours)  Resp panel by RT-PCR (RSV, Flu A&B, Covid) Anterior Nasal Swab     Status: None  Collection Time: 06/25/24  9:39 AM   Specimen: Anterior Nasal Swab  Result Value Ref Range Status   SARS Coronavirus 2 by RT PCR NEGATIVE NEGATIVE Final    Comment: (NOTE) SARS-CoV-2 target nucleic acids are NOT DETECTED.  The SARS-CoV-2 RNA is generally detectable in upper respiratory specimens during the acute phase of infection. The lowest concentration of  SARS-CoV-2 viral copies this assay can detect is 138 copies/mL. A negative result does not preclude SARS-Cov-2 infection and should not be used as the sole basis for treatment or other patient management decisions. A negative result may occur with  improper specimen collection/handling, submission of specimen other than nasopharyngeal swab, presence of viral mutation(s) within the areas targeted by this assay, and inadequate number of viral copies(<138 copies/mL). A negative result must be combined with clinical observations, patient history, and epidemiological information. The expected result is Negative.  Fact Sheet for Patients:  bloggercourse.com  Fact Sheet for Healthcare Providers:  seriousbroker.it  This test is no t yet approved or cleared by the [place]  FDA and  has been authorized for detection and/or diagnosis of SARS-CoV-2 by FDA under an Emergency Use Authorization (EUA). This EUA will remain  in effect (meaning this test can be used) for the duration of the COVID-19 declaration under Section 564(b)(1) of the Act, 21 U.S.C.section 360bbb-3(b)(1), unless the authorization is terminated  or revoked sooner.       Influenza A by PCR NEGATIVE NEGATIVE Final   Influenza B by PCR NEGATIVE NEGATIVE Final    Comment: (NOTE) The Xpert Xpress SARS-CoV-2/FLU/RSV plus assay is intended as an aid in the diagnosis of influenza from Nasopharyngeal swab specimens and should not be used as a sole basis for treatment. Nasal washings and aspirates are unacceptable for Xpert Xpress SARS-CoV-2/FLU/RSV testing.  Fact Sheet for Patients: bloggercourse.com  Fact Sheet for Healthcare Providers: seriousbroker.it  This test is not yet approved or cleared by the [place]  FDA and has been authorized for detection and/or diagnosis of SARS-CoV-2 by FDA under an Emergency Use  Authorization (EUA). This EUA will remain in effect (meaning this test can be used) for the duration of the COVID-19 declaration under Section 564(b)(1) of the Act, 21 U.S.C. section 360bbb-3(b)(1), unless the authorization is terminated or revoked.     Resp Syncytial Virus by PCR NEGATIVE NEGATIVE Final    Comment: (NOTE) Fact Sheet for Patients: bloggercourse.com  Fact Sheet for Healthcare Providers: seriousbroker.it  This test is not yet approved or cleared by the [place]  FDA and has been authorized for detection and/or diagnosis of SARS-CoV-2 by FDA under an Emergency Use Authorization (EUA). This EUA will remain in effect (meaning this test can be used) for the duration of the COVID-19 declaration under Section 564(b)(1) of the Act, 21 U.S.C. section 360bbb-3(b)(1), unless the authorization is terminated or revoked.  Performed at [facility], [address] [place], [place] [address]   Respiratory (~20 pathogens) panel by PCR     Status: Abnormal   Collection Time: 06/25/24  1:16 PM   Specimen: Nasopharyngeal Swab; Respiratory  Result Value Ref Range Status   Adenovirus NOT DETECTED NOT DETECTED Final   Coronavirus 229E NOT DETECTED NOT DETECTED Final    Comment: (NOTE) The Coronavirus on the Respiratory Panel, DOES NOT test for the novel  Coronavirus (2019 nCoV)    Coronavirus HKU1 NOT DETECTED NOT DETECTED Final   Coronavirus NL63 NOT DETECTED NOT DETECTED Final   Coronavirus OC43 NOT DETECTED NOT DETECTED Final  Metapneumovirus NOT DETECTED NOT DETECTED Final   Rhinovirus / Enterovirus DETECTED (A) NOT DETECTED Final   Influenza A NOT DETECTED NOT DETECTED Final   Influenza B NOT DETECTED NOT DETECTED Final   Parainfluenza Virus 1 NOT DETECTED NOT DETECTED Final   Parainfluenza Virus 2 NOT DETECTED NOT DETECTED Final   Parainfluenza Virus 3 NOT DETECTED NOT DETECTED Final   Parainfluenza Virus 4  NOT DETECTED NOT DETECTED Final   Respiratory Syncytial Virus NOT DETECTED NOT DETECTED Final   Bordetella pertussis NOT DETECTED NOT DETECTED Final   Bordetella Parapertussis NOT DETECTED NOT DETECTED Final   Chlamydophila pneumoniae NOT DETECTED NOT DETECTED Final   Mycoplasma pneumoniae NOT DETECTED NOT DETECTED Final    Comment: Performed at [facility], [address], [place], [place] [address]     Labs: CBC: Recent Labs  Lab 06/25/24 0939 06/29/24 0331  WBC 10.3 13.6*  NEUTROABS 6.5  --   HGB 13.1 13.2  HCT 41.6 41.1  MCV 92.0 90.9  PLT 187 221   Basic Metabolic Panel: Recent Labs  Lab 06/25/24 0939 06/29/24 0331  NA 137 138  K 3.9 4.2  CL 102 102  CO2 23 27  GLUCOSE 185* 164*  BUN 11 35*  CREATININE 0.84 0.97  CALCIUM  9.0 8.9  MG  --  2.4  PHOS  --  4.3   Liver Function Tests: Recent Labs  Lab 06/25/24 0939  AST 37  ALT 30  ALKPHOS 77  BILITOT 1.2  PROT 7.8  ALBUMIN 3.6   No results for input(s): LIPASE, AMYLASE in the last 168 hours. No results for input(s): AMMONIA in the last 168 hours. Cardiac Enzymes: No results for input(s): CKTOTAL, CKMB, CKMBINDEX, TROPONINI in the last 168 hours. BNP (last 3 results) Recent Labs    06/25/24 0939  BNP 59.1   CBG: Recent Labs  Lab 06/28/24 1727 06/28/24 1845 06/28/24 2017 06/29/24 0759  GLUCAP 403* 363* 317* 146*    Time spent: 35 minutes  Signed:  Elvan Sor  [facility] 06/29/2024 10:59 AM

## 2024-06-29 NOTE — Plan of Care (Signed)
  Problem: Health Behavior/Discharge Planning: Goal: Ability to manage health-related needs will improve Outcome: Progressing   Problem: Clinical Measurements: Goal: Respiratory complications will improve Outcome: Progressing   Problem: Nutritional: Goal: Maintenance of adequate nutrition will improve Outcome: Progressing   Problem: Metabolic: Goal: Ability to maintain appropriate glucose levels will improve Outcome: Progressing

## 2024-07-03 ENCOUNTER — Telehealth: Payer: Self-pay | Admitting: Cardiology

## 2024-07-03 ENCOUNTER — Other Ambulatory Visit: Payer: Self-pay

## 2024-07-03 ENCOUNTER — Ambulatory Visit (INDEPENDENT_AMBULATORY_CARE_PROVIDER_SITE_OTHER): Payer: MEDICAID | Admitting: Internal Medicine

## 2024-07-03 ENCOUNTER — Encounter: Payer: Self-pay | Admitting: Internal Medicine

## 2024-07-03 VITALS — BP 128/82 | HR 98 | Temp 98.0°F | Resp 18 | Ht 64.0 in | Wt 340.0 lb

## 2024-07-03 DIAGNOSIS — E1165 Type 2 diabetes mellitus with hyperglycemia: Secondary | ICD-10-CM | POA: Diagnosis not present

## 2024-07-03 DIAGNOSIS — I5043 Acute on chronic combined systolic (congestive) and diastolic (congestive) heart failure: Secondary | ICD-10-CM | POA: Diagnosis not present

## 2024-07-03 DIAGNOSIS — J810 Acute pulmonary edema: Secondary | ICD-10-CM

## 2024-07-03 DIAGNOSIS — J206 Acute bronchitis due to rhinovirus: Secondary | ICD-10-CM | POA: Diagnosis not present

## 2024-07-03 DIAGNOSIS — Z09 Encounter for follow-up examination after completed treatment for conditions other than malignant neoplasm: Secondary | ICD-10-CM

## 2024-07-03 DIAGNOSIS — Z7984 Long term (current) use of oral hypoglycemic drugs: Secondary | ICD-10-CM

## 2024-07-03 DIAGNOSIS — Z1231 Encounter for screening mammogram for malignant neoplasm of breast: Secondary | ICD-10-CM

## 2024-07-03 MED ORDER — EMPAGLIFLOZIN 10 MG PO TABS: 10.0000 mg | ORAL_TABLET | Freq: Every day | ORAL | 1 refills | Status: AC

## 2024-07-03 NOTE — Patient Instructions (Signed)
Pneumococcal Conjugate Vaccine (PCV20) Injection What is this medication? PNEUMOCOCCAL CONJUGATE VACCINE (NEU mo KOK al kon ju gate vak SEEN) reduces the risk of pneumococcal disease, such as pneumonia. It does not treat pneumococcal disease. It is still possible to get pneumococcal disease after receiving this vaccine, but the symptoms may be less severe or not last as long. It works by helping your immune system learn how to fight off a future infection. This medicine may be used for other purposes; ask your health care provider or pharmacist if you have questions. COMMON BRAND NAME(S): Prevnar 20 What should I tell my care team before I take this medication? They need to know if you have any of these conditions: Bleeding disorder Fever Immune system problems An unusual or allergic reaction to pneumococcal vaccine, diphtheria toxoid, other vaccines, other medications, foods, dyes, or preservatives Pregnant or trying to get pregnant Breastfeeding How should I use this medication? This vaccine is injected into a muscle. It is given by your care team. A copy of Vaccine Information Statements will be given before each vaccination. Be sure to read this information carefully each time. This sheet may change often. Talk to your care team about the use of this medication in children. While it may be given to children as young as 6 weeks for selected conditions, precautions do apply. Overdosage: If you think you have taken too much of this medicine contact a poison control center or emergency room at once. NOTE: This medicine is only for you. Do not share this medicine with others. What if I miss a dose? This does not apply. This medication is not for regular use. What may interact with this medication? Medications for cancer chemotherapy Medications that suppress your immune function Steroid medications, such as prednisone or cortisone This list may not describe all possible interactions. Give  your health care provider a list of all the medicines, herbs, non-prescription drugs, or dietary supplements you use. Also tell them if you smoke, drink alcohol, or use illegal drugs. Some items may interact with your medicine. What should I watch for while using this medication? Visit your care team regularly. Report any side effects to your care team right away. This vaccine, like all vaccines, may not fully protect everyone. What side effects may I notice from receiving this medication? Side effects that you should report to your care team as soon as possible: Allergic reactions--skin rash, itching, hives, swelling of the face, lips, tongue, or throat Side effects that usually do not require medical attention (report these to your care team if they continue or are bothersome): Fatigue Fever Headache Joint pain Muscle pain Pain, redness, or irritation at injection site This list may not describe all possible side effects. Call your doctor for medical advice about side effects. You may report side effects to FDA at 1-800-FDA-1088. Where should I keep my medication? This vaccine is only given by your care team. It will not be stored at home. NOTE: This sheet is a summary. It may not cover all possible information. If you have questions about this medicine, talk to your doctor, pharmacist, or health care provider.  2024 Elsevier/Gold Standard (2022-01-21 00:00:00)

## 2024-07-03 NOTE — Telephone Encounter (Signed)
 Called to confirm/remind patient of their appointment at [facility] on 07/04/24.   Appointment:   [] Confirmed  [x] Left mess   [] No answer/No voice mail  [] VM Full/unable to leave message  [] Phone not in service  Patient reminded to bring all medications and/or complete list.  Confirmed patient has transportation. Gave directions, instructed to utilize valet parking.

## 2024-07-03 NOTE — Progress Notes (Signed)
 Established Patient Office Visit  Subjective   Patient ID: Krista Mcgee, female    DOB: November 27, 1975  Age: 48 y.o. MRN: [id number]  Chief Complaint  Patient presents with   Hospitalization Follow-up    HPI  Patient is here for hospital discharge follow up. This is my first time meeting her. She is here with her daughter today.   Discharge Date: 06/29/24 Diagnosis: Rhinovirus and COPD execration  Procedures/tests: Chest x-ray with no acute findings, cardiomegaly. Left Doppler negative for DVT. Repeat chest x-ray with mild pulmonary edema. Echo with EF 55-60%, grade 1 diastolic dysfunction  New medications: Prednisone  which she completed last Saturday, oxygen 2 L [place] with rest and activity  Discontinued medications: None Discharge instructions:  Follow up with PCP Status: better  Discussed the use of AI scribe software for clinical note transcription with the patient, who gave verbal consent to proceed.  History of Present Illness Krista Mcgee is a 48 year old female with COPD who presents for a hospital follow-up.  She experienced a COPD exacerbation, leading to hospitalization where she was started on oxygen therapy. Her oxygen saturation is 95% when sitting and 97% on room air. She uses Breztri  daily and albuterol  as needed, with infrequent use since discharge. She completed prednisone  on Saturday and was not given antibiotics.  Her blood sugar levels were elevated during hospitalization, attributed to steroid use. Her A1c was 6.9 in September, but fasting blood sugars were around 400 during her stay. She is currently on metformin , having stopped Jardiance  previously. Her fasting blood sugar was 198 yesterday.  She has congestive heart failure and was given Lasix  during her hospital stay. She takes carvedilol and uses Lasix  as needed, with no recent use. She manages her fluid intake to prevent heart condition exacerbation.  She was surprised by the COPD diagnosis, as she had  not experienced significant lung issues before. A pulmonologist visit a few years ago showed no significant lung function issues. Her grandmother had COPD and rheumatoid arthritis.   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 mass index (BMI) of 60.0 to 69.9 in adult (HCC) 10/19/2021   Tobacco use 10/19/2021   Hepatic steatosis 04/18/2018   Lumbar disc disease with radiculopathy 03/03/2018   Knee pain, bilateral 03/03/2018   Hip pain, chronic, right 03/03/2018   Hyperlipidemia 03/29/2017   Type 2 diabetes mellitus (HCC) 03/19/2017   Hirsutism 03/19/2017   Vitamin D  deficiency 03/10/2017   DOE (dyspnea on exertion) 03/09/2017   Insomnia 07/29/2015   Major depressive disorder, recurrent, in full remission with anxious distress 07/29/2015   Past Medical History:  Diagnosis Date   Acute on chronic respiratory failure with hypoxia (HCC) 10/19/2021   Acute respiratory failure with hypoxia (HCC) 10/20/2021   Anxiety    CHF (congestive heart failure) (HCC)    Depression    Diabetes mellitus without complication (HCC)    Diverticulosis 04/18/2018   Hypertension    Inappropriately high serum insulin  10/25/2018   Oxygen deficiency    Sleep  apnea    Viral pneumonia 10/21/2021   Past Surgical History:  Procedure Laterality Date   CHOLECYSTECTOMY     INCONTINENCE SURGERY     OOPHORECTOMY     TUBAL LIGATION     Social History   Tobacco Use   Smoking status: Former     Current packs/day: 0.00    Average packs/day: 1 pack/day for 28.0 years (28.0 ttl pk-yrs)    Types: Cigarettes    Start date: 08/12/1994    Quit date: 08/12/2022    Years since quitting: 1.8   Smokeless tobacco: Former   Tobacco comments:    Vaping with nicotine  1 year with light cirgarette smoking, prior to that 1 ppd cig smoking started smoking 48 years old so roughly 1 ppd 27-28 years   Vaping Use   Vaping status: Never Used  Substance Use Topics   Alcohol use: Yes    Comment: occasionally   Drug use: No   Social History   Socioeconomic History   Marital status: Married    Spouse name: Not on file   Number of children: Not on file   Years of education: Not on file   Highest education level: 12th grade  Occupational History   Not on file  Tobacco Use   Smoking status: Former    Current packs/day: 0.00    Average packs/day: 1 pack/day for 28.0 years (28.0 ttl pk-yrs)    Types: Cigarettes    Start date: 08/12/1994    Quit date: 08/12/2022    Years since quitting: 1.8   Smokeless tobacco: Former   Tobacco comments:    Vaping with nicotine  1 year with light cirgarette smoking, prior to that 1 ppd cig smoking started smoking 48 years old so roughly 1 ppd 27-28 years   Vaping Use   Vaping status: Never Used  Substance and Sexual Activity   Alcohol use: Yes    Comment: occasionally   Drug use: No   Sexual activity: Yes    Partners: Male    Birth control/protection: None  Other Topics Concern   Not on file  Social History Narrative   Not on file   Social Drivers of Health   Financial Resource Strain: High Risk (05/04/2024)   Overall Financial Resource Strain (CARDIA)    Difficulty of Paying Living Expenses: Hard  Food Insecurity: Food Insecurity Present (06/25/2024)   Hunger Vital Sign    Worried About Programme Researcher, Broadcasting/film/video in the Last Year: Often true    Ran Out of Food in the Last Year: Often true  Transportation Needs: Unmet Transportation Needs (06/25/2024)    PRAPARE - Transportation    Lack of Transportation (Medical): Yes    Lack of Transportation (Non-Medical): Yes  Physical Activity: Inactive (05/04/2024)   Exercise Vital Sign    Days of Exercise per Week: 0 days    Minutes of Exercise per Session: Not on file  Stress: Stress Concern Present (05/04/2024)   [employer] of Occupational Health - Occupational Stress Questionnaire    Feeling of Stress: Very much  Social Connections: Socially Isolated (05/04/2024)   Social Connection and Isolation Panel    Frequency of Communication with Friends and Family: Twice a week    Frequency of Social Gatherings with Friends and Family: Once a week    Attends Religious Services: Never    Database Administrator or Organizations: No    Attends Engineer, Structural: Not on file    Marital Status: Widowed  Intimate  Partner Violence: Not At Risk (06/25/2024)   Humiliation, Afraid, Rape, and Kick questionnaire    Fear of Current or Ex-Partner: No    Emotionally Abused: No    Physically Abused: No    Sexually Abused: No   Family Status  Relation Name Status   Mother Mother Alive   Father Father Alive   Sister  Alive   Brother  Alive   Daughter Daughter Alive   Son  Alive   MGM  Deceased   MGF  Deceased   PGM Grandmother Deceased   PGF Grandfather Deceased   Daughter  Alive   Daughter  Alive   Daughter  Alive   Son  Alive  No partnership data on file   Family History  Problem Relation Age of Onset   Cancer Mother        ovarian   Other Father        tumor in his ear drum   Diabetes Father    Heart disease Father    Hyperlipidemia Father    Hypertension Father    Kidney disease Daughter        hydronephrosis   Anxiety disorder Daughter    Arthritis Paternal Grandmother    COPD Paternal Grandmother    Emphysema Paternal Grandmother    Heart disease Paternal Grandmother    Cancer Paternal Grandfather        skin   COPD Paternal Grandfather    Emphysema Paternal  Grandfather    No Known Allergies    Review of Systems  Constitutional:  Negative for chills and fever.  Respiratory:  Negative for cough, shortness of breath and wheezing.       Objective:     BP 128/82 (Cuff Size: Large)   Pulse 98   Temp 98 F (36.7 C) (Oral)   Resp 18   Ht 5' 4 (1.626 m)   Wt (!) 340 lb (154.2 kg)   LMP 09/07/2015 (LMP Unknown) Comment: oophorectomy, no period since  SpO2 99%   BMI 58.36 kg/m  BP Readings from Last 3 Encounters:  07/03/24 128/82  06/29/24 (!) 113/55  05/04/24 130/84   Wt Readings from Last 3 Encounters:  07/03/24 (!) 340 lb (154.2 kg)  06/25/24 (!) 360 lb (163.3 kg)  05/04/24 (!) 346 lb (156.9 kg)      Physical Exam Constitutional:      Appearance: Normal appearance.  HENT:     Head: Normocephalic and atraumatic.  Eyes:     Conjunctiva/sclera: Conjunctivae normal.  Cardiovascular:     Rate and Rhythm: Normal rate and regular rhythm.  Pulmonary:     Effort: Pulmonary effort is normal.     Breath sounds: Normal breath sounds. No wheezing, rhonchi or rales.  Musculoskeletal:     Right lower leg: No edema.     Left lower leg: No edema.  Skin:    General: Skin is warm and dry.  Neurological:     General: No focal deficit present.     Mental Status: She is alert. Mental status is at baseline.  Psychiatric:        Mood and Affect: Mood normal.        Behavior: Behavior normal.      No results found for any visits on 07/03/24.  Last CBC Lab Results  Component Value Date   WBC 13.6 (H) 06/29/2024   HGB 13.2 06/29/2024   HCT 41.1 06/29/2024   MCV 90.9 06/29/2024   MCH 29.2 06/29/2024  RDW 14.5 06/29/2024   PLT 221 06/29/2024   Last metabolic panel Lab Results  Component Value Date   GLUCOSE 164 (H) 06/29/2024   NA 138 06/29/2024   K 4.2 06/29/2024   CL 102 06/29/2024   CO2 27 06/29/2024   BUN 35 (H) 06/29/2024   CREATININE 0.97 06/29/2024   GFRNONAA >60 06/29/2024   CALCIUM  8.9 06/29/2024   PHOS 4.3  06/29/2024   PROT 7.8 06/25/2024   ALBUMIN 3.6 06/25/2024   LABGLOB 2.8 07/30/2015   AGRATIO 1.4 07/30/2015   BILITOT 1.2 06/25/2024   ALKPHOS 77 06/25/2024   AST 37 06/25/2024   ALT 30 06/25/2024   ANIONGAP 9 06/29/2024   Last lipids Lab Results  Component Value Date   CHOL 207 (H) 05/04/2024   HDL 35 (L) 05/04/2024   LDLCALC 136 (H) 05/04/2024   TRIG 218 (H) 05/04/2024   CHOLHDL 5.9 (H) 05/04/2024   Last hemoglobin A1c Lab Results  Component Value Date   HGBA1C 6.9 (H) 05/04/2024   Last thyroid functions Lab Results  Component Value Date   TSH 1.37 05/04/2024   FREET4 1.1 03/09/2017   Last vitamin D  Lab Results  Component Value Date   VD25OH 17 (L) 05/04/2024   Last vitamin B12 and Folate Lab Results  Component Value Date   VITAMINB12 282 06/29/2024   FOLATE 8.4 06/29/2024      The 10-year ASCVD risk score (Arnett DK, et al., 2019) is: 4.7%    Assessment & Plan:   Assessment & Plan Hospital Discharge Follow up/Bronchitis secondary to Rhinovirus infection Recent hospitalization for COPD exacerbation with acute bronchitis. No formal COPD diagnosis. Differential includes viral bronchitis due to recent rhinovirus infection. Pulmonary function test needed for diagnosis. - Performed walk test to assess oxygen saturation without supplemental oxygen, dropped to 93% on room air after 3 minutes.  - Continue oxygen with activity as needed and recheck walk test at follow up. Not currently following with Pulmonology.  - Continue Breztri  inhaler daily and albuterol  as needed. - Provided information on pneumonia vaccine (Prevnar 20) and encouraged vaccination.  Pulmonary edema (mild, recent) Mild pulmonary edema noted on initial hospital chest x-ray. Managed with Lasix  during hospitalization. No current symptoms of fluid overload. - Continue to monitor for symptoms of fluid overload.  Type 2 diabetes mellitus Recent hyperglycemia likely due to steroid use. A1c was  6.9% in September. Currently on metformin . Jardiance  may benefit heart failure and kidney protection. - Started Jardiance  10 mg daily.  - Continue Metformin , discontinue insulin .  - Will recheck A1c in one month. - Monitor for side effects of Jardiance , including UTIs and yeast infections.  Heart failure with mildly reduced ejection fraction Heart failure with ejection fraction of 55-60%. Managed with carvedilol and Lasix  as needed. Jardiance  may provide additional cardiac protection. - Continue carvedilol and Lasix  as needed. - Monitor fluid intake and symptoms of heart failure.  General Health Maintenance Up-to-date on pneumonia vaccine as of 2019. Discussed importance of vaccinations to prevent respiratory illnesses. - Provided information on pneumonia vaccine (Prevnar 20) and encouraged vaccination. - Encouraged flu and RSV vaccinations.  - empagliflozin  (JARDIANCE ) 10 MG TABS tablet; Take 1 tablet (10 mg total) by mouth daily.  Dispense: 30 tablet; Refill: 1 - MM 3D SCREENING MAMMOGRAM BILATERAL BREAST; Future   Return in about 4 weeks (around 07/31/2024) for after 12/11 for A1c.    Sharyle Fischer, DO

## 2024-07-04 ENCOUNTER — Inpatient Hospital Stay (HOSPITAL_BASED_OUTPATIENT_CLINIC_OR_DEPARTMENT_OTHER): Payer: MEDICAID | Admitting: Cardiology

## 2024-07-04 VITALS — BP 166/88 | HR 79 | Wt 347.0 lb

## 2024-07-04 DIAGNOSIS — I5032 Chronic diastolic (congestive) heart failure: Secondary | ICD-10-CM

## 2024-07-04 DIAGNOSIS — E78019 Familial hypercholesterolemia, unspecified: Secondary | ICD-10-CM | POA: Diagnosis not present

## 2024-07-04 MED ORDER — ATORVASTATIN CALCIUM 40 MG PO TABS: 40.0000 mg | ORAL_TABLET | Freq: Every day | ORAL | 3 refills | Status: AC

## 2024-07-04 NOTE — Patient Instructions (Signed)
 Medication Changes:  Please take the following EVERY DAY:  Lasix  20 MG daily Coreg 3.125 MG two times daily Lisinopril 10 MG daily  And Atorvastatin  has been increased to 40 MG daily. A new prescription was sent to your pharmacy.    Our Pharmacist will call you to discuss starting Mounjaro.   Lab Work:  Your physician recommends that you return for lab work in: 10 days for [employer] physician recommends that you return for lab work in: 2 months for lipid/lft panel. Please return to the [facility] (Main Entrance) for lab work on or after January 9th, 2026.     Special Instructions // Education:  Do the following things EVERYDAY: Weigh yourself in the morning before breakfast. Write it down and keep it in a log. Take your medicines as prescribed Eat low salt foods--Limit salt (sodium) to 2000 mg per day.  Stay as active as you can everyday Limit all fluids for the day to less than 2 liters   Follow-Up in: 6 week follow up with Ellouise Class, NP on Friday, August 11, 2024 at 1 PM    If you have any questions or concerns before your next appointment please send us  a message through [place] or call our office at [phone], If it is after office hours your call will be answered by our answering service and directed appropriately.     At the [facility], you and your health needs are our priority. We have a designated team specialized in the treatment of Heart Failure. This Care Team includes your primary Heart Failure Specialized Cardiologist (physician), Advanced Practice Providers (APPs- Physician Assistants and Nurse Practitioners), and Pharmacist who all work together to provide you with the care you need, when you need it.   You may see any of the following providers on your designated Care Team at your next follow up:  Dr. Toribio Fuel Dr. Ezra Shuck Dr. Ria Commander Dr. Odis Brownie Greig Mosses, NP Caffie Shed, [place] [id number] [place], [place] Beckey Coe, NP Jordan Lee, NP Ellouise Class, NP Jaun Bash, PharmD

## 2024-07-04 NOTE — Progress Notes (Signed)
 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 female with a history significant for morbid obesity, hypertension, type II DM, depression, anxiety.  Admitted 2/26-10/23/21 with acute on chronic respiratory failure, requiring BiPAP, found to have viral PNA. Echo in 2/23 showed EF 55 to 60%.   She has been followed in CHF clinic by Ellouise Class, NP.    She was admitted in 11/25 with COPD exacerbation triggered by rhinovirus.  She was treated with steroids, nebs, IV Lasix  for component of volume overload. She quit smoking in 11/25 after this admission.   Echo in 11/25 showed EF 55-60%, grade I diastolic function, normal RV.    Patient is doing better since getting home from the hospital.  She is using Lasix  only prn.  Her legs are swelling some, but has lymphedema.  She is also only taking her Coreg and lisinopril when BP is elevated.  BP is 166/88 today, she did not take Coreg or lisinopril today b/c BP was not elevated this morning.  She is no longer wheezing.  She is short of breath with fast walking or bending over.  No chest pain.  No dyspnea walking around her house.  No PND/orthopnea.   ECG (personally reviewed, 11/25): NSR, poor RWP  Labs (9/25): LDL 136 Labs (11/25): K 4.2, creatinine 0.97, BNP 59  PMH: 1. Obesity 2. HFpEF: Most recent echo in 11/25 with EF 55-60%, grade I diastolic function, normal RV. 3. HTN 4. Type 2 diabetes 5. Depression 6. H/o cholecystectomy 7. COPD: Smoker until 11/25.  8. OSA: Uses BIPAP.   Past Surgical History:  Procedure Laterality Date   CHOLECYSTECTOMY     INCONTINENCE SURGERY     OOPHORECTOMY     TUBAL LIGATION     Family History  Problem Relation Age of Onset   Cancer Mother        ovarian   Other Father        tumor in his ear drum   Diabetes Father    Heart disease Father    Hyperlipidemia Father    Hypertension Father    Kidney disease Daughter        hydronephrosis   Anxiety disorder Daughter     Arthritis Paternal Grandmother    COPD Paternal Grandmother    Emphysema Paternal Grandmother    Heart disease Paternal Grandmother    Cancer Paternal Grandfather        skin   COPD Paternal Grandfather    Emphysema Paternal Grandfather    Social History   Tobacco Use   Smoking status: Former    Current packs/day: 0.00    Average packs/day: 1 pack/day for 28.0 years (28.0 ttl pk-yrs)    Types: Cigarettes    Start date: 08/12/1994    Quit date: 08/12/2022    Years since quitting: 1.8   Smokeless tobacco: Former   Tobacco comments:    Vaping with nicotine  1 year with light cirgarette smoking, prior to that 1 ppd cig smoking started smoking 48 years old so roughly 1 ppd 27-28 years   Substance Use Topics   Alcohol use: Yes    Comment: occasionally   No Known Allergies  ROS: All systems reviewed and negative except as per HPI.   Current Outpatient Medications  Medication Sig Dispense Refill   Accu-Chek Softclix Lancets lancets Use to test blood sugar up to 4 times daily 100 each 0   albuterol  (VENTOLIN  HFA) 108 (90 Base) MCG/ACT inhaler Inhale  1-2 puffs into the lungs every 6 (six) hours as needed for wheezing or shortness of breath. 18 g 2   ALPRAZolam  (XANAX ) 1 MG tablet [facility]  2   atorvastatin  (LIPITOR) 40 MG tablet Take 1 tablet (40 mg total) by mouth daily. 90 tablet 3   Baclofen  5 MG TABS Take 1 tablet (5 mg total) by mouth 3 (three) times daily as needed (msk pain or spasms). Can be sedating, do not take with other sedating medication 90 tablet 3   Blood Glucose Monitoring Suppl (ACCU-CHEK GUIDE ME) w/Device KIT Use to check blood sugar up to 4 times daily 1 kit 0   budesonide -glycopyrrolate-formoterol  (BREZTRI  AEROSPHERE) 160-9-4.8 MCG/ACT AERO inhaler Inhale 2 puffs into the lungs in the morning and at bedtime. 10.7 g 11   buPROPion  (WELLBUTRIN  XL) 300 MG 24 hr tablet Take 300 mg by mouth every morning.  2   carvedilol (COREG) 3.125 MG tablet Take 1 tablet  (3.125 mg total) by mouth 2 (two) times daily with a meal. Hold if systolic BP <110 mmHg and or heart rate <65 60 tablet 11   empagliflozin  (JARDIANCE ) 10 MG TABS tablet Take 1 tablet (10 mg total) by mouth daily. 30 tablet 1   folic acid (FOLVITE) 1 MG tablet Take 1 tablet (1 mg total) by mouth daily. 30 tablet 0   furosemide  (LASIX ) 20 MG tablet Take 1 tablet (20 mg total) by mouth in the morning. 90 tablet 1   Glucose Blood (BLOOD GLUCOSE TEST STRIPS) STRP 1 each by Does not apply route as directed. Dispense based on patient and insurance preference. Use up to four times daily as directed. (FOR ICD-10 E10.9, E11.9). 100 strip 0   Lancets Misc. (ACCU-CHEK SOFTCLIX LANCET DEV) KIT Use up to 4 times daily to monitor blood sugar 1 kit 0   lisinopril (ZESTRIL) 10 MG tablet Take 1 tablet (10 mg total) by mouth daily. Hold if systolic BP less than 120 30 tablet 11   metFORMIN  (GLUCOPHAGE ) 500 MG tablet Take 1 tablet (500 mg total) by mouth 2 (two) times daily with a meal. 60 tablet 11   pregabalin  (LYRICA ) 25 MG capsule Take 1 capsule (25 mg total) by mouth 2 (two) times daily. 180 capsule 0   Vitamin D , Ergocalciferol , (DRISDOL ) 1.25 MG (50000 UNIT) CAPS capsule Take 1 capsule (50,000 Units total) by mouth every 7 (seven) days. x12 weeks. 12 capsule 1   VRAYLAR  6 MG CAPS Take 1 capsule by mouth daily.     zolpidem  (AMBIEN ) 10 MG tablet Take 10 mg by mouth at bedtime as needed.     chlorpheniramine-HYDROcodone  (TUSSIONEX) 10-8 MG/5ML Take 5 mLs by mouth every 12 (twelve) hours as needed for cough. (Patient not taking: Reported on 07/03/2024) 70 mL 0   guaiFENesin  (MUCINEX ) 600 MG 12 hr tablet Take 1 tablet (600 mg total) by mouth 2 (two) times daily for 7 days. (Patient not taking: Reported on 07/03/2024) 14 tablet 0   No current facility-administered medications for this visit.     Vitals:   07/04/24 1416  BP: (!) 166/88  Pulse: 79  SpO2: 96%  Weight: (!) 347 lb (157.4 kg)   Wt Readings from  Last 3 Encounters:  07/04/24 (!) 347 lb (157.4 kg)  07/03/24 (!) 340 lb (154.2 kg)  06/25/24 (!) 360 lb (163.3 kg)  General: NAD, obese.  Neck: Thick. Probably no JVD, no thyromegaly or thyroid nodule.  Lungs: Clear to auscultation bilaterally with normal respiratory effort. CV:  Nondisplaced PMI.  Heart regular S1/S2, no S3/S4, no murmur.  1+ pitting ankle edema.  No carotid bruit.  Normal pedal pulses.  Abdomen: Soft, nontender, no hepatosplenomegaly, no distention.  Skin: Intact without lesions or rashes.  Neurologic: Alert and oriented x 3.  Psych: Normal affect. Extremities: No clubbing or cyanosis. Lower extremity lymphedema.  HEENT: Normal.   Assessment and Plan:  1. HFpEF: Echo in 11/25 showed EF 55-60%, grade I diastolic function, normal RV. This is complicated by morbid obesity. NYHA class II currently, probably mild volume overload on exam.  - She should take Lasix  20 mg daily rather than prn. BMET/BNP in 10 days.  - Continue Jardiance  10 mg daily.  2. HTN: BP not adequately controlled.  I do not think it is a good idea to take BP meds prn.  - Take Coreg 3.125 mg bid.  - Take lisinopril 10 mg daily.  3. COPD: She has quit smoking.   - Followup with pulmonary.  4. Hyperlipidemia: LDL 136 in 9/25.  - I will increase atorvastatin  to 80 mg daily with lipids/LFTs in 2 months.  5. OSA: Continue Bipap.  6. Obesity: I will ask our pharmacist to look into Mounjaro for her.   Followup in 6 wks with NP [facility].   I spent 31 minutes reviewing records, interviewing/examining patient, and managing orders.   Ezra Shuck 07/04/2024

## 2024-07-05 ENCOUNTER — Telehealth (HOSPITAL_COMMUNITY): Payer: Self-pay | Admitting: Pharmacist

## 2024-07-05 ENCOUNTER — Other Ambulatory Visit (HOSPITAL_COMMUNITY): Payer: Self-pay

## 2024-07-05 MED ORDER — MOUNJARO 2.5 MG/0.5ML ~~LOC~~ SOAJ: 2.5000 mg | SUBCUTANEOUS | 0 refills | Status: DC

## 2024-07-05 NOTE — Telephone Encounter (Signed)
 [employer] insurance covers Mounjaro for $81.73 for 28 days. This insurance is eligible for copay card.

## 2024-07-10 ENCOUNTER — Inpatient Hospital Stay: Payer: MEDICAID | Admitting: Family

## 2024-08-01 MED ORDER — MOUNJARO 5 MG/0.5ML ~~LOC~~ SOAJ: 5.0000 mg | SUBCUTANEOUS | 0 refills | Status: AC

## 2024-08-01 NOTE — Telephone Encounter (Signed)
 Mounjaro  increased to 5 mg SQ weekly.

## 2024-08-01 NOTE — Addendum Note (Signed)
 Addended by: DELSIE JAUN RAMAN on: 08/01/2024 11:14 AM   Modules accepted: Orders

## 2024-08-10 ENCOUNTER — Ambulatory Visit: Payer: MEDICAID | Admitting: Internal Medicine

## 2024-08-10 ENCOUNTER — Telehealth: Payer: Self-pay | Admitting: Family

## 2024-08-10 NOTE — Telephone Encounter (Signed)
 Called to confirm/remind patient of their appointment at [facility] on 08/11/24.   Appointment:   [] Confirmed  [x] Left mess   [] No answer/No voice mail  [] VM Full/unable to leave message  [] Phone not in service  Patient reminded to bring all medications and/or complete list.  Confirmed patient has transportation. Gave directions, instructed to utilize valet parking.

## 2024-08-11 ENCOUNTER — Telehealth: Payer: Self-pay | Admitting: Family

## 2024-08-11 ENCOUNTER — Ambulatory Visit: Payer: MEDICAID | Admitting: Family

## 2024-08-11 NOTE — Telephone Encounter (Signed)
 Patient did not show for her [facility] appointment on 08/11/24.

## 2024-08-11 NOTE — Progress Notes (Deleted)
 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 female with a history significant for morbid obesity, hypertension, type II DM, depression, anxiety.  Admitted 2/26-10/23/21 with acute on chronic respiratory failure, requiring BiPAP, found to have viral PNA. Echo in 2/23 showed EF 55 to 60%.   She has been followed in CHF clinic by Krista Class, NP.    She was admitted in 11/25 with COPD exacerbation triggered by rhinovirus.  She was treated with steroids, nebs, IV Lasix  for component of volume overload. She quit smoking in 11/25 after this admission.   Echo in 11/25 showed EF 55-60%, grade I diastolic function, normal RV.    Patient is doing better since getting home from the hospital.  She is using Lasix  only prn.  Her legs are swelling some, but has lymphedema.  She is also only taking her Coreg  and lisinopril  when BP is elevated.  BP is 166/88 today, she did not take Coreg  or lisinopril  today b/c BP was not elevated this morning.  She is no longer wheezing.  She is short of breath with fast walking or bending over.  No chest pain.  No dyspnea walking around her house.  No PND/orthopnea.   ECG (personally reviewed, 11/25): NSR, poor RWP  Labs (9/25): LDL 136 Labs (11/25): K 4.2, creatinine 0.97, BNP 59  PMH: 1. Obesity 2. HFpEF: Most recent echo in 11/25 with EF 55-60%, grade I diastolic function, normal RV. 3. HTN 4. Type 2 diabetes 5. Depression 6. H/o cholecystectomy 7. COPD: Smoker until 11/25.  8. OSA: Uses BIPAP.   Past Surgical History:  Procedure Laterality Date   CHOLECYSTECTOMY     INCONTINENCE SURGERY     OOPHORECTOMY     TUBAL LIGATION     Family History  Problem Relation Age of Onset   Cancer Mother        ovarian   Other Father        tumor in his ear drum   Diabetes Father    Heart disease Father    Hyperlipidemia Father    Hypertension Father    Kidney disease Daughter         hydronephrosis   Anxiety disorder Daughter    Arthritis Paternal Grandmother    COPD Paternal Grandmother    Emphysema Paternal Grandmother    Heart disease Paternal Grandmother    Cancer Paternal Grandfather        skin   COPD Paternal Grandfather    Emphysema Paternal Grandfather    Social History   Tobacco Use   Smoking status: Former    Current packs/day: 0.00    Average packs/day: 1 pack/day for 28.0 years (28.0 ttl pk-yrs)    Types: Cigarettes    Start date: 08/12/1994    Quit date: 08/12/2022    Years since quitting: 2.0   Smokeless tobacco: Former   Tobacco comments:    Vaping with nicotine  1 year with light cirgarette smoking, prior to that 1 ppd cig smoking started smoking 48 years old so roughly 1 ppd 27-28 years   Substance Use Topics   Alcohol use: Yes    Comment: occasionally   No Known Allergies  ROS: All systems reviewed and negative except as per HPI.   Current Outpatient Medications  Medication Sig Dispense Refill   Accu-Chek Softclix Lancets lancets Use to test blood sugar up to 4 times daily 100 each 0  albuterol  (VENTOLIN  HFA) 108 (90 Base) MCG/ACT inhaler Inhale 1-2 puffs into the lungs every 6 (six) hours as needed for wheezing or shortness of breath. 18 g 2   ALPRAZolam  (XANAX ) 1 MG tablet [facility]  2   atorvastatin  (LIPITOR) 40 MG tablet Take 1 tablet (40 mg total) by mouth daily. 90 tablet 3   Baclofen  5 MG TABS Take 1 tablet (5 mg total) by mouth 3 (three) times daily as needed (msk pain or spasms). Can be sedating, do not take with other sedating medication 90 tablet 3   Blood Glucose Monitoring Suppl (ACCU-CHEK GUIDE ME) w/Device KIT Use to check blood sugar up to 4 times daily 1 kit 0   budesonide -glycopyrrolate -formoterol  (BREZTRI  AEROSPHERE) 160-9-4.8 MCG/ACT AERO inhaler Inhale 2 puffs into the lungs in the morning and at bedtime. 10.7 g 11   buPROPion  (WELLBUTRIN  XL) 300 MG 24 hr tablet Take 300 mg by mouth every morning.  2    carvedilol  (COREG ) 3.125 MG tablet Take 1 tablet (3.125 mg total) by mouth 2 (two) times daily with a meal. Hold if systolic BP <110 mmHg and or heart rate <65 60 tablet 11   chlorpheniramine-HYDROcodone  (TUSSIONEX) 10-8 MG/5ML Take 5 mLs by mouth every 12 (twelve) hours as needed for cough. (Patient not taking: Reported on 07/03/2024) 70 mL 0   empagliflozin  (JARDIANCE ) 10 MG TABS tablet Take 1 tablet (10 mg total) by mouth daily. 30 tablet 1   furosemide  (LASIX ) 20 MG tablet Take 1 tablet (20 mg total) by mouth in the morning. 90 tablet 1   Glucose Blood (BLOOD GLUCOSE TEST STRIPS) STRP 1 each by Does not apply route as directed. Dispense based on patient and insurance preference. Use up to four times daily as directed. (FOR ICD-10 E10.9, E11.9). 100 strip 0   Lancets Misc. (ACCU-CHEK SOFTCLIX LANCET DEV) KIT Use up to 4 times daily to monitor blood sugar 1 kit 0   lisinopril  (ZESTRIL ) 10 MG tablet Take 1 tablet (10 mg total) by mouth daily. Hold if systolic BP less than 120 30 tablet 11   metFORMIN  (GLUCOPHAGE ) 500 MG tablet Take 1 tablet (500 mg total) by mouth 2 (two) times daily with a meal. 60 tablet 11   pregabalin  (LYRICA ) 25 MG capsule Take 1 capsule (25 mg total) by mouth 2 (two) times daily. 180 capsule 0   tirzepatide  (MOUNJARO ) 5 MG/0.5ML Pen Inject 5 mg into the skin once a week. 2 mL 0   Vitamin D , Ergocalciferol , (DRISDOL ) 1.25 MG (50000 UNIT) CAPS capsule Take 1 capsule (50,000 Units total) by mouth every 7 (seven) days. x12 weeks. 12 capsule 1   VRAYLAR  6 MG CAPS Take 1 capsule by mouth daily.     zolpidem  (AMBIEN ) 10 MG tablet Take 10 mg by mouth at bedtime as needed.     No current facility-administered medications for this visit.     There were no vitals filed for this visit.  Wt Readings from Last 3 Encounters:  07/04/24 (!) 347 lb (157.4 kg)  07/03/24 (!) 340 lb (154.2 kg)  06/25/24 (!) 360 lb (163.3 kg)  General: NAD, obese.  Neck: Thick. Probably no JVD, no  thyromegaly or thyroid nodule.  Lungs: Clear to auscultation bilaterally with normal respiratory effort. CV: Nondisplaced PMI.  Heart regular S1/S2, no S3/S4, no murmur.  1+ pitting ankle edema.  No carotid bruit.  Normal pedal pulses.  Abdomen: Soft, nontender, no hepatosplenomegaly, no distention.  Skin: Intact without lesions or rashes.  Neurologic:  Alert and oriented x 3.  Psych: Normal affect. Extremities: No clubbing or cyanosis. Lower extremity lymphedema.  HEENT: Normal.   Assessment and Plan:  1. HFpEF: Echo in 11/25 showed EF 55-60%, grade I diastolic function, normal RV. This is complicated by morbid obesity. NYHA Mcgee II currently, probably mild volume overload on exam.  - She should take Lasix  20 mg daily rather than prn. BMET/BNP in 10 days.  - Continue Jardiance  10 mg daily.  2. HTN: BP not adequately controlled.  I do not think it is a good idea to take BP meds prn.  - Take Coreg  3.125 mg bid.  - Take lisinopril  10 mg daily.  3. COPD: She has quit smoking.   - Followup with pulmonary.  4. Hyperlipidemia: LDL 136 in 9/25.  - I will increase atorvastatin  to 80 mg daily with lipids/LFTs in 2 months.  5. OSA: Continue Bipap.  6. Obesity: I will ask our pharmacist to look into Mounjaro  for her.   Followup in 6 wks with NP [facility].   I spent 31 minutes reviewing records, interviewing/examining patient, and managing orders.   Krista Mcgee 08/11/2024       Krista DELENA Class, FNP 08/11/2024  "
